# Patient Record
Sex: Male | Born: 1965 | Race: White | Hispanic: No | Marital: Married | Smoking: Current every day smoker
Health system: Southern US, Community
[De-identification: ages and names within clinical notes are randomized; demographics above are authoritative.]

## PROBLEM LIST (undated history)

## (undated) DIAGNOSIS — M1990 Unspecified osteoarthritis, unspecified site: Secondary | ICD-10-CM

## (undated) DIAGNOSIS — R7989 Other specified abnormal findings of blood chemistry: Secondary | ICD-10-CM

## (undated) DIAGNOSIS — E669 Obesity, unspecified: Secondary | ICD-10-CM

## (undated) DIAGNOSIS — E11621 Type 2 diabetes mellitus with foot ulcer: Secondary | ICD-10-CM

## (undated) DIAGNOSIS — E785 Hyperlipidemia, unspecified: Secondary | ICD-10-CM

## (undated) DIAGNOSIS — F17200 Nicotine dependence, unspecified, uncomplicated: Secondary | ICD-10-CM

## (undated) DIAGNOSIS — J189 Pneumonia, unspecified organism: Secondary | ICD-10-CM

## (undated) DIAGNOSIS — R55 Syncope and collapse: Secondary | ICD-10-CM

## (undated) DIAGNOSIS — I10 Essential (primary) hypertension: Secondary | ICD-10-CM

## (undated) DIAGNOSIS — I82890 Acute embolism and thrombosis of other specified veins: Secondary | ICD-10-CM

## (undated) DIAGNOSIS — L97909 Non-pressure chronic ulcer of unspecified part of unspecified lower leg with unspecified severity: Secondary | ICD-10-CM

## (undated) DIAGNOSIS — IMO0002 Reserved for concepts with insufficient information to code with codable children: Secondary | ICD-10-CM

## (undated) DIAGNOSIS — E1165 Type 2 diabetes mellitus with hyperglycemia: Secondary | ICD-10-CM

## (undated) DIAGNOSIS — I83209 Varicose veins of unspecified lower extremity with both ulcer of unspecified site and inflammation: Secondary | ICD-10-CM

## (undated) DIAGNOSIS — L03115 Cellulitis of right lower limb: Secondary | ICD-10-CM

## (undated) DIAGNOSIS — I8390 Asymptomatic varicose veins of unspecified lower extremity: Secondary | ICD-10-CM

## (undated) DIAGNOSIS — L97509 Non-pressure chronic ulcer of other part of unspecified foot with unspecified severity: Secondary | ICD-10-CM

## (undated) HISTORY — DX: Non-pressure chronic ulcer of unspecified part of unspecified lower leg with unspecified severity: L97.909

## (undated) HISTORY — DX: Non-pressure chronic ulcer of unspecified part of unspecified lower leg with unspecified severity: I83.209

---

## 1986-10-14 HISTORY — PX: APPENDECTOMY: SHX54

## 2001-09-26 ENCOUNTER — Emergency Department (HOSPITAL_COMMUNITY): Admission: EM | Admit: 2001-09-26 | Discharge: 2001-09-26 | Payer: Self-pay | Admitting: Emergency Medicine

## 2001-11-16 ENCOUNTER — Inpatient Hospital Stay (HOSPITAL_COMMUNITY): Admission: EM | Admit: 2001-11-16 | Discharge: 2001-11-18 | Payer: Self-pay | Admitting: *Deleted

## 2001-11-16 ENCOUNTER — Encounter: Payer: Self-pay | Admitting: *Deleted

## 2002-04-03 ENCOUNTER — Emergency Department (HOSPITAL_COMMUNITY): Admission: EM | Admit: 2002-04-03 | Discharge: 2002-04-03 | Payer: Self-pay | Admitting: Emergency Medicine

## 2002-04-04 ENCOUNTER — Emergency Department (HOSPITAL_COMMUNITY): Admission: EM | Admit: 2002-04-04 | Discharge: 2002-04-04 | Payer: Self-pay | Admitting: Emergency Medicine

## 2002-05-28 ENCOUNTER — Encounter: Payer: Self-pay | Admitting: Emergency Medicine

## 2002-05-29 ENCOUNTER — Inpatient Hospital Stay (HOSPITAL_COMMUNITY): Admission: EM | Admit: 2002-05-29 | Discharge: 2002-05-31 | Payer: Self-pay | Admitting: Emergency Medicine

## 2002-05-29 ENCOUNTER — Encounter: Payer: Self-pay | Admitting: Emergency Medicine

## 2002-12-17 ENCOUNTER — Encounter: Payer: Self-pay | Admitting: *Deleted

## 2002-12-17 ENCOUNTER — Emergency Department (HOSPITAL_COMMUNITY): Admission: EM | Admit: 2002-12-17 | Discharge: 2002-12-18 | Payer: Self-pay | Admitting: Emergency Medicine

## 2003-01-21 ENCOUNTER — Emergency Department (HOSPITAL_COMMUNITY): Admission: EM | Admit: 2003-01-21 | Discharge: 2003-01-21 | Payer: Self-pay | Admitting: Emergency Medicine

## 2003-01-21 ENCOUNTER — Encounter: Payer: Self-pay | Admitting: Emergency Medicine

## 2005-04-09 ENCOUNTER — Emergency Department (HOSPITAL_COMMUNITY): Admission: EM | Admit: 2005-04-09 | Discharge: 2005-04-09 | Payer: Self-pay | Admitting: Emergency Medicine

## 2005-07-17 ENCOUNTER — Emergency Department (HOSPITAL_COMMUNITY): Admission: EM | Admit: 2005-07-17 | Discharge: 2005-07-17 | Payer: Self-pay | Admitting: Emergency Medicine

## 2006-03-24 ENCOUNTER — Emergency Department (HOSPITAL_COMMUNITY): Admission: EM | Admit: 2006-03-24 | Discharge: 2006-03-24 | Payer: Self-pay | Admitting: Emergency Medicine

## 2006-03-24 ENCOUNTER — Encounter: Payer: Self-pay | Admitting: Vascular Surgery

## 2006-08-04 ENCOUNTER — Emergency Department (HOSPITAL_COMMUNITY): Admission: EM | Admit: 2006-08-04 | Discharge: 2006-08-04 | Payer: Self-pay | Admitting: *Deleted

## 2008-03-24 ENCOUNTER — Emergency Department (HOSPITAL_COMMUNITY): Admission: EM | Admit: 2008-03-24 | Discharge: 2008-03-24 | Payer: Self-pay | Admitting: Internal Medicine

## 2008-05-13 ENCOUNTER — Encounter: Admission: RE | Admit: 2008-05-13 | Discharge: 2008-05-13 | Payer: Self-pay | Admitting: Occupational Medicine

## 2008-08-24 ENCOUNTER — Ambulatory Visit: Payer: Self-pay | Admitting: Family Medicine

## 2008-08-24 ENCOUNTER — Inpatient Hospital Stay (HOSPITAL_COMMUNITY): Admission: EM | Admit: 2008-08-24 | Discharge: 2008-08-27 | Payer: Self-pay | Admitting: Emergency Medicine

## 2008-12-30 ENCOUNTER — Emergency Department (HOSPITAL_BASED_OUTPATIENT_CLINIC_OR_DEPARTMENT_OTHER): Admission: EM | Admit: 2008-12-30 | Discharge: 2008-12-30 | Payer: Self-pay | Admitting: Emergency Medicine

## 2009-01-02 ENCOUNTER — Emergency Department (HOSPITAL_COMMUNITY): Admission: EM | Admit: 2009-01-02 | Discharge: 2009-01-02 | Payer: Self-pay | Admitting: Emergency Medicine

## 2010-09-04 ENCOUNTER — Emergency Department (HOSPITAL_COMMUNITY): Admission: EM | Admit: 2010-09-04 | Discharge: 2010-09-04 | Payer: Self-pay | Admitting: Emergency Medicine

## 2010-09-10 ENCOUNTER — Inpatient Hospital Stay (HOSPITAL_COMMUNITY): Admission: EM | Admit: 2010-09-10 | Discharge: 2010-09-13 | Payer: Self-pay | Admitting: Emergency Medicine

## 2010-09-28 ENCOUNTER — Ambulatory Visit: Payer: Self-pay

## 2010-12-24 LAB — CBC
MCH: 28.9 pg (ref 26.0–34.0)
MCHC: 33.2 g/dL (ref 30.0–36.0)
Platelets: 254 10*3/uL (ref 150–400)

## 2010-12-24 LAB — GLUCOSE, CAPILLARY: Glucose-Capillary: 208 mg/dL — ABNORMAL HIGH (ref 70–99)

## 2010-12-25 LAB — GLUCOSE, CAPILLARY
Glucose-Capillary: 173 mg/dL — ABNORMAL HIGH (ref 70–99)
Glucose-Capillary: 204 mg/dL — ABNORMAL HIGH (ref 70–99)
Glucose-Capillary: 231 mg/dL — ABNORMAL HIGH (ref 70–99)
Glucose-Capillary: 249 mg/dL — ABNORMAL HIGH (ref 70–99)
Glucose-Capillary: 256 mg/dL — ABNORMAL HIGH (ref 70–99)
Glucose-Capillary: 275 mg/dL — ABNORMAL HIGH (ref 70–99)
Glucose-Capillary: 291 mg/dL — ABNORMAL HIGH (ref 70–99)
Glucose-Capillary: 319 mg/dL — ABNORMAL HIGH (ref 70–99)

## 2010-12-25 LAB — BASIC METABOLIC PANEL
BUN: 10 mg/dL (ref 6–23)
CO2: 29 mEq/L (ref 19–32)
Chloride: 101 mEq/L (ref 96–112)
Creatinine, Ser: 0.78 mg/dL (ref 0.4–1.5)
GFR calc non Af Amer: >60 mL/min (ref >60)
GFR calc non Af Amer: >60 mL/min (ref >60)
Glucose, Bld: 263 mg/dL — ABNORMAL HIGH (ref 70–99)
Glucose, Bld: 318 mg/dL — ABNORMAL HIGH (ref 70–99)
Potassium: 4.3 mEq/L (ref 3.5–5.1)
Sodium: 137 mEq/L (ref 135–145)

## 2010-12-25 LAB — CULTURE, BLOOD (ROUTINE X 2)
Culture  Setup Time: 201111282144
Culture: NO GROWTH

## 2010-12-25 LAB — CBC
HCT: 43 % (ref 39.0–52.0)
Hemoglobin: 13.5 g/dL (ref 13.0–17.0)
Hemoglobin: 15.1 g/dL (ref 13.0–17.0)
Hemoglobin: 15.4 g/dL (ref 13.0–17.0)
MCH: 29.3 pg (ref 26.0–34.0)
MCH: 30.6 pg (ref 26.0–34.0)
MCH: 30.6 pg (ref 26.0–34.0)
MCHC: 33.7 g/dL (ref 30.0–36.0)
MCHC: 35.1 g/dL (ref 30.0–36.0)
MCHC: 35.4 g/dL (ref 30.0–36.0)
MCV: 87.4 fL (ref 78.0–100.0)
Platelets: 256 10*3/uL (ref 150–400)
Platelets: 266 10*3/uL (ref 150–400)
RBC: 4.6 MIL/uL (ref 4.22–5.81)
RDW: 13.7 % (ref 11.5–15.5)
WBC: 10 10*3/uL (ref 4.0–10.5)
WBC: 9.9 10*3/uL (ref 4.0–10.5)

## 2010-12-25 LAB — HIV ANTIBODY (ROUTINE TESTING W REFLEX): HIV: NONREACTIVE

## 2010-12-25 LAB — COMPREHENSIVE METABOLIC PANEL
Alkaline Phosphatase: 65 U/L (ref 39–117)
BUN: 8 mg/dL (ref 6–23)
CO2: 28 mEq/L (ref 19–32)
Chloride: 100 mEq/L (ref 96–112)
GFR calc non Af Amer: >60 mL/min (ref >60)
Glucose, Bld: 277 mg/dL — ABNORMAL HIGH (ref 70–99)
Potassium: 3.9 mEq/L (ref 3.5–5.1)
Total Bilirubin: 0.5 mg/dL (ref 0.3–1.2)

## 2010-12-25 LAB — DIFFERENTIAL
Basophils Relative: 0 % (ref 0–1)
Basophils Relative: 0 % (ref 0–1)
Eosinophils Absolute: 0.4 10*3/uL (ref 0.0–0.7)
Eosinophils Relative: 3 % (ref 0–5)
Monocytes Absolute: 0.7 10*3/uL (ref 0.1–1.0)
Monocytes Absolute: 0.8 10*3/uL (ref 0.1–1.0)
Monocytes Relative: 5 % (ref 3–12)
Monocytes Relative: 7 % (ref 3–12)
Neutro Abs: 8.2 10*3/uL — ABNORMAL HIGH (ref 1.7–7.7)
Neutrophils Relative %: 70 % (ref 43–77)

## 2010-12-25 LAB — RAPID URINE DRUG SCREEN, HOSP PERFORMED
Amphetamines: NOT DETECTED
Opiates: POSITIVE — AB
Tetrahydrocannabinol: NOT DETECTED

## 2010-12-25 LAB — LIPID PANEL
HDL: 32 mg/dL — ABNORMAL LOW (ref >39)
VLDL: 67 mg/dL — ABNORMAL HIGH (ref 0–40)

## 2010-12-25 LAB — T3, FREE: T3, Free: 2.5 pg/mL (ref 2.3–4.2)

## 2010-12-25 LAB — TSH: TSH: 4.593 u[IU]/mL — ABNORMAL HIGH (ref 0.350–4.500)

## 2010-12-25 LAB — ETHANOL: Alcohol, Ethyl (B): <5 mg/dL (ref 0–10)

## 2011-01-24 LAB — DIFFERENTIAL
Basophils Relative: 0 % (ref 0–1)
Eosinophils Absolute: 0.2 10*3/uL (ref 0.0–0.7)
Eosinophils Relative: 2 % (ref 0–5)
Lymphs Abs: 2.6 10*3/uL (ref 0.7–4.0)
Neutrophils Relative %: 73 % (ref 43–77)

## 2011-01-24 LAB — CBC
Hemoglobin: 12.2 g/dL — ABNORMAL LOW (ref 13.0–17.0)
MCHC: 35 g/dL (ref 30.0–36.0)
RBC: 3.98 MIL/uL — ABNORMAL LOW (ref 4.22–5.81)
WBC: 12.6 10*3/uL — ABNORMAL HIGH (ref 4.0–10.5)

## 2011-01-24 LAB — GLUCOSE, CAPILLARY: Glucose-Capillary: 260 mg/dL — ABNORMAL HIGH (ref 70–99)

## 2011-01-24 LAB — COMPREHENSIVE METABOLIC PANEL
ALT: 24 U/L (ref 0–53)
AST: 19 U/L (ref 0–37)
Alkaline Phosphatase: 90 U/L (ref 39–117)
CO2: 28 mEq/L (ref 19–32)
Chloride: 106 mEq/L (ref 96–112)
GFR calc Af Amer: >60 mL/min (ref >60)
GFR calc non Af Amer: >60 mL/min (ref >60)
Glucose, Bld: 269 mg/dL — ABNORMAL HIGH (ref 70–99)
Potassium: 4.4 mEq/L (ref 3.5–5.1)
Sodium: 141 mEq/L (ref 135–145)
Total Bilirubin: 0.4 mg/dL (ref 0.3–1.2)

## 2011-02-26 NOTE — H&P (Signed)
Cody Harper, Cody Harper             ACCOUNT NO.:  [id number]   MEDICAL RECORD NO.:  [id number]          PATIENT TYPE:  INP   LOCATION:  5012                         FACILITY:  [facility]   PHYSICIAN:  Paula Compton, MD        DATE OF BIRTH:  [birth year]   DATE OF ADMISSION:  08/24/2008  DATE OF DISCHARGE:                              HISTORY & PHYSICAL   PRIMARY CARE PHYSICIAN:  None.   CHIEF COMPLAINT:  Right lower extremity wound and pain times one month.   HISTORY OF PRESENT ILLNESS:  The patient is a 45 year old male who  presents now with right lower extremity pain and chronic wound over the  past month.  The patient did not have any significant past medical  history and does not follow up with a primary care Cody Harper regularly  and endorses no medical problems.  The patient states that he sustained  minor trauma to his right lower extremity while mowing about one month  ago.  He thinks that a rock or an acorn or some other sort of debris hit  him in the leg and left a small wound.  He did not treat the wound, and  over the past month has become progressively more painful and ulcerated  with erythema and edema.  This has been progressive and has not acutely  worsened, but the sum total of his symptoms compelled the patient to  present to the emergency department today for evaluation.  The patient  states that he has been ambulating and going to work normally.   REVIEW OF SYSTEMS:  The patient reports that he has been tolerating oral  intake without difficulty.  He denies any nausea or vomiting.  Denies  any fevers or chills.  He reports normal bowel and bladder function.  He  states that he has never had a wound like this before, and he denies any  history of chronic lower extremity swelling.   ALLERGIES:  No known drug allergies.   PAST MEDICAL HISTORY:  None for the patient.  He is a half pack per day  smoker times 30 years.   FAMILY HISTORY:  The patient reports that his mom  died of breast cancer.  The patient denies any family history of diabetes, hypertension,  coronary artery disease.   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  The patient lives with his wife, 39-year-old son and father-in-law.  The  patient denies any alcohol.  Denies any drug use.  He smokes one half  pack per day for the last 30 years.   PHYSICAL EXAMINATION:  VITAL SIGNS:  Temperature 98.2, blood pressure  126/77, heart rate 67, respirations 18, oxygen saturation 97% on room  air.  GENERAL:  The patient is obese.  He is pleasant and he is alert.  HEENT:  Sclerae are clear.  Oropharynx is pink and moist without  erythema.  NECK:  Large circumference.  No carotid bruits on auscultation.  CARDIOVASCULAR:  No murmurs to  auscultation.  LUNGS:  Clear to auscultation bilaterally.  Work of breathing is  unlabored.  There are no wheezes, rales or rhonchi.  ABDOMEN:  Positive bowel sounds, soft, nontender.  No masses are  palpated.  EXTREMITIES:  Both lower extremities 2+ dorsalis pedis pulses.  No  cyanosis, clubbing or edema.  Right lower extremity is remarkable for a  1 x 2 cm ulcerated lesion just appearing to the medial malleolus.  There  is positive erythema associated with this area and also positive  erythema extending to the dorsum of the foot into the mid calf area.  There is a varicose vein near the site of this lesion which the patient  says has preexisted the onset of this wound.  Inside the lesion, there  is a dark necrotic tissue mixed with granulomatous tissue as well.  There is not actively draining and exhibits no active exudate.   LABORATORY DATA:  I-stat chemistry shows sodium 140, potassium 3.7,  chloride 103, Co2 28, BUN 16, creatinine 1.0, glucose elevated at 160.  White blood cell count 12.0, hematocrit 13.5, platelets 291 with 61%  neutrophils.  X-rays within  normal limits.  Right lower extremity films  2-view showed no bone involvement, but positive soft tissue swelling.   ASSESSMENT/PLAN:  The patient is a 45 year old male with no prior  medical history who presents with a right lower extremity wound, pain  and cellulitis for the last month.  1. Right lower extremity wound.  This wound looks very suspicious for      a nonhealing diabetic lower extremity ulcer.  The patient is obese      and certainly could have undiagnosed diabetes.  Given the location      of this wound, its chronicity and the patient's possible diabetes,      we will start IV vancomycin and Zosyn.  He is currently afebrile.      White blood cell count is 12 and no left shift.  Hopefully, we can      transition the patient to oral antibiotics quickly.  We will get a      wound care consult and obtain wound culture prior to administration      of antibiotics, although, it should be noted that the patient did      receive one dose of clindamycin in the emergency department.  2. Obesity.  The patient could have underlying diabetes.  We will      check an A1C and fasting lipid panel as he has had no previous      primary care.  Glucose is elevated in the emergency department.  We      will want to control his sugars in the setting of infection, and we      will initiate sliding scale insulin if necessary on this admission.  3. FENGI.  Analysis shows increased specific gravity 1.033.  Given the      patient's infection and dehydration, we will start normal saline at      100 cc an hour.  His electrolytes are currently stable.  4. Prophylaxis.  Lovenox subcu daily for DVT prophylaxis and PPI.   DISPOSITION:  Pending improvement in right lower extremity wound.  We  will follow up culture results.  The patient will likely need chronic  wound care and follow up with health care [name] after discharge.      Myrtie Soman, MD  Electronically Signed      Paula Compton, MD   Electronically  Signed    TE/MEDQ  D:  08/24/2008  T:  08/25/2008  Job:  [id number]

## 2011-02-26 NOTE — Discharge Summary (Signed)
NAME:  Cody Harper, Cody Harper             ACCOUNT NO.:  [id number]   MEDICAL RECORD NO.:  [id number]          PATIENT TYPE:  INP   LOCATION:  5012                         FACILITY:  [facility]   PHYSICIAN:  Cody Harper, M.D.    DATE OF BIRTH:  09-06-1966   DATE OF ADMISSION:  08/24/2008  DATE OF DISCHARGE:  08/27/2008                               DISCHARGE SUMMARY   PRIMARY CARE Cody Harper:  Cody Harper.   DISCHARGE DIAGNOSES:  1. Right lower extremity ulcer.  2. Newly diagnosed diabetes.  3. Obesity.   DISCHARGE MEDICATIONS:  1. Aspirin 81 mg daily.  2. Metformin 500 mg daily times 1 week then twice a day.  3. Percocet 5/325 mg 1 tablet q.4 to 6 hours as needed for pain.  4. Colace 100 mg twice a day for constipation while on pain      medications.  5. Ciprofloxacin 500 mg 3 times a day times 14 days.  6. Doxycycline 100 mg twice a day times 14 days.   CONSULT:  Dr. Lajoyce Harper of orthopedics/Dr. Carola Harper, orthopedics.  Also  consulted wound care.   PROCEDURES:  1. X-ray of the right tibia and fibula 2-view shows an open wound of      just above the medial malleolus with soft tissue edema of the level      of the knee, but no evidence for underlying bone involvement for      radio-opaque foreign body.  2. Two-view chest x-ray on November 11 shows no acute cardiopulmonary      disease.   LABS:  Wound culture grew Klebsiella oxytoca sensitive to ciprofloxacin,  resistant to ampicillin.  Hemoglobin A1c was 7.9.  Sed rate 18.  Lipid  profile shows total cholesterol of 184, triglycerides 259, HDL 25, LDL  107.  BMET on discharge shows sodium 139, potassium 4.4, glucose 171,  creatinine 0.97, BUN 13.   BRIEF HOSPITAL COURSE:  This is a 45 year old male who presents to the  ED with right lower extremity pain and chronic wound over the right  medial malleolus for 1 month.  1. Right lower extremity cellulitis.  The patient presents with a 1-      month history of a small trauma which was nonhealing  over the past      month and on presentation was a 2 x 3 cm ulcerated lesion.  X-ray      showed no bony involvement and no radiopaque foreign objects.  The      patient was treated with antibiotics and continued to improve.      Orthopedics was consulted who did not recommend any surgical      intervention.  Wound care recommended that the patient apply      alcohol dressing to wound daily.  The patient was discharged on      ciprofloxacin and Doxycycline, and will follow up with Dr. Lajoyce Harper in      the office.  2. Diabetes type 2.  The patient with obesity and nonhealing wound      which prompted search for insulin resistance.  The patient was  found to have hemoglobin A1c of 7.9.  The patient was started on      metformin 500 mg p.o. daily with plans to titrate up to b.i.d.  The      patient also received diabetes education while in the hospital.      The patient will follow up with family practice center for      continuing primary care.  The patient was started on aspirin 81 mg      daily.   DISCHARGE INSTRUCTIONS:  The patient to apply small piece of Aquacel to  wound daily and cover with gauze.  Moisten with water to remove  dressing.  The patient instructed to call family practice center if the  wound becomes worse or if he has problems with any medicines.  A note  was given for followup appointments.  The patient to call Dr. Lajoyce Harper at  [phone] for appointment.  The patient will return to see Dr. Earnest Harper at  [phone] on November 24 at 2:45 p.m.   DISCHARGE CONDITION:  Patient discharged to home in stable condition.      Cody Harness, MD  Electronically Signed      Cody Harper. Sheffield Harper, M.D.  Electronically Signed    Cody Harper/Cody Harper  D:  09/04/2008  T:  09/04/2008  Cody Harper:  Cody Harper   cc:   Cody Mustard, MD

## 2011-03-01 NOTE — Discharge Summary (Signed)
   NAME:  Cody Harper, Cody Harper                       ACCOUNT NO.:  [id number]   MEDICAL RECORD NO.:  [id number]                   PATIENT TYPE:  INP   LOCATION:  0378                                 FACILITY:  [facility]   PHYSICIAN:  Jerl Santos, M.D.              DATE OF BIRTH:  04-Apr-1966   DATE OF ADMISSION:  05/28/2002  DATE OF DISCHARGE:                                 DISCHARGE SUMMARY   HISTORY OF PRESENT ILLNESS:  The patient is a 45 year old man who presented  on May 29, 2002, with a 24-hour history of right leg redness which had  progressed rapidly.  There was associated fevers, chills and muscle ache.  He presented to the [facility] Emergency Room where a CT scan of  the chest and lower extremities was done, which was negative for DVT.  A  similar presentation had occurred in February.   Physical examination on admission revealed a  healthy-appearing gentleman  who complained of leg pain.  Examination of the right lower extremity revealed extensive cellulitis with  associated calf swelling.   HOSPITAL COURSE:  On admission, the patient was placed on a regimen of Ancef  2 g every eight hours.  Blood cultures were obtained, which were negative.  Blood sugars were monitored and Lotrimin cream was applied to the axillary  and groin areas as well as Topicort cream to the eczematous areas in the  right leg.  The patient's course was one of steady improvement.  By  May 31, 2002, it was felt reasonable to switch the patient to oral medications  and discharge.   DISCHARGE DIAGNOSES:  1. Right leg cellulitis.  2. Previous history of right leg cellulitis.  3. Eczematous dermatitis.   DISCHARGE MEDICATIONS:  1. Lotrimin 1% cream to the axillary and groin areas.  2. Topicort 0.25% cream to the anterior aspect of the leg.  3. Keflex 500 mg q.i.d. for seven additional days.   FOLLOW UP:  Appointment will be scheduled.                  Jerl Santos, M.D.    SWY/MEDQ  D:  05/31/2002  T:  05/31/2002  Job:  [phone]

## 2011-03-01 NOTE — Discharge Summary (Signed)
[facility]  Patient:    Cody Harper, Cody Harper Visit Number: [id number] MRN: [id number]          Service Type: MED Location: [phone] Attending Physician:  Madaline Guthrie Dictated by:   Marene Lenz, M.D. Admit Date:  11/16/2001 Discharge Date: 11/18/2001                             Discharge Summary  REASON FOR ADMISSION:  This is a 45 year old white male admitted with right lower extremity cellulitis.  The patient previously presented to ER with similar problem in December 2002.  At which time, per patient, the lesion was approximately 4 inches of diameter of erythematous lesion with a solitary small central scab.  At that time, was started on ten days of Amoxicillin. The patient reports to have taken full course but with no improvement. Over the past four days prior to admission, swelling, redness, itching and pain worsened to involve most of the right lower limb with a worsening of long standing varicose vein on inner leg of that leg as well.  Also the patient reports fevers of over 100 degrees, maximum of 102.5 degrees.  No recent history of trauma or recent tattoos.  No history of diabetes mellitus or vascular insufficiency.  REVIEW OF SYSTEMS:  As above, as well as increased cough.  PHYSICAL EXAMINATION:  GENERAL:  Slightly obese, white male, multiple tattoos, pleasant, minimal distress.  CARDIAC/PULMONARY/ABDOMEN:  Unremarkable.  EXTREMITIES:  Right lower extremity exquisitely tender to palpation, notably swollen including lower leg to foot.  Erythematous with multiple scabs of golden brown, slightly weeping. No palpable cords noted.  Slight decrease sensation to light touch on right foot compared to left.  Exquisitely tender and swollen inguinal lymph nodes on the right side noted.  HOSPITAL COURSE:  The patient was admitted to the floor.  IV antibiotics were started.  One gram Ancef IV q.8h. IV fluids were started as well as  elevation of the right lower extremity.  Chest x-ray was performed to evaluate cough and was normal.  UA was performed, positive for protein, value of 30.  On microscopic urine was negative.  The lower extremities were duplex Dopplered. No evidence of DVT.  Hepatitis panel was normal.  Fasting blood sugar within normal limits.  HIV test nonreactive.  CONDITION ON DISCHARGE:  Improved clinically and symptomatically after antibiotics begun. Decreased redness, decreased swelling, decreased pain per patient report in the right lower extremity. Also was notably less red compared to the marked borders on admission prior to discharge.  DISCHARGE INSTRUCTIONS:   The patient was discharged instructed to stay off feet as much as possible.  Returning to work on Monday if feasible.  He was switched from IV antibiotics to p.o. Keflex 500 mg t.i.d. x2 weeks.  FOLLOW-UP:  Scheduled for November 27, 2001 in the outpatient clinic with Estevan Ryder acting intern under Marene Lenz, M.D. Dictated by:   Marene Lenz, M.D. Attending Physician:  Madaline Guthrie DD:  11/19/01 TD:  11/20/01 Job: [id number]

## 2011-03-13 ENCOUNTER — Emergency Department (HOSPITAL_COMMUNITY): Payer: PRIVATE HEALTH INSURANCE

## 2011-03-13 ENCOUNTER — Encounter: Payer: Self-pay | Admitting: Internal Medicine

## 2011-03-13 ENCOUNTER — Observation Stay (HOSPITAL_COMMUNITY)
Admission: EM | Admit: 2011-03-13 | Discharge: 2011-03-14 | Disposition: A | Discharge status: Home / Self Care | Payer: PRIVATE HEALTH INSURANCE | Attending: Internal Medicine | Admitting: Internal Medicine

## 2011-03-13 DIAGNOSIS — M79609 Pain in unspecified limb: Secondary | ICD-10-CM

## 2011-03-13 DIAGNOSIS — E669 Obesity, unspecified: Secondary | ICD-10-CM | POA: Insufficient documentation

## 2011-03-13 DIAGNOSIS — I872 Venous insufficiency (chronic) (peripheral): Secondary | ICD-10-CM | POA: Insufficient documentation

## 2011-03-13 DIAGNOSIS — E119 Type 2 diabetes mellitus without complications: Secondary | ICD-10-CM | POA: Insufficient documentation

## 2011-03-13 DIAGNOSIS — L02419 Cutaneous abscess of limb, unspecified: Principal | ICD-10-CM | POA: Insufficient documentation

## 2011-03-13 LAB — BASIC METABOLIC PANEL
BUN: 11 mg/dL (ref 6–23)
CO2: 30 mEq/L (ref 19–32)
Calcium: 9.1 mg/dL (ref 8.4–10.5)
Chloride: 101 mEq/L (ref 96–112)
Creatinine, Ser: 0.68 mg/dL (ref 0.4–1.5)

## 2011-03-13 LAB — DIFFERENTIAL
Basophils Relative: 0 % (ref 0–1)
Eosinophils Absolute: 0.3 10*3/uL (ref 0.0–0.7)
Lymphs Abs: 3 10*3/uL (ref 0.7–4.0)
Monocytes Absolute: 0.6 10*3/uL (ref 0.1–1.0)
Monocytes Relative: 5 % (ref 3–12)
Neutrophils Relative %: 64 % (ref 43–77)

## 2011-03-13 LAB — GLUCOSE, CAPILLARY: Glucose-Capillary: 156 mg/dL — ABNORMAL HIGH (ref 70–99)

## 2011-03-13 LAB — CBC
Hemoglobin: 14.5 g/dL (ref 13.0–17.0)
MCH: 30.5 pg (ref 26.0–34.0)
MCHC: 35.7 g/dL (ref 30.0–36.0)
MCV: 85.5 fL (ref 78.0–100.0)
RBC: 4.75 MIL/uL (ref 4.22–5.81)

## 2011-03-13 MED ORDER — GADOBENATE DIMEGLUMINE 529 MG/ML IV SOLN
20.0000 mL | Freq: Once | INTRAVENOUS | Status: AC
  Administered 2011-03-13: 20 mL via INTRAVENOUS

## 2011-03-13 NOTE — Progress Notes (Signed)
Hospital Admission Note Date: 03/13/2011  Patient name: Cody Harper Medical record number: [id number] Date of birth: Nov 12, 1965 Age: 45 y.o. Gender: male PCP: No primary provider on file.  Medical Service: IMTS  Attending physician:  Dr. Rogelia Boga    Pager: Resident (R2/R3): Dr. Baltazar Apo    Pager: [phone] Resident (R1): Dr. Narda Bonds    Pager: [phone]  Chief Complaint: Right leg cellulitis  History of Present Illness: Patient is a 45 y/o man with h/o poorly controlled DM2, recurrent admissions for LE cellulitis (most recently in December 2011) and obesity presenting with persistent right lower extremity pain. According to the patient, he developed a small non-healing RLE wound/ulcer in the fall of last year after sustaining trauma to the area (was hit by a stone in the right mid shin while mowing the lawn). He was admitted and treated for a cellulitis then but has been lost to follow-up since. He states the ulcer never completely healed since then. Over the past 3-4 weeks he has noticed increased pain and drainage especially after extended periods of standing and after work. He states that the surrounding redness has actually improved. He denies any recent trauma, associated fever, chills, sob, chest pain, sweats, pain or swelling in any other extremities. He decided to come into the ED today because he wanted to make sure the infection has not spread to the bone.   Allergies: NKDA  Past Medical History: 1. Right lower extremity cellulitis extending from ankle up to the       knees with previous history of cellulitis in 2003, 2009, 2011 in the       same extremity.   2. Diabetes mellitus, poorly controlled with HbA1c in December 2011.  3. Varicose veins.   4. Obesity.   Social History: Patient is a Psychiatric nurse. He lives with his wife and daughter here in [place]. He currently smokes 1ppd and has been a smoker for 30 years. He denies any alcohol or drug use.  Family  History Noncontributory  Review of Systems: Pertinent items are noted in HPI.  Physical Exam:  Vitals: t 98, p 70, rr 18, bp 122/60, O2 sat 99% RA General: Vital signs reviewed and noted. Well-developed, well-nourished, in no acute distress; alert, appropriate and cooperative throughout examination.  Head: Normocephalic, atraumatic.  Ears: TM nonerythematous, not bulging, good light reflex bilaterally.  Nose: Mucous membranes moist, not inflammed, nonerythematous.  Throat: Oropharynx nonerythematous, no exudate appreciated.   Neck: No deformities, masses, or tenderness noted.  Lungs:  Normal respiratory effort. Clear to auscultation BL without crackles or wheezes.  Heart: RRR. S1 and S2 normal without gallop, murmur, or rubs.  Abdomen:  BS normoactive. Soft, Nondistended, non-tender.  No masses or organomegaly.  Extremities: RLE: there's a ~5cm stage II ulcer in the mid shin surrounded by an extensive area of redness and erythema that extends to the ankles and just below the knee. The ulcer is draining serous appearing material. The right leg is tender to palpation in the areas adjacent to the ulcer.     Lab results:  CBC:    Component Value Date/Time   WBC 10.8* 03/13/2011 1037   HGB 14.5 03/13/2011 1037   HCT 40.6 03/13/2011 1037   PLT 260 03/13/2011 1037   MCV 85.5 03/13/2011 1037   NEUTROABS 6.9 03/13/2011 1037   LYMPHSABS 3.0 03/13/2011 1037   MONOABS 0.6 03/13/2011 1037   EOSABS 0.3 03/13/2011 1037   BASOSABS 0.0 03/13/2011 1037   Basic Metabolic Panel:  Component Value Date/Time   NA 137 03/13/2011 1037   K 4.3 03/13/2011 1037   CL 101 03/13/2011 1037   CO2 30 03/13/2011 1037   BUN 11 03/13/2011 1037   CREATININE 0.68 03/13/2011 1037   GLUCOSE 207* 03/13/2011 1037   CALCIUM 9.1 03/13/2011 1037    Imaging results:   RIGHT TIBIA AND FIBULA - 2 VIEW    Comparison: CT 09/02/2010 and radiographs 09/04/2010    Findings: No acute osseous abnormality.    IMPRESSION:   No  acute osseous abnormality.   Assessment & Plan by Problem:  1. Non healing RLE ulcer with cellulitis - likely 2/2 poorly controlled diabetes. Hemodynamically stable, afebrile, no concern for disseminated infection at this point. - admit to regular bed for IV abx administration - Start Vanc and Ceftaz for pseudomonas given h/o DM. Transition to po once clinically improved. - Obtain wound cultures and blood cultures x2 (already receiving Vanc and Zosyn in the ED) - MRI of RLE to eval for any possible osteomyelitis - Wound care consult  2. Diabetes  - poorly controlled - Follow A1C - Check FLP for risk stratification - Continue home dose Lantus 20qhs plus SSI sensitive - needs outpatient follow up at the [facility], will need diabetes education as well  3. DVT ppx - Lovenox

## 2011-03-14 LAB — GLUCOSE, CAPILLARY: Glucose-Capillary: 210 mg/dL — ABNORMAL HIGH (ref 70–99)

## 2011-03-14 LAB — COMPREHENSIVE METABOLIC PANEL
ALT: 18 U/L (ref 0–53)
Albumin: 3.5 g/dL (ref 3.5–5.2)
Alkaline Phosphatase: 84 U/L (ref 39–117)
CO2: 30 mEq/L (ref 19–32)
Calcium: 9.2 mg/dL (ref 8.4–10.5)
GFR calc Af Amer: >60 mL/min (ref >60)
GFR calc non Af Amer: >60 mL/min (ref >60)
Glucose, Bld: 220 mg/dL — ABNORMAL HIGH (ref 70–99)
Sodium: 138 mEq/L (ref 135–145)
Total Bilirubin: 0.2 mg/dL — ABNORMAL LOW (ref 0.3–1.2)

## 2011-03-14 LAB — CBC
HCT: 41.9 % (ref 39.0–52.0)
Hemoglobin: 14.6 g/dL (ref 13.0–17.0)
MCHC: 34.8 g/dL (ref 30.0–36.0)
Platelets: 272 10*3/uL (ref 150–400)

## 2011-03-14 LAB — HEMOGLOBIN A1C: Mean Plasma Glucose: 240 mg/dL — ABNORMAL HIGH (ref <117)

## 2011-03-14 LAB — LIPID PANEL
HDL: 30 mg/dL — ABNORMAL LOW (ref >39)
LDL Cholesterol: 126 mg/dL — ABNORMAL HIGH (ref 0–99)

## 2011-03-16 LAB — WOUND CULTURE: Gram Stain: NONE SEEN

## 2011-03-19 LAB — CULTURE, BLOOD (ROUTINE X 2)
Culture  Setup Time: 201205302031
Culture  Setup Time: 201205302031
Culture: NO GROWTH

## 2011-03-20 ENCOUNTER — Encounter: Payer: Self-pay | Admitting: Internal Medicine

## 2011-03-20 ENCOUNTER — Ambulatory Visit (INDEPENDENT_AMBULATORY_CARE_PROVIDER_SITE_OTHER): Payer: PRIVATE HEALTH INSURANCE | Admitting: Internal Medicine

## 2011-03-20 DIAGNOSIS — E1169 Type 2 diabetes mellitus with other specified complication: Secondary | ICD-10-CM

## 2011-03-20 DIAGNOSIS — E11621 Type 2 diabetes mellitus with foot ulcer: Secondary | ICD-10-CM

## 2011-03-20 DIAGNOSIS — IMO0001 Reserved for inherently not codable concepts without codable children: Secondary | ICD-10-CM

## 2011-03-20 DIAGNOSIS — I8390 Asymptomatic varicose veins of unspecified lower extremity: Secondary | ICD-10-CM

## 2011-03-20 DIAGNOSIS — E1165 Type 2 diabetes mellitus with hyperglycemia: Secondary | ICD-10-CM

## 2011-03-20 DIAGNOSIS — IMO0002 Reserved for concepts with insufficient information to code with codable children: Secondary | ICD-10-CM

## 2011-03-20 DIAGNOSIS — L97509 Non-pressure chronic ulcer of other part of unspecified foot with unspecified severity: Secondary | ICD-10-CM

## 2011-03-20 HISTORY — DX: Type 2 diabetes mellitus with foot ulcer: E11.621

## 2011-03-20 HISTORY — DX: Type 2 diabetes mellitus with hyperglycemia: E11.65

## 2011-03-20 HISTORY — DX: Asymptomatic varicose veins of unspecified lower extremity: I83.90

## 2011-03-20 HISTORY — DX: Reserved for concepts with insufficient information to code with codable children: IMO0002

## 2011-03-20 LAB — GLUCOSE, CAPILLARY: Glucose-Capillary: 278 mg/dL — ABNORMAL HIGH (ref 70–99)

## 2011-03-20 MED ORDER — INSULIN GLARGINE 100 UNIT/ML ~~LOC~~ SOLN: SUBCUTANEOUS | Status: DC

## 2011-03-20 MED ORDER — METFORMIN HCL 500 MG PO TABS: 500.0000 mg | ORAL_TABLET | Freq: Two times a day (BID) | ORAL | Status: DC

## 2011-03-20 MED ORDER — DOXYCYCLINE HYCLATE 100 MG PO TABS: 100.0000 mg | ORAL_TABLET | Freq: Two times a day (BID) | ORAL | Status: AC

## 2011-03-20 NOTE — Assessment & Plan Note (Signed)
Right leg varicose veins, which complicates the ulcer. Advised on care. He wont be able to return to work atleast for 7-10 days as his work require steel toe long boots and given his ulcer and edema, it will be hard to do. He also have significant pain if he stands on that feet for >1 hr.

## 2011-03-20 NOTE — Assessment & Plan Note (Addendum)
Increase insulin to 25 units. Encouraged testing and better control. He was only taking metformin 500mg  one tablet once daily. I asked him to make it bid as prescribed.

## 2011-03-20 NOTE — Progress Notes (Signed)
  Subjective:    Patient ID: Cody Harper, male    DOB: 1966-03-08, 45 y.o.   MRN: [id number]  HPI 45 years old male with recent hospitalisation for recurrent cellulitis/ulcer in the right leg. He is diabetic, who also has vericose vein on right side. He developed ulcer, oozing, discharge, fever and weakness about two weeks ago. He hand an MRI which did not show any evidence of deep tissue infection. Wound culture grew tetra and bactrim sensitive MRSA.  He is doing better now but the ulcer is still open and draining. The edema has decreased but still significant. He has significant itching in the surrounding area and some skin discoloration. He denies fever, nausea and vomiting.    Review of Systems  All other systems reviewed and are negative.       Objective:   Physical Exam  Constitutional: He is oriented to person, place, and time. He appears well-developed and well-nourished.  HENT:  Head: Normocephalic and atraumatic.  Right Ear: External ear normal.  Left Ear: External ear normal.  Eyes: Conjunctivae and EOM are normal. Pupils are equal, round, and reactive to light. Right eye exhibits no discharge. Left eye exhibits no discharge.  Neck: Normal range of motion. Neck supple. No thyromegaly present.  Cardiovascular: Normal rate and regular rhythm.   No murmur heard.      Right leg edema  Pulmonary/Chest: Effort normal and breath sounds normal. No respiratory distress. He has no wheezes. He has no rales.  Abdominal: Soft. Bowel sounds are normal. He exhibits no distension and no mass. There is no tenderness. There is no rebound and no guarding.  Musculoskeletal: Normal range of motion.  Neurological: He is alert and oriented to person, place, and time. He has normal reflexes. No cranial nerve deficit. Coordination normal.  Skin: Lesion noted. No rash noted. He is not diaphoretic. No erythema.          Red blotching, discoloration from varicose veins. Open ulcer of about 1 cm,  circular, pink-white base, rolled edges. Also has bullae about 1 cm size near the ulcer.    Psychiatric: He has a normal mood and affect. His behavior is normal. Judgment and thought content normal.          Assessment & Plan:

## 2011-03-20 NOTE — Patient Instructions (Signed)
Return in one week. Follow up with the wound care.  Start taking metformin twice daily, and increase your insulin dose.

## 2011-03-20 NOTE — Progress Notes (Signed)
Brief Hospital Course/Discharge Note  1. Pt was admitted for recurrent RLE cellulitis extending from an area of a non healing ulcer since December 2011. This is all likely 2/2 poorly controlled DM from lack of hospital or clinic FU.  During his overnight admission, he was treated with IV Vanc + Ceftazidime. MRI was negative for osteomyelitis. Wound cultures were pending on discharge however are back now with sensitivities listed in EMR.   2. For his DM, his Hb A1C was elevated to 10.1. He was discharged on his home dose of Lantus 20units qhs but was also started on Metformin 500mg  BID.  3. FLP was pending at time of discharge, but LDL now found to be elevated at 126. CMP wnl and he will need to be started on a statin.  Discharge medications entered as above.   To do at clinic f/u - please adjust patients abx according to sensitivity data listed on wound cx result. He was sent home on Doxycycline to complete a 10 day course (start date 03/13/11). - DM management with referral to [facility] for dm education. - start on a Statin - wound care

## 2011-03-20 NOTE — Assessment & Plan Note (Signed)
Diabetic foot ulcer, also worsened by varicose vein. Referral for wound care. Continue antibiotics for one more week. Asked to elevate leg. Use benadryl. Also enforced tighter control of his DM. He is worried about loosing his leg. The wound culture grew MRSA which are sensitive to doxy. REturn in one week

## 2011-03-28 ENCOUNTER — Encounter (HOSPITAL_BASED_OUTPATIENT_CLINIC_OR_DEPARTMENT_OTHER): Payer: PRIVATE HEALTH INSURANCE | Attending: Internal Medicine

## 2011-03-28 DIAGNOSIS — L02419 Cutaneous abscess of limb, unspecified: Secondary | ICD-10-CM | POA: Insufficient documentation

## 2011-03-28 DIAGNOSIS — L97809 Non-pressure chronic ulcer of other part of unspecified lower leg with unspecified severity: Secondary | ICD-10-CM | POA: Insufficient documentation

## 2011-03-28 DIAGNOSIS — I8390 Asymptomatic varicose veins of unspecified lower extremity: Secondary | ICD-10-CM | POA: Insufficient documentation

## 2011-03-28 DIAGNOSIS — Z794 Long term (current) use of insulin: Secondary | ICD-10-CM | POA: Insufficient documentation

## 2011-03-28 DIAGNOSIS — I872 Venous insufficiency (chronic) (peripheral): Secondary | ICD-10-CM | POA: Insufficient documentation

## 2011-03-28 DIAGNOSIS — E119 Type 2 diabetes mellitus without complications: Secondary | ICD-10-CM | POA: Insufficient documentation

## 2011-03-28 DIAGNOSIS — L97309 Non-pressure chronic ulcer of unspecified ankle with unspecified severity: Secondary | ICD-10-CM | POA: Insufficient documentation

## 2011-03-29 ENCOUNTER — Encounter: Payer: PRIVATE HEALTH INSURANCE | Admitting: Internal Medicine

## 2011-03-29 NOTE — Assessment & Plan Note (Unsigned)
[facility]  NAME:  Cody Harper, Cody Harper             ACCOUNT NO.:  [id number]  MEDICAL RECORD NO.:  [id number]      DATE OF BIRTH:  [birth year]  PHYSICIAN:  Maxwell Caul, M.D. VISIT DATE:  03/28/2011                                  OFFICE VISIT   ADDENDUM  To the patient's wounds we applied silver foam with TCA and zinc to the normal skin.  We put him in a Profore Lite dressing.  It is likely after these heal that he will require a Vascular Surgery consultation for consideration of ablation to relieve the venous hypertension in this area that seems to be precipitating the recurrent ulceration plus or minus cellulitis.  We will see him again in a week's time.          ______________________________ Maxwell Caul, M.D.     MGR/MEDQ  D:  03/28/2011  T:  03/29/2011  Job:  [id number]

## 2011-03-29 NOTE — Assessment & Plan Note (Unsigned)
[facility]  NAME:  Cody Harper, Cody Harper NO.:  [id number]  MEDICAL RECORD NO.:  [id number]      DATE OF BIRTH:  October 14, 1966  PHYSICIAN:  Maxwell Caul, M.D. VISIT DATE:  03/28/2011                                  OFFICE VISIT   Cody Harper comes to [place] after a brief admission to hospital from Mar 13, 2011, to Mar 14, 2011, at [facility] with a nonhealing right lower extremity ulcer.  He was treated for cellulitis surrounding this wound and he has been taking doxycycline 100 mg 1 tablet by mouth twice a day, which he is finishing up on.  The patient is a type  diabetic on Lantus 20 units subcutaneously at bedtime and metformin.  He tells me that he had wounds on the similar area dating back to September 2011. The cellulitis at that time was much more intense and I believe he required hospitalization, although I do not have any of these records. He tells me that after this he was left with an ulcer that never really healed and he has been left getting this secondarily infected recently. He also tells me that several years ago he had an ulcer in the same area, which eventually closed over.  PAST MEDICAL HISTORY: 1. Type 2 diabetes. 2. 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 3. History of right lower extremity cellulitis with a previous history     of cellulitis in 2011 and 2009.  PROCEDURES:  He had a plain film of the right tibia and fibula showed no acute osseous abnormality.  An MRI of the right tibia and fibula showed recurrent cellulitis without evidence of soft tissue abscess, myofasciitis, or osteomyelitis.  REVIEW OF SYSTEMS:  RESPIRATORY:  He is not short of breath.  CARDIAC: He has no cardiac history that I am aware of.  EXTREMITIES:  It sounds as though he has a significant area of stasis dermatitis over a large area of his anteromedial right leg.  He  does not describe claudication.  PHYSICAL EXAMINATION:  His temperature is 98.5, pulse 86, respirations 20, blood pressure 125/86.  Blood glucose 233.  There were 2 areas on the right leg, one on the right medial leg measuring 2 x 1.5 x 0.2. This was in the medial aspect about the mid aspect.  He also had one on the right medial ankle measuring 1 x 0.5 x 0.2.  Both of these required non-excisional debridements.  This was done with difficulty due to pain issues.  There is surrounding erythema over a large area of this right leg; however, there is no warmth.  No tenderness and I really do not think there is any evidence of current cellulitis.  He does have marked varicosities in the area.  All of this is indicative of significant venous hypertension in this area.  Peripheral pulses are intact.  IMPRESSION:  Severe venous stasis with varicose veins and venous stasis ulcerations.  These were debrided as noted above.  We applied zinc, TCA, foam, and a Profore Lite dressing.  I did not think he needed additional antibiotic therapy.          ______________________________ Maxwell Caul, M.D.  MGR/MEDQ  D:  03/28/2011  T:  03/29/2011  Job:  [id number]

## 2011-04-10 NOTE — Discharge Summary (Signed)
NAMESUZANNE, Cody Harper NO.:  [id number]  MEDICAL RECORD NO.:  [id number]  LOCATION:  5007                         FACILITY:  [facility]  PHYSICIAN:  Blanch Media, M.D.DATE OF BIRTH:  [birth year]  DATE OF ADMISSION:  03/13/2011 DATE OF DISCHARGE:  03/14/2011                              DISCHARGE SUMMARY   DISCHARGE DIAGNOSES: 1. Nonhealing right lower extremity ulcer with cellulitis. 2. Poorly controlled diabetes. 3. History of varicose vein. 4. Obesity. 5. History of right lower extremity cellulitis extending from ankle up     to the knee with previous history of cellulitis in 2003, 2009, 2011     in the same extremity.  DISCHARGE MEDICATIONS: 1. Doxycycline 100 mg, take 1 tablet by mouth twice a day for the next     9 days. 2. Metformin 500 mg tablets to take 1 tablet 2 times a day with meals. 3. Lantus, inject 20 units subcutaneously at bedtime. 4. Benadryl 25 mg tablets, take 1 tablet by mouth every 4 hours as     needed for itching.  DISPOSITION AND FOLLOWUP:  Cody Harper is to follow up at the [facility] on March 20, 2011.  At the appointment, please assess for any improvement of his cellulitis.  Also the patient will need aggressive diabetes control as well as referral to [facility] for diabetes education.  Please review results of the wound culture and switch antibiotics as appropriate.  PROCEDURES PERFORMED: 1. Plain film of the right tibia and fibula on Mar 13, 2011 showed no     acute osseous abnormality. 2. An MRI of the right tibia and fibula showed possible residual or     recurrent cellulitis anteriorly in the right lower leg.  No soft     tissue abscess or myofasciitis and was treated.  No evidence of     osteomyelitis.  Grossly stable appearance of multiple superficial     varicosities medially in the right lower leg.  No definite signs of     acute superficial thrombophlebitis.  CONSULTATIONS:  None.  BRIEF  ADMISSION HISTORY AND PHYSICAL:  The patient is a 45 year old male with history of poorly controlled type 2 diabetes, recurrent admissions for lower extremity cellulitis (most recently in December 2011) and obesity presented with persistent right lower extremity pain.  According to the patient, he developed a small nonhealing right lower extremity wound/ulcer in the fall of last year after sustaining trauma to the area (was hit by a stone in the right mid shin while mowing the lawn).  He was admitted and treated for cellulitis but then has been lost to follow up since then.  He states the ulcer never completely healed since then. Over the past 3-4 weeks, however, the patient noticed increasing pain and drainage, especially after extended periods of standing and after work.  He states the surrounding redness has actually improved.  He denies any recent trauma, associated fever, chills, shortness of breath, chest pain, sweats, pain, or swelling of any other extremities.  The patient had come to the ED because he wanted to make sure the infection has not spread to bone.  INITIAL PHYSICAL EXAMINATION:  VITAL SIGNS:  Temperature  98, pulse 70, respirations 18, blood pressure 122/60, oxygen saturation 99% on room air. GENERAL:  The patient was well-developed, well-nourished in no acute distress. HEENT:  Head:  Normocephalic, atraumatic.  Throat:  Oropharynx nonerythematous.  .  Mucosal membranes moist. NECK:  No deformities, masses, or tenderness. LUNGS:  Normal respiratory effort.  Clear to auscultation bilaterally. HEART:  Regular rate and rhythm.  S1, S2 normal. ABDOMEN:  Bowel sounds normoactive, soft and nondistended and nontender. EXTREMITIES:  In the right lower extremity, he has a 5 x 5 cm stage II ulcer in the mid shin which is surrounded by an extensive area of redness and erythema that extends to the ankles and just below the knee. He also has drained serous appearing material.   The right leg is tender to palpation in the area that is adjacent to the ulcer.  INITIAL LAB DATA:  White blood cell 10.8, hemoglobin 14.5, platelets 260,000.  Sodium 137, potassium 4.3, chloride 101, bicarb 30, BUN 11, creatinine 0.68, blood glucose 207, calcium 9.1.  BRIEF HOSPITAL COURSE:  The patient was admitted to the Internal Medicine Teaching Service for evaluation of a nonhealing right lower extremity ulcer with surrounding cellulitis and this was felt to be most likely secondary to poorly controlled diabetes.  On admission, the patient was hemodynamically stable, afebrile, and there was no concern for disseminated infection during the point of admission.  MRI was also negative for osteomyelitis.  The patient was admitted to the floor for overnight observation and for IV antibiotic administration for which he received vancomycin and ceftazidime for Pseudomonas coverage.  During the patient's admission, he continued to remain clinically and hemodynamically stable and was in improved condition by the following morning.  Wound Care was also consulted and they recommended dry and wet dressing changes on a daily basis.  Wound cultures and blood cultures were obtained with the wound culture showing a few methicillin- resistant staph aureus.  Sensitivities have also been reported and this will need to be reviewed with the patient on his outpatient followup as the patient might need to be switched from his doxycycline to new medication depending on the sensitivities as listed in the EMR.  Blood cultures were also obtained and these were negative for any microbial growth.  During the patient's hospitalization, hemoglobin A1c was obtained and was elevated at 10.  As a result, we believe that the patient's continued nonhealing ulcers are secondary to his poorly controlled diabetes.  During his hospitalization, he was started on metformin 500 mg b.i.d. and he is to continue this in  addition to his normal dose of Lantus at 20 units at bedtime on a daily basis.  Again the patient will need a referral to [facility] Neighbor, diabetes educator upon his followup visit.  The patient's lipid panel showed hyperlipidemia and for this the patient will need to be started on a statin during his outpatient followup.  A CMET was obtained and this was within normal limits.  DISCHARGE VITAL SIGNS:  Temperature 98.2, pulse 76, respirations 18, blood pressure 123/78, oxygen saturation 99% on room air.  DISCHARGE LABS:  Hemoglobin A1c of 10.  Lipid profile as follows:  Total cholesterol 231, triglycerides 374, HDL 30, LDL 126.  Sodium 138, potassium 4.9, chloride 100, bicarb 30, BUN 12, creatinine 0.78, blood glucose 220, white blood cell 10.6, hemoglobin 14.6, platelets 372,000. Blood cultures negative x2.  Wound cultures showing few MRSA.    ______________________________ Jaci Lazier, MD   ______________________________ Blanch Media, M.D.  NI/MEDQ  D:  03/19/2011  T:  03/20/2011  Job:  [id number]  cc:   [facility]  Electronically Signed by Jaci Lazier MD on 03/29/2011 01:05:25 AM Electronically Signed by Blanch Media M.D. on 04/10/2011 11:56:30 AM

## 2011-04-15 ENCOUNTER — Encounter: Payer: PRIVATE HEALTH INSURANCE | Admitting: Vascular Surgery

## 2011-04-19 ENCOUNTER — Encounter (HOSPITAL_BASED_OUTPATIENT_CLINIC_OR_DEPARTMENT_OTHER): Payer: PRIVATE HEALTH INSURANCE | Attending: Internal Medicine

## 2011-04-19 DIAGNOSIS — L97809 Non-pressure chronic ulcer of other part of unspecified lower leg with unspecified severity: Secondary | ICD-10-CM | POA: Insufficient documentation

## 2011-04-19 DIAGNOSIS — Z794 Long term (current) use of insulin: Secondary | ICD-10-CM | POA: Insufficient documentation

## 2011-04-19 DIAGNOSIS — I872 Venous insufficiency (chronic) (peripheral): Secondary | ICD-10-CM | POA: Insufficient documentation

## 2011-04-19 DIAGNOSIS — L03119 Cellulitis of unspecified part of limb: Secondary | ICD-10-CM | POA: Insufficient documentation

## 2011-04-19 DIAGNOSIS — L97309 Non-pressure chronic ulcer of unspecified ankle with unspecified severity: Secondary | ICD-10-CM | POA: Insufficient documentation

## 2011-04-19 DIAGNOSIS — E119 Type 2 diabetes mellitus without complications: Secondary | ICD-10-CM | POA: Insufficient documentation

## 2011-04-19 DIAGNOSIS — I8390 Asymptomatic varicose veins of unspecified lower extremity: Secondary | ICD-10-CM | POA: Insufficient documentation

## 2011-04-19 DIAGNOSIS — L02419 Cutaneous abscess of limb, unspecified: Secondary | ICD-10-CM | POA: Insufficient documentation

## 2011-05-02 ENCOUNTER — Encounter: Payer: Self-pay | Admitting: Vascular Surgery

## 2011-05-14 ENCOUNTER — Encounter: Payer: PRIVATE HEALTH INSURANCE | Admitting: Vascular Surgery

## 2011-05-14 NOTE — Progress Notes (Unsigned)
Subjective:     Patient ID: Cody Harper, male   DOB: November 19, 1965, 45 y.o.   MRN: [id number]  HPI   Review of Systems     Objective:   Physical Exam     Assessment:     ***    Plan:     ***

## 2011-05-14 NOTE — Progress Notes (Deleted)
Subjective:     Patient ID: Cody Harper, male   DOB: Sep 23, 1966, 45 y.o.   MRN: [id number]  HPI   Review of Systems  Constitutional: Negative for fever, chills and appetite change.  HENT: Negative for hearing loss, nosebleeds, congestion, sore throat, trouble swallowing and voice change.   Eyes: Negative for photophobia and visual disturbance.  Respiratory: Negative for cough, chest tightness, shortness of breath and wheezing.   Cardiovascular: Negative for chest pain, palpitations and leg swelling.  Gastrointestinal: Negative for nausea, vomiting, abdominal pain, diarrhea, constipation, blood in stool and abdominal distention.  Genitourinary: Negative for urgency, frequency, hematuria and difficulty urinating.  Musculoskeletal: Negative for back pain, joint swelling, arthralgias and gait problem.  Skin: Negative for pallor, rash and wound.  Neurological: Negative for dizziness, syncope, facial asymmetry, speech difficulty, weakness, light-headedness, numbness and headaches.  Hematological: Negative for adenopathy.  Psychiatric/Behavioral: Negative for confusion.       Objective:   Physical Exam  Constitutional: He is oriented to person, place, and time. He appears well-developed and well-nourished. No distress.  HENT:  Head: Normocephalic.  Mouth/Throat: Oropharynx is clear and moist.  Eyes: EOM are normal.  Neck: Normal range of motion. Neck supple.  Cardiovascular: Normal rate, regular rhythm and intact distal pulses.   No murmur heard. Pulmonary/Chest: Effort normal and breath sounds normal. No respiratory distress. He has no wheezes. He has no rales.  Abdominal: Soft. He exhibits no distension and no mass. There is no tenderness. There is no rebound and no guarding.  Musculoskeletal: Normal range of motion. He exhibits no edema.  Lymphadenopathy:    He has no cervical adenopathy.  Neurological: He is alert and oriented to person, place, and time. Coordination normal.    Skin: Skin is warm and dry. No rash noted.  Psychiatric: His behavior is normal.       Assessment:     ***    Plan:     ***

## 2011-05-14 NOTE — Progress Notes (Deleted)
Subjective:     Patient ID: Cody Harper, male   DOB: [birth year], 45 y.o.   MRN: [id number]  HPI   Review of Systems Past Medical History  Diagnosis Date  . Diabetes mellitus   . Varicose veins with ulcer and inflammation     Right leg   History   Social History  . Marital Status: Married    Spouse Name: N/A    Number of Children: N/A  . Years of Education: N/A   Social History Main Topics  . Smoking status: Current Everyday Smoker -- 1.0 packs/day for 30 years    Types: Cigarettes  . Smokeless tobacco: Not on file  . Alcohol Use: No  . Drug Use: No  . Sexually Active: Not on file     [employer] worker, operates trip   Other Topics Concern  . Not on file   Social History Narrative  . No narrative on file   Family History  Problem Relation Age of Onset  . Cancer Mother         Objective:   Physical Exam     Assessment:     ***    Plan:     ***

## 2011-05-17 ENCOUNTER — Encounter (HOSPITAL_BASED_OUTPATIENT_CLINIC_OR_DEPARTMENT_OTHER): Payer: PRIVATE HEALTH INSURANCE | Attending: General Surgery

## 2011-05-17 DIAGNOSIS — E119 Type 2 diabetes mellitus without complications: Secondary | ICD-10-CM | POA: Insufficient documentation

## 2011-05-17 DIAGNOSIS — L97309 Non-pressure chronic ulcer of unspecified ankle with unspecified severity: Secondary | ICD-10-CM | POA: Insufficient documentation

## 2011-05-17 DIAGNOSIS — L97809 Non-pressure chronic ulcer of other part of unspecified lower leg with unspecified severity: Secondary | ICD-10-CM | POA: Insufficient documentation

## 2011-05-17 DIAGNOSIS — I872 Venous insufficiency (chronic) (peripheral): Secondary | ICD-10-CM | POA: Insufficient documentation

## 2011-05-17 DIAGNOSIS — I8310 Varicose veins of unspecified lower extremity with inflammation: Secondary | ICD-10-CM | POA: Insufficient documentation

## 2011-06-21 ENCOUNTER — Encounter (HOSPITAL_BASED_OUTPATIENT_CLINIC_OR_DEPARTMENT_OTHER): Payer: PRIVATE HEALTH INSURANCE

## 2011-07-01 ENCOUNTER — Emergency Department (HOSPITAL_COMMUNITY)
Admission: EM | Admit: 2011-07-01 | Discharge: 2011-07-01 | Disposition: A | Discharge status: Home / Self Care | Payer: PRIVATE HEALTH INSURANCE | Attending: Emergency Medicine | Admitting: Emergency Medicine

## 2011-07-01 ENCOUNTER — Emergency Department (HOSPITAL_COMMUNITY): Payer: PRIVATE HEALTH INSURANCE

## 2011-07-01 DIAGNOSIS — L97209 Non-pressure chronic ulcer of unspecified calf with unspecified severity: Secondary | ICD-10-CM | POA: Insufficient documentation

## 2011-07-01 DIAGNOSIS — E1169 Type 2 diabetes mellitus with other specified complication: Secondary | ICD-10-CM | POA: Insufficient documentation

## 2011-07-01 DIAGNOSIS — R0602 Shortness of breath: Secondary | ICD-10-CM | POA: Insufficient documentation

## 2011-07-01 DIAGNOSIS — R509 Fever, unspecified: Secondary | ICD-10-CM | POA: Insufficient documentation

## 2011-07-01 DIAGNOSIS — J40 Bronchitis, not specified as acute or chronic: Secondary | ICD-10-CM | POA: Insufficient documentation

## 2011-07-01 LAB — DIFFERENTIAL
Basophils Absolute: 0 10*3/uL (ref 0.0–0.1)
Eosinophils Absolute: 0 10*3/uL (ref 0.0–0.7)
Lymphocytes Relative: 8 % — ABNORMAL LOW (ref 12–46)
Lymphs Abs: 1.1 10*3/uL (ref 0.7–4.0)
Neutrophils Relative %: 82 % — ABNORMAL HIGH (ref 43–77)

## 2011-07-01 LAB — CBC
HCT: 39.7 % (ref 39.0–52.0)
MCV: 86.7 fL (ref 78.0–100.0)
Platelets: 215 10*3/uL (ref 150–400)
RBC: 4.58 MIL/uL (ref 4.22–5.81)
WBC: 13.6 10*3/uL — ABNORMAL HIGH (ref 4.0–10.5)

## 2011-07-01 LAB — POCT I-STAT, CHEM 8
Chloride: 99 mEq/L (ref 96–112)
HCT: 42 % (ref 39.0–52.0)
Hemoglobin: 14.3 g/dL (ref 13.0–17.0)
Potassium: 3.8 mEq/L (ref 3.5–5.1)

## 2011-07-02 ENCOUNTER — Ambulatory Visit: Payer: PRIVATE HEALTH INSURANCE | Admitting: Internal Medicine

## 2011-07-03 ENCOUNTER — Encounter: Payer: PRIVATE HEALTH INSURANCE | Admitting: Internal Medicine

## 2011-07-16 LAB — CBC
HCT: 36.6 — ABNORMAL LOW
HCT: 37 — ABNORMAL LOW
HCT: 39.9
Hemoglobin: 12.6 — ABNORMAL LOW
Hemoglobin: 13.5
MCHC: 34.4
MCV: 89.7
MCV: 89.8
Platelets: 224
Platelets: 236
RBC: 4.08 — ABNORMAL LOW
RBC: 4.48
RDW: 13.4
RDW: 13.4
RDW: 13.6
WBC: 8.4

## 2011-07-16 LAB — BASIC METABOLIC PANEL
BUN: 11
BUN: 13
BUN: 17
CO2: 31
Calcium: 8.9
Calcium: 9.1
Chloride: 102
Chloride: 102
Creatinine, Ser: 0.97
Creatinine, Ser: 1.04
GFR calc Af Amer: >60
GFR calc non Af Amer: >60
GFR calc non Af Amer: >60
Glucose, Bld: 171 — ABNORMAL HIGH
Glucose, Bld: 181 — ABNORMAL HIGH
Potassium: 3.7
Potassium: 3.9
Potassium: 4.4
Sodium: 139

## 2011-07-16 LAB — URINALYSIS, ROUTINE W REFLEX MICROSCOPIC
Glucose, UA: NEGATIVE
Nitrite: NEGATIVE
Specific Gravity, Urine: 1.033 — ABNORMAL HIGH
pH: 6

## 2011-07-16 LAB — WOUND CULTURE: Gram Stain: NONE SEEN

## 2011-07-16 LAB — GLUCOSE, CAPILLARY
Glucose-Capillary: 115 — ABNORMAL HIGH
Glucose-Capillary: 117 — ABNORMAL HIGH
Glucose-Capillary: 140 — ABNORMAL HIGH
Glucose-Capillary: 154 — ABNORMAL HIGH
Glucose-Capillary: 161 — ABNORMAL HIGH
Glucose-Capillary: 165 — ABNORMAL HIGH
Glucose-Capillary: 184 — ABNORMAL HIGH

## 2011-07-16 LAB — POCT I-STAT, CHEM 8
BUN: 16
Calcium, Ion: 1.21
Chloride: 103
HCT: 40
Potassium: 3.7
Sodium: 140

## 2011-07-16 LAB — DIFFERENTIAL
Basophils Absolute: 0
Eosinophils Relative: 2
Lymphocytes Relative: 30
Monocytes Absolute: 0.8
Monocytes Relative: 7

## 2011-07-16 LAB — LIPID PANEL
Cholesterol: 184
LDL Cholesterol: 107 — ABNORMAL HIGH

## 2011-11-25 ENCOUNTER — Encounter (HOSPITAL_COMMUNITY): Payer: Self-pay | Admitting: Emergency Medicine

## 2011-11-25 ENCOUNTER — Emergency Department (HOSPITAL_COMMUNITY)
Admission: EM | Admit: 2011-11-25 | Discharge: 2011-11-25 | Disposition: A | Discharge status: Home / Self Care | Payer: PRIVATE HEALTH INSURANCE | Attending: Emergency Medicine | Admitting: Emergency Medicine

## 2011-11-25 DIAGNOSIS — Z794 Long term (current) use of insulin: Secondary | ICD-10-CM | POA: Insufficient documentation

## 2011-11-25 DIAGNOSIS — R739 Hyperglycemia, unspecified: Secondary | ICD-10-CM

## 2011-11-25 DIAGNOSIS — R197 Diarrhea, unspecified: Secondary | ICD-10-CM | POA: Insufficient documentation

## 2011-11-25 DIAGNOSIS — E1169 Type 2 diabetes mellitus with other specified complication: Secondary | ICD-10-CM | POA: Insufficient documentation

## 2011-11-25 DIAGNOSIS — R42 Dizziness and giddiness: Secondary | ICD-10-CM | POA: Insufficient documentation

## 2011-11-25 DIAGNOSIS — R112 Nausea with vomiting, unspecified: Secondary | ICD-10-CM | POA: Insufficient documentation

## 2011-11-25 LAB — URINALYSIS, ROUTINE W REFLEX MICROSCOPIC
Bilirubin Urine: NEGATIVE
Glucose, UA: >1000 mg/dL — AB
Ketones, ur: NEGATIVE mg/dL
Leukocytes, UA: NEGATIVE
Nitrite: NEGATIVE
Specific Gravity, Urine: 1.033 — ABNORMAL HIGH (ref 1.005–1.030)
pH: 6 (ref 5.0–8.0)

## 2011-11-25 LAB — CBC
MCH: 29.7 pg (ref 26.0–34.0)
MCHC: 35.3 g/dL (ref 30.0–36.0)
RDW: 13.8 % (ref 11.5–15.5)

## 2011-11-25 LAB — COMPREHENSIVE METABOLIC PANEL
ALT: 19 U/L (ref 0–53)
Albumin: 3.7 g/dL (ref 3.5–5.2)
Alkaline Phosphatase: 88 U/L (ref 39–117)
Calcium: 10 mg/dL (ref 8.4–10.5)
Potassium: 4.3 mEq/L (ref 3.5–5.1)
Sodium: 138 mEq/L (ref 135–145)
Total Protein: 7.3 g/dL (ref 6.0–8.3)

## 2011-11-25 LAB — URINE MICROSCOPIC-ADD ON: Urine-Other: NONE SEEN

## 2011-11-25 MED ORDER — OXYCODONE-ACETAMINOPHEN 5-325 MG PO TABS
1.0000 | ORAL_TABLET | Freq: Once | ORAL | Status: AC
  Administered 2011-11-25: 1 via ORAL
  Filled 2011-11-25: qty 1

## 2011-11-25 MED ORDER — DIPHENOXYLATE-ATROPINE 2.5-0.025 MG PO TABS: 1.0000 | ORAL_TABLET | Freq: Four times a day (QID) | ORAL | Status: AC | PRN

## 2011-11-25 MED ORDER — SODIUM CHLORIDE 0.9 % IV BOLUS (SEPSIS)
1000.0000 mL | Freq: Once | INTRAVENOUS | Status: AC
  Administered 2011-11-25: 1000 mL via INTRAVENOUS

## 2011-11-25 NOTE — ED Notes (Signed)
Pt states he has had emesis and diarrhea on and off since thurday night.  Last episode of emesis yesterday afternoon.  No nausea currently, was able to breakfast this morning.  Also has some lightheadness.  Has a headache right now.

## 2011-11-25 NOTE — ED Provider Notes (Signed)
History     CSN: [id number]  Arrival date & time 11/25/11  7829   First MD Initiated Contact with Patient 11/25/11 [phone]      Chief Complaint  Patient presents with  . Emesis  . Diarrhea   patient has no history of diabetes, and chronic, nonhealing leg ulcerations. He reports nausea, and diarrhea off and on since Thursday night. He states Friday he actually felt much better. The emesis had resolved however, he states they're watery diarrhea started again yesterday. He is on Bactrim antibiotics chronically. He has had no abdominal pain, no fevers. He states he is feeling somewhat dizzy and his blood sugars have been in the range of 200 at home. She has had no chest or back pain. Denies any recent travel. No sick contacts that he is aware of  (Consider location/radiation/quality/duration/timing/severity/associated sxs/prior treatment) HPI  Past Medical History  Diagnosis Date  . Diabetes mellitus   . 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 Appendectomy     Family History  Problem Relation Age of Onset  . Cancer Mother     History  Substance Use Topics  . Smoking status: Current Everyday Smoker -- 1.0 packs/day for 30 years    Types: Cigarettes  . Smokeless tobacco: Not on file  . Alcohol Use: No      Review of Systems  All other systems reviewed and are negative.    Allergies  Review of patient's allergies indicates no known allergies.  Home Medications   Current Outpatient Rx  Name Route Sig Dispense Refill  . INSULIN GLARGINE 100 UNIT/ML [place] SOLN  25 units daily 10 mL 3  . METFORMIN HCL 500 MG PO TABS Oral Take 1 tablet (500 mg total) by mouth 2 (two) times daily with a meal. 60 tablet 11    BP 162/83  Pulse 89  Temp(Src) 98.3 F (36.8 C) (Oral)  Resp 20  SpO2 96%  Physical Exam  Nursing note and vitals reviewed. Constitutional: He is oriented to person, place, and time. He appears  well-developed and well-nourished.  HENT:  Head: Normocephalic and atraumatic.  Eyes: Conjunctivae and EOM are normal. Pupils are equal, round, and reactive to light.  Neck: Neck supple.  Cardiovascular: Normal rate and regular rhythm.  Exam reveals no gallop and no friction rub.   No murmur heard. Pulmonary/Chest: Breath sounds normal. He has no wheezes. He has no rales. He exhibits no tenderness.  Abdominal: Soft. Bowel sounds are normal. He exhibits no distension. There is no tenderness. There is no rebound and no guarding.  Musculoskeletal: Normal range of motion.  Neurological: He is alert and oriented to person, place, and time. No cranial nerve deficit. Coordination normal.  Skin: Skin is warm and dry. No rash noted.       Chronic venous stasis dermatitis. 2 chronic-appearing ulcerations that are dime-sized 1 appears to be healing, well. 1 still has some yellow exudate. No significant surrounding erythema  Psychiatric: He has a normal mood and affect.    ED Course  Procedures (including critical care time)   Labs Reviewed  CBC  COMPREHENSIVE METABOLIC PANEL  URINALYSIS, ROUTINE W REFLEX MICROSCOPIC  STOOL CULTURE  CLOSTRIDIUM DIFFICILE BY PCR   No results found.   No diagnosis found.    MDM  Patient is seen and examined, initial history and physical is completed. Evaluation initiated  Results for orders placed during the  hospital encounter of 07/01/11  POCT I-STAT, CHEM 8      Component Value Range   Sodium 133 (*) 135 - 145 (mEq/L)   Potassium 3.8  3.5 - 5.1 (mEq/L)   Chloride 99  96 - 112 (mEq/L)   BUN 10  6 - 23 (mg/dL)   Creatinine, Ser 1.61  0.50 - 1.35 (mg/dL)   Glucose, Bld 096 (*) 70 - 99 (mg/dL)   Calcium, Ion 0.45 (*) 1.12 - 1.32 (mmol/L)   TCO2 23  0 - 100 (mmol/L)   Hemoglobin 14.3  13.0 - 17.0 (g/dL)   HCT 40.9  81.1 - 91.4 (%)  DIFFERENTIAL      Component Value Range   Neutrophils Relative 82 (*) 43 - 77 (%)   Neutro Abs 11.1 (*) 1.7 - 7.7  (K/uL)   Lymphocytes Relative 8 (*) 12 - 46 (%)   Lymphs Abs 1.1  0.7 - 4.0 (K/uL)   Monocytes Relative 10  3 - 12 (%)   Monocytes Absolute 1.3 (*) 0.1 - 1.0 (K/uL)   Eosinophils Relative 0  0 - 5 (%)   Eosinophils Absolute 0.0  0.0 - 0.7 (K/uL)   Basophils Relative 0  0 - 1 (%)   Basophils Absolute 0.0  0.0 - 0.1 (K/uL)  CBC      Component Value Range   WBC 13.6 (*) 4.0 - 10.5 (K/uL)   RBC 4.58  4.22 - 5.81 (MIL/uL)   Hemoglobin 13.7  13.0 - 17.0 (g/dL)   HCT 78.2  95.6 - 21.3 (%)   MCV 86.7  78.0 - 100.0 (fL)   MCH 29.9  26.0 - 34.0 (pg)   MCHC 34.5  30.0 - 36.0 (g/dL)   RDW 08.6  57.8 - 46.9 (%)   Platelets 215  150 - 400 (K/uL)   No results found.     12:40 PM  We'll give 1 additional liter of IV fluids. The patient is feeling much better. Blood sugars 352. Electrolytes are normal. No sign of DKA. No ketones in the urine is requesting a work note will be discharged home in stable improved condition with instructions  Chau Savell A. Patrica Duel, MD 11/25/11 1240

## 2011-11-25 NOTE — ED Notes (Signed)
Patient given urinal and made aware of need for urine sample 

## 2012-01-01 ENCOUNTER — Emergency Department (HOSPITAL_COMMUNITY)
Admission: EM | Admit: 2012-01-01 | Discharge: 2012-01-01 | Disposition: A | Discharge status: Home / Self Care | Payer: PRIVATE HEALTH INSURANCE | Attending: Emergency Medicine | Admitting: Emergency Medicine

## 2012-01-01 ENCOUNTER — Encounter (HOSPITAL_COMMUNITY): Payer: Self-pay | Admitting: *Deleted

## 2012-01-01 ENCOUNTER — Emergency Department (HOSPITAL_COMMUNITY): Payer: PRIVATE HEALTH INSURANCE

## 2012-01-01 DIAGNOSIS — E119 Type 2 diabetes mellitus without complications: Secondary | ICD-10-CM | POA: Insufficient documentation

## 2012-01-01 DIAGNOSIS — L97909 Non-pressure chronic ulcer of unspecified part of unspecified lower leg with unspecified severity: Secondary | ICD-10-CM | POA: Insufficient documentation

## 2012-01-01 DIAGNOSIS — M79609 Pain in unspecified limb: Secondary | ICD-10-CM | POA: Insufficient documentation

## 2012-01-01 DIAGNOSIS — M7989 Other specified soft tissue disorders: Secondary | ICD-10-CM | POA: Insufficient documentation

## 2012-01-01 DIAGNOSIS — Z79899 Other long term (current) drug therapy: Secondary | ICD-10-CM | POA: Insufficient documentation

## 2012-01-01 LAB — CBC
HCT: 39 % (ref 39.0–52.0)
Hemoglobin: 13.5 g/dL (ref 13.0–17.0)
RBC: 4.48 MIL/uL (ref 4.22–5.81)
WBC: 10.6 10*3/uL — ABNORMAL HIGH (ref 4.0–10.5)

## 2012-01-01 LAB — BASIC METABOLIC PANEL
BUN: 20 mg/dL (ref 6–23)
CO2: 29 mEq/L (ref 19–32)
Chloride: 101 mEq/L (ref 96–112)
Creatinine, Ser: 0.74 mg/dL (ref 0.50–1.35)
GFR calc Af Amer: >90 mL/min (ref >90)
Potassium: 3.8 mEq/L (ref 3.5–5.1)

## 2012-01-01 LAB — DIFFERENTIAL
Lymphocytes Relative: 35 % (ref 12–46)
Monocytes Absolute: 0.7 10*3/uL (ref 0.1–1.0)
Monocytes Relative: 6 % (ref 3–12)
Neutro Abs: 5.9 10*3/uL (ref 1.7–7.7)
Neutrophils Relative %: 55 % (ref 43–77)

## 2012-01-01 MED ORDER — OXYCODONE-ACETAMINOPHEN 5-325 MG PO TABS
2.0000 | ORAL_TABLET | Freq: Once | ORAL | Status: AC
  Administered 2012-01-01: 2 via ORAL
  Filled 2012-01-01: qty 2

## 2012-01-01 MED ORDER — OXYCODONE-ACETAMINOPHEN 5-325 MG PO TABS: 2.0000 | ORAL_TABLET | ORAL | Status: AC | PRN

## 2012-01-01 NOTE — ED Notes (Signed)
Per ems: pt has 2 ulcers  On his right leg. Pt is also c/o right leg pain. Pt is unable ambulate

## 2012-01-01 NOTE — ED Provider Notes (Signed)
History     CSN: [id number]  Arrival date & time 01/01/12  1238   First MD Initiated Contact with Patient 01/01/12 1552      Chief Complaint  Patient presents with  . Leg Pain    (Consider location/radiation/quality/duration/timing/severity/associated sxs/prior treatment) Patient is a 46 y.o. male presenting with leg pain. The history is provided by the patient and medical records.  Leg Pain    the patient is a 46 year old, obese male, with diabetes.  He also smokes cigarettes.  He presents emergency department complaining of an ulcer in his right leg with pain.  He denies fevers, chills, nausea, vomiting.  He denies trauma to his leg.  He states that he is seen by the outpatient clinic at [facility] and he is already taking Bactrim for the wound on his leg.  He denies any other symptoms  Past Medical History  Diagnosis Date  . Diabetes mellitus   . 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 Appendectomy     Family History  Problem Relation Age of Onset  . Cancer Mother     History  Substance Use Topics  . Smoking status: Current Everyday Smoker -- 1.0 packs/day for 30 years    Types: Cigarettes  . Smokeless tobacco: Not on file  . Alcohol Use: No      Review of Systems  Constitutional: Negative for fever and chills.  HENT: Negative for sore throat.   Respiratory: Negative for cough.   Cardiovascular: Positive for leg swelling.  Gastrointestinal: Negative for nausea and vomiting.  Musculoskeletal:       Right leg pain, and redness  Skin: Positive for color change and wound.       Right lower extremity redness, with ulcer on the medial side  Neurological: Negative for headaches.  Psychiatric/Behavioral: Negative for confusion.  All other systems reviewed and are negative.    Allergies  Review of patient's allergies indicates no known allergies.  Home Medications   Current Outpatient Rx  Name  Route Sig Dispense Refill  . METFORMIN HCL 500 MG PO TABS Oral Take 1 tablet (500 mg total) by mouth 2 (two) times daily with a meal. 60 tablet 11  . SULFAMETHOXAZOLE-TMP DS 800-160 MG PO TABS Oral Take 1 tablet by mouth 2 (two) times daily. Patient is on this medication continuously      BP 124/70  Pulse 73  Temp 98.8 F (37.1 C)  Resp 18  SpO2 100%  Physical Exam  Vitals reviewed. Constitutional: He is oriented to person, place, and time. No distress.       Morbidly obese  HENT:  Head: Normocephalic and atraumatic.  Eyes: Conjunctivae and EOM are normal.  Neck: Normal range of motion.  Cardiovascular: Normal rate.   No murmur heard. Pulmonary/Chest: Effort normal. No respiratory distress.  Musculoskeletal:       Right lower extremity has venous stasis changes circumferentially with an approximately 2 cm, circular ulceration that is approximately 1 cm deep.  There is surrounding tenderness.  There is no evidence of erythematous streaking going up his thigh venous stasis changes going from the foot to approximately 2/3 the way up.  His calf and then stop  Neurological: He is alert and oriented to person, place, and time.  Skin: Skin is warm and dry. There is erythema.  Psychiatric: He has a normal mood and affect. Thought content normal.  ED Course  Procedures (including critical care time) 46 year old, male, with diabetes, and who is a smoker, presents with an ulceration in his right medial thigh with surrounding tenderness.  He has no fevers, chills, or other signs of systemic illness.  He is presently taking Bactrim for this infection.  We will perform blood tests, and an x-ray, to look for osteomyelitis or signs of systemic illness.  I will give him Percocet for his pain.   Labs Reviewed  CBC  DIFFERENTIAL  BASIC METABOLIC PANEL   No results found.   No diagnosis found.  6:15 PM sxs improved  MDM  Right leg venous stasis ulcer        Cheri Guppy,  MD 01/01/12 1815

## 2012-01-01 NOTE — Progress Notes (Signed)
Pt confirms he is being seen by [facility] urgent care providers He inquired about how to apply for disability CM referred him to DSS and his pcp to get forms completed and sent in to see if he is a candidate Pt voiced understanding and appreciation of information and resources offered. Provided with written information about self pay providers, DSS information along with other [place] resources

## 2012-01-01 NOTE — Discharge Instructions (Signed)
Your x-ray, and blood tests do not show any significant illness.  Use Percocet for pain.  Followup with your Dr. for referral to the wound center.

## 2012-01-06 ENCOUNTER — Encounter (HOSPITAL_COMMUNITY): Payer: Self-pay | Admitting: Emergency Medicine

## 2012-01-06 ENCOUNTER — Emergency Department (HOSPITAL_COMMUNITY)
Admission: EM | Admit: 2012-01-06 | Discharge: 2012-01-06 | Disposition: A | Discharge status: Home / Self Care | Payer: PRIVATE HEALTH INSURANCE | Attending: Emergency Medicine | Admitting: Emergency Medicine

## 2012-01-06 DIAGNOSIS — I8310 Varicose veins of unspecified lower extremity with inflammation: Secondary | ICD-10-CM | POA: Insufficient documentation

## 2012-01-06 DIAGNOSIS — L02419 Cutaneous abscess of limb, unspecified: Secondary | ICD-10-CM | POA: Insufficient documentation

## 2012-01-06 DIAGNOSIS — F17200 Nicotine dependence, unspecified, uncomplicated: Secondary | ICD-10-CM | POA: Insufficient documentation

## 2012-01-06 DIAGNOSIS — E119 Type 2 diabetes mellitus without complications: Secondary | ICD-10-CM | POA: Insufficient documentation

## 2012-01-06 DIAGNOSIS — Z09 Encounter for follow-up examination after completed treatment for conditions other than malignant neoplasm: Secondary | ICD-10-CM | POA: Insufficient documentation

## 2012-01-06 NOTE — Discharge Instructions (Signed)
FOLLOW UP WITH YOUR DOCTOR AS NEEDED. CONTINUE TO CLEAN DRAINING AREAS 2-3 TIMES DAILY AND KEEP COVERED.

## 2012-01-06 NOTE — ED Notes (Signed)
Pt stated he needs a wound recheck and dr's note so he can go back to work without restrictions.

## 2012-01-06 NOTE — ED Provider Notes (Signed)
Medical screening examination/treatment/procedure(s) were performed by non-physician practitioner and as supervising physician I was immediately available for consultation/collaboration.  Brookie Wayment, MD 01/06/12 1529 

## 2012-01-06 NOTE — ED Provider Notes (Signed)
History     CSN: [id number]  Arrival date & time 01/06/12  1104   First MD Initiated Contact with Patient 01/06/12 1251      Chief Complaint  Patient presents with  . Wound Check    (Consider location/radiation/quality/duration/timing/severity/associated sxs/prior treatment) Patient is a 46 y.o. male presenting with wound check. The history is provided by the patient.  Wound Check  He was treated in the ED 2 to 3 days ago. Previous treatment in the ED includes wound cleansing or irrigation and oral antibiotics. Treatments since wound repair include oral antibiotics. 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 He was put on narcotic pain relievers 4 days ago and now needs clearance to return to work. ) discharge from the wound.    Past Medical History  Diagnosis Date  . Diabetes mellitus   . 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 Appendectomy     Family History  Problem Relation Age of Onset  . Cancer Mother     History  Substance Use Topics  . Smoking status: Current Everyday Smoker -- 1.0 packs/day for 30 years    Types: Cigarettes  . Smokeless tobacco: Not on file  . Alcohol Use: No      Review of Systems  Constitutional: Negative for fever and chills.  HENT: Negative.   Respiratory: Negative.   Cardiovascular: Negative.   Gastrointestinal: Negative.   Musculoskeletal: Negative.   Skin:       See HPI.  Neurological: Negative.     Allergies  Review of patient's allergies indicates no known allergies.  Home Medications   Current Outpatient Rx  Name Route Sig Dispense Refill  . METFORMIN HCL 500 MG PO TABS Oral Take 1 tablet (500 mg total) by mouth 2 (two) times daily with a meal. 60 tablet 11  . OXYCODONE-ACETAMINOPHEN 5-325 MG PO TABS Oral Take 2 tablets by mouth every 4 (four) hours as needed for pain. 15 tablet 0  .  SULFAMETHOXAZOLE-TMP DS 800-160 MG PO TABS Oral Take 1 tablet by mouth 2 (two) times daily. Patient is on this medication continuously      BP 146/86  Pulse 74  Temp(Src) 98.5 F (36.9 C) (Oral)  Resp 20  SpO2 97%  Physical Exam  Constitutional: He is oriented to person, place, and time. He appears well-developed and well-nourished.  Neck: Normal range of motion.  Pulmonary/Chest: Effort normal.  Musculoskeletal: Normal range of motion.  Neurological: He is alert and oriented to person, place, and time.  Skin: Skin is warm and dry.       Two 1 cm opening with serosanguinous fluid drainage on lower right leg. Minimal tenderness, subjectively improved. Skin changes of chronic venous stasis.  Psychiatric: He has a normal mood and affect.    ED Course  Procedures (including critical care time)  Labs Reviewed - No data to display No results found.   No diagnosis found.  1. Wound recheck 2. Healing abscesses 3. Venous stasis  MDM          Rodena Medin, PA-C 01/06/12 1316

## 2012-02-06 IMAGING — CR DG TIBIA/FIBULA 2V*R*
4 series · 4 of 4 positions shown · non-contrast
Comparison: 08/24/2008

CLINICAL DATA: Right lower leg pain.  Draining wound.

RIGHT TIBIA AND FIBULA - 2 VIEW

[t tib/fib ap right (1 of 2)]
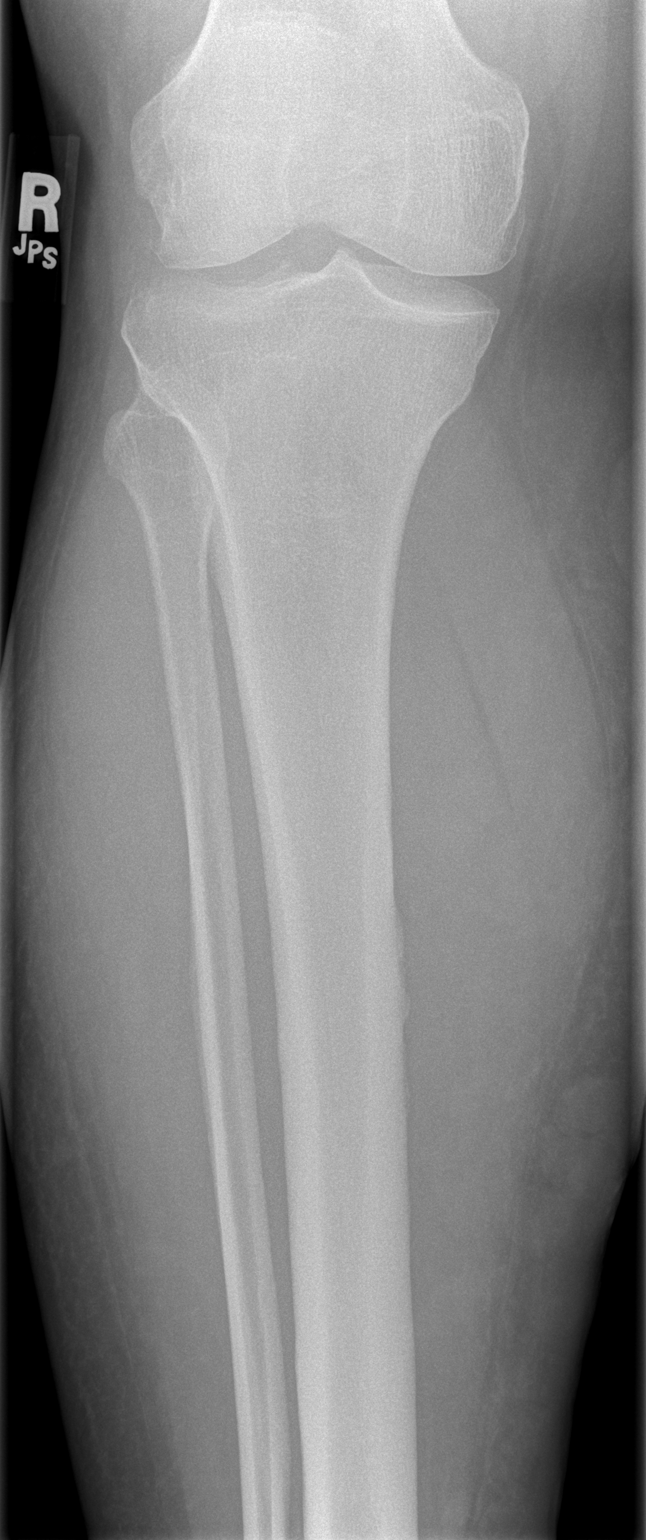

[t tib/fib ap right (2 of 2)]
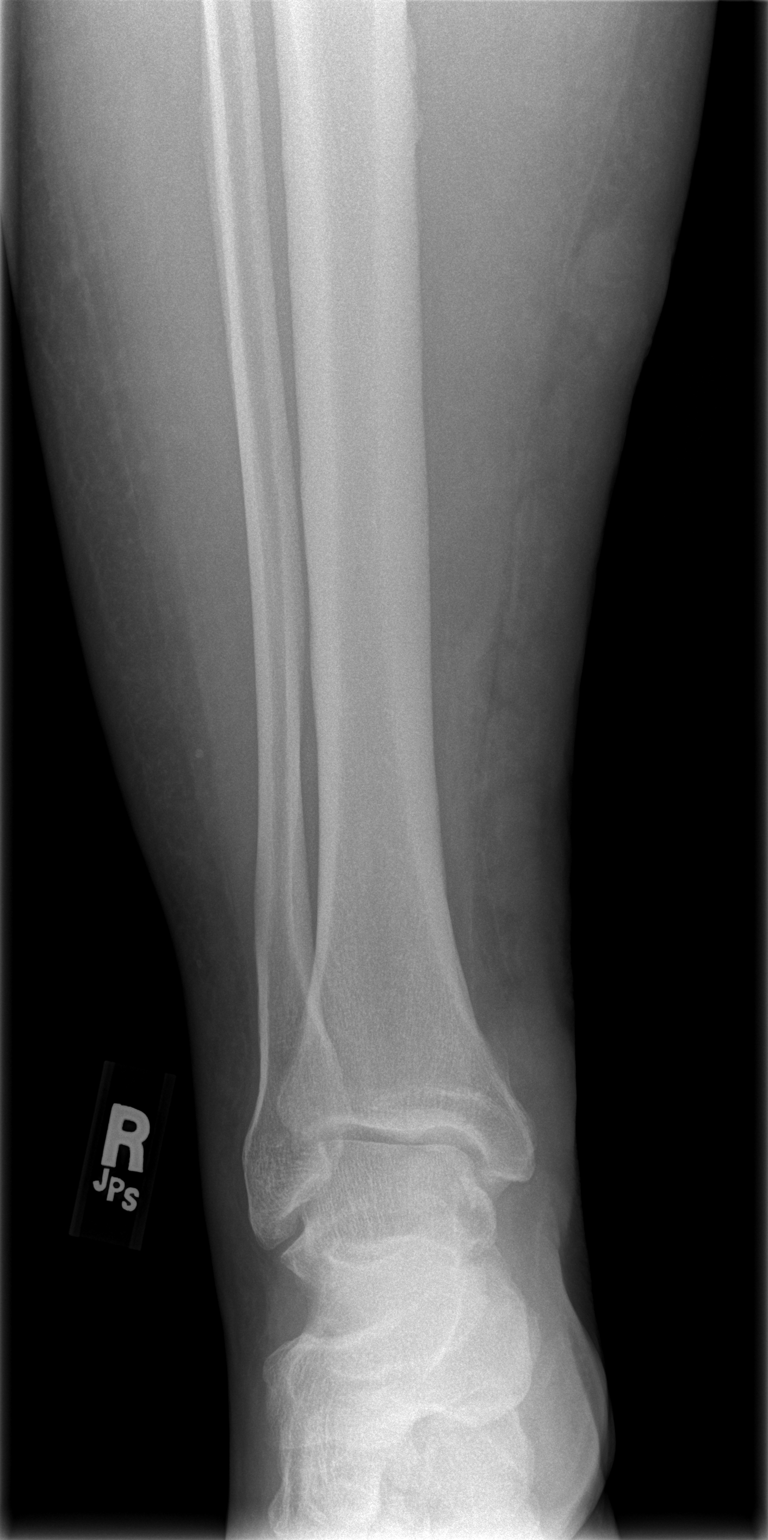

[t tib/fib lat right (1 of 2)]
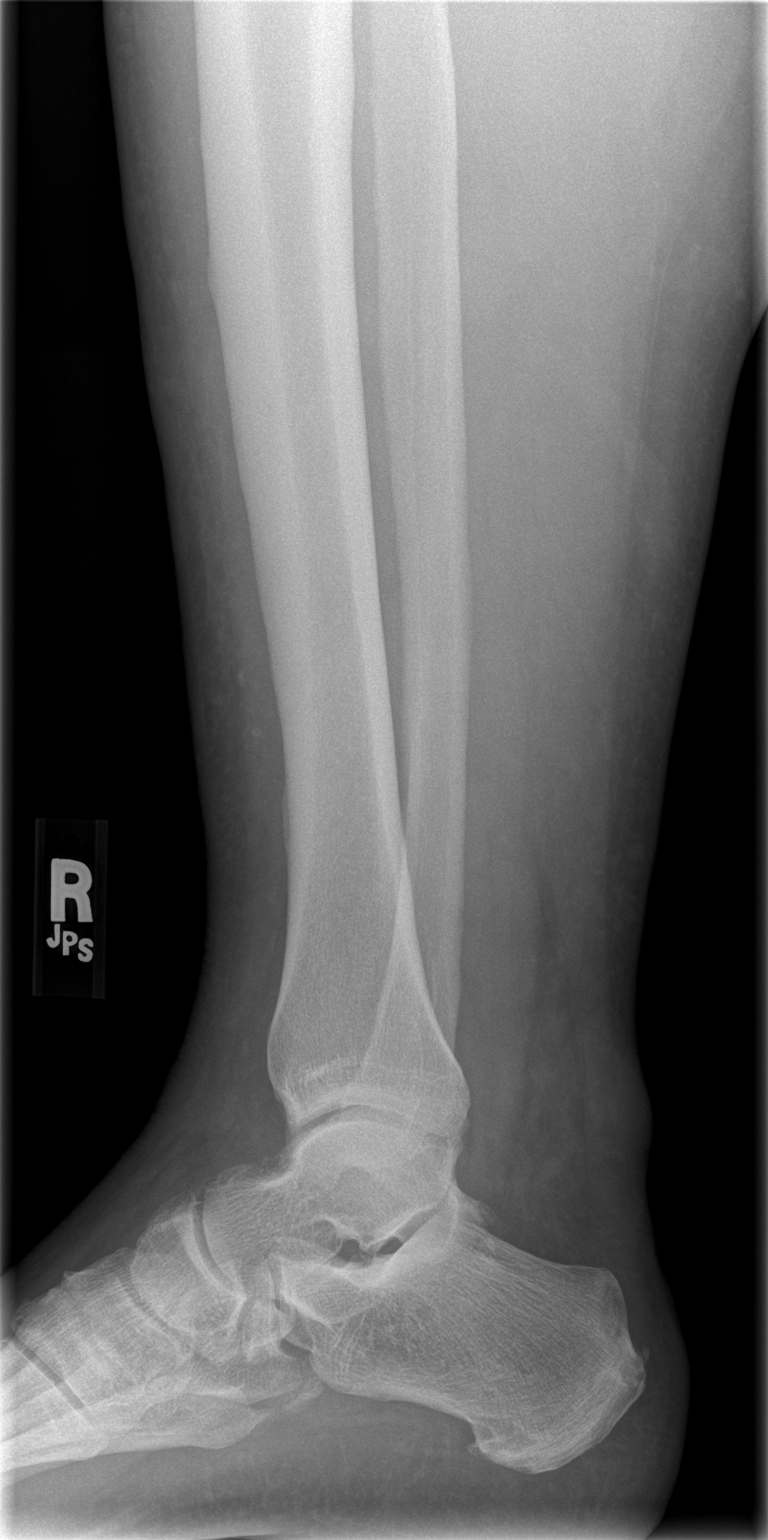

[t tib/fib lat right (2 of 2)]
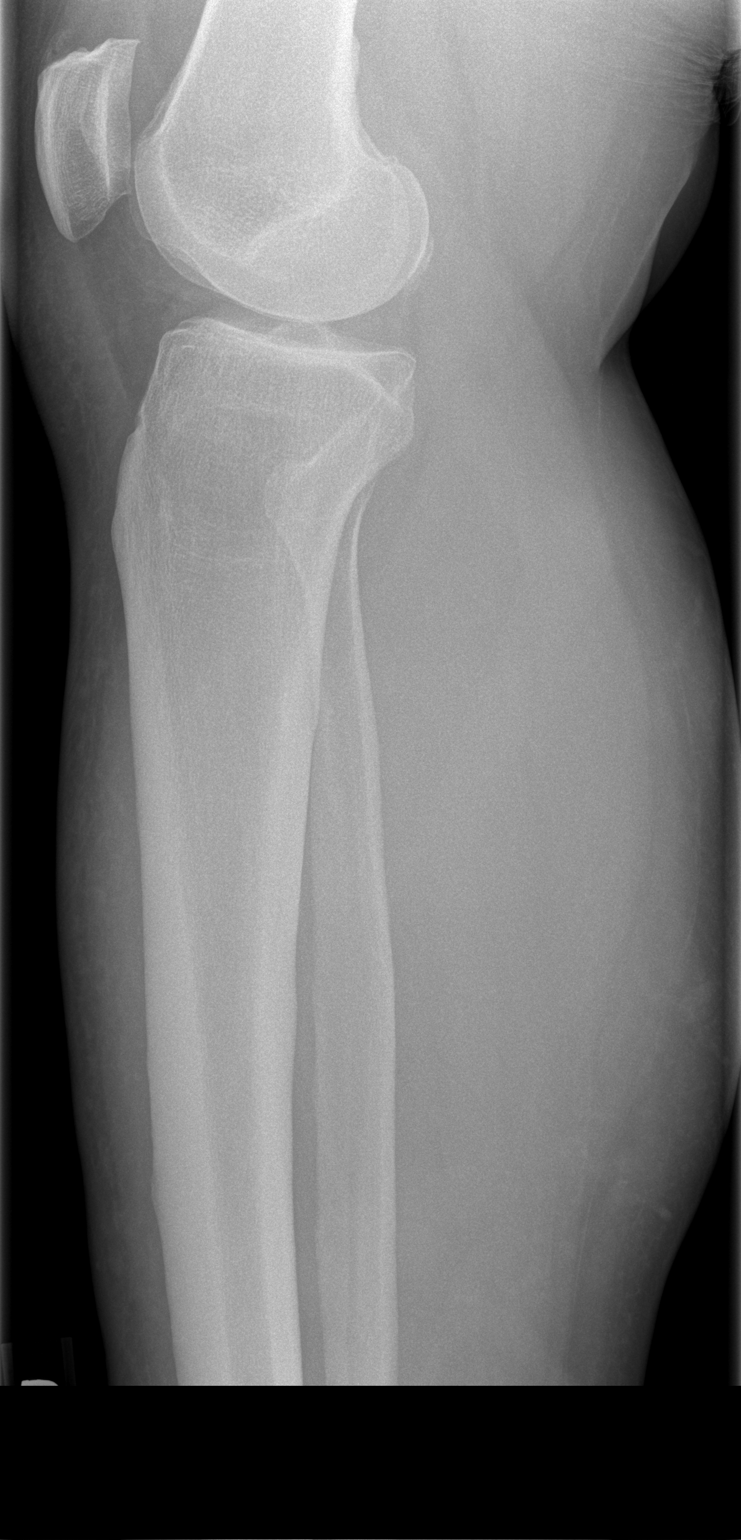

[4 of 4 positions shown; findings below may reference images not displayed]

FINDINGS: Mild lower leg soft tissue swelling is noted.
There is no evidence of fracture, subluxation or dislocation.
There is no evidence of osteomyelitis.
No radiopaque foreign bodies are present.
IMPRESSION: Mild soft tissue swelling without acute bony abnormality.

## 2013-04-22 ENCOUNTER — Other Ambulatory Visit: Payer: Self-pay

## 2013-11-18 ENCOUNTER — Encounter (HOSPITAL_COMMUNITY): Payer: Self-pay | Admitting: Emergency Medicine

## 2013-11-18 ENCOUNTER — Emergency Department (HOSPITAL_COMMUNITY)
Admission: EM | Admit: 2013-11-18 | Discharge: 2013-11-18 | Disposition: A | Discharge status: Home / Self Care | Payer: Self-pay | Attending: Emergency Medicine | Admitting: Emergency Medicine

## 2013-11-18 ENCOUNTER — Emergency Department (HOSPITAL_COMMUNITY): Payer: Self-pay

## 2013-11-18 DIAGNOSIS — E119 Type 2 diabetes mellitus without complications: Secondary | ICD-10-CM | POA: Insufficient documentation

## 2013-11-18 DIAGNOSIS — F17200 Nicotine dependence, unspecified, uncomplicated: Secondary | ICD-10-CM | POA: Insufficient documentation

## 2013-11-18 DIAGNOSIS — L97929 Non-pressure chronic ulcer of unspecified part of left lower leg with unspecified severity: Secondary | ICD-10-CM

## 2013-11-18 DIAGNOSIS — M25569 Pain in unspecified knee: Secondary | ICD-10-CM | POA: Insufficient documentation

## 2013-11-18 DIAGNOSIS — I8390 Asymptomatic varicose veins of unspecified lower extremity: Secondary | ICD-10-CM

## 2013-11-18 DIAGNOSIS — L97919 Non-pressure chronic ulcer of unspecified part of right lower leg with unspecified severity: Secondary | ICD-10-CM | POA: Insufficient documentation

## 2013-11-18 DIAGNOSIS — I83219 Varicose veins of right lower extremity with both ulcer of unspecified site and inflammation: Secondary | ICD-10-CM | POA: Insufficient documentation

## 2013-11-18 DIAGNOSIS — I83229 Varicose veins of left lower extremity with both ulcer of unspecified site and inflammation: Secondary | ICD-10-CM

## 2013-11-18 DIAGNOSIS — M79609 Pain in unspecified limb: Secondary | ICD-10-CM

## 2013-11-18 LAB — GLUCOSE, CAPILLARY: Glucose-Capillary: 123 mg/dL — ABNORMAL HIGH (ref 70–99)

## 2013-11-18 MED ORDER — OXYCODONE-ACETAMINOPHEN 5-325 MG PO TABS: 1.0000 | ORAL_TABLET | Freq: Four times a day (QID) | ORAL | Status: DC | PRN

## 2013-11-18 MED ORDER — HYDROMORPHONE HCL PF 2 MG/ML IJ SOLN
2.0000 mg | Freq: Once | INTRAMUSCULAR | Status: AC
  Administered 2013-11-18: 2 mg via INTRAMUSCULAR
  Filled 2013-11-18: qty 1

## 2013-11-18 NOTE — ED Notes (Signed)
US at bedside

## 2013-11-18 NOTE — Progress Notes (Signed)
VASCULAR LAB PRELIMINARY  PRELIMINARY  PRELIMINARY  PRELIMINARY  Right lower extremity venous duplex completed.    Preliminary report:  No evidence of right lower extremity DVT. There is no evidence of thrombus of the native greater saphenous vein. Multiple varicosites are noted throughout the leg with thrombosed varicosities in the mid calf. Also noted is mild enlargement of the inguinal lymph nodes.  Keeven Matty, [place], RVS 11/18/2013, 3:38 PM

## 2013-11-18 NOTE — Discharge Instructions (Signed)
Compression Stockings Compression stockings are elastic stockings that "compress" your legs. This helps to increase blood flow, decrease swelling, and reduces the chance of getting blood clots in your lower legs. Compression stockings are used:  After surgery.  If you have a history of poor circulation.  If you are prone to blood clots.  If you have varicose veins.  If you sit or are bedridden for long periods of time. WEARING COMPRESSION STOCKINGS  Your compression stockings should be worn as instructed by your caregiver.  Wearing the correct stocking size is important. Your caregiver can help measure and fit you to the correct size.  When wearing your stockings, do not allow the stockings to bunch up. This is especially important around your toes or behind your knees. Keep the stockings as smooth as possible.  Do not roll the stockings downward and leave them rolled down. This can form a restrictive band around your legs and can decrease blood flow.  The stockings should be removed once a day for 1 hour or as instructed by your caregiver. When the stockings are taken off, inspect your legs and feet. Look for:  Open sores.  Red spots.  Puffy areas (swelling).  Anything that does not seem normal. IMPORTANT INFORMATION ABOUT COMPRESSION STOCKINGS  The compression stockings should be clean, dry, and in good condition before you put them on.  Do not put lotion on your legs or feet. This makes it harder to put the stockings on.  Change your stockings immediately if they become wet or soiled.  Do not wear stockings that are ripped or torn.  You may hand-wash or put your stockings in the washing machine. Use cold or warm water with mild detergent. Do not bleach your stockings. They may be air-dried or dried in the dryer on low heat.  If you have pain or have a feeling of "pins and needles" in your feet or legs, you may be wearing stockings that are too tight. Call your caregiver  right away. SEEK IMMEDIATE MEDICAL CARE IF:   You have numbness or tingling in your lower legs that does not get better quickly after the stockings are removed.  Your toes or feet become cold and blue.  You develop open sores or have red spots on your legs that do not go away. MAKE SURE YOU:   Understand these instructions.  Will watch your condition.  Will get help right away if you are not doing well or get worse. Document Released: 07/28/2009 Document Revised: 12/23/2011 Document Reviewed: 07/28/2009 [employer] Patient Information 2014 [employer].  

## 2013-11-18 NOTE — ED Provider Notes (Addendum)
CSN: [id number]     Arrival date & time 11/18/13  1256 History   First MD Initiated Contact with Patient 11/18/13 1351     Chief Complaint  Patient presents with  . Leg Pain   (Consider location/radiation/quality/duration/timing/severity/associated sxs/prior Treatment) Patient is a 48 y.o. male presenting with leg pain. The history is provided by the patient.  Leg Pain Location:  Knee and leg Time since incident: has been going on for 1-2 months but worse in the last 1 week. Injury: no   Leg location:  R upper leg and R lower leg Knee location:  R knee Pain details:    Quality:  Sharp, shooting and throbbing   Radiates to:  Does not radiate   Severity:  Severe   Onset quality:  Gradual   Duration:  1 week   Timing:  Constant   Progression:  Worsening Chronicity:  Recurrent Prior injury to area:  No Relieved by:  Rest and elevation Worsened by:  Activity and bearing weight Ineffective treatments:  NSAIDs Associated symptoms: decreased ROM and swelling   Associated symptoms: no back pain, no fever and no muscle weakness     Past Medical History  Diagnosis Date  . Diabetes mellitus   . 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 Appendectomy     Family History  Problem Relation Age of Onset  . Cancer Mother    History  Substance Use Topics  . Smoking status: Current Every Day Smoker -- 1.00 packs/day for 30 years    Types: Cigarettes  . Smokeless tobacco: Not on file  . Alcohol Use: No    Review of Systems  Constitutional: Negative for fever.  Musculoskeletal: Negative for back pain.  All other systems reviewed and are negative.    Allergies  Review of patient's allergies indicates no known allergies.  Home Medications   Current Outpatient Rx  Name  Route  Sig  Dispense  Refill  . ibuprofen (ADVIL,MOTRIN) 200 MG tablet   Oral   Take 400-600 mg by mouth every 4 (four) hours as needed  for moderate pain.          BP 138/85  Pulse 82  Temp(Src) 98.3 F (36.8 C)  Resp 20  SpO2 99% Physical Exam  Nursing note and vitals reviewed. Constitutional: He is oriented to person, place, and time. He appears well-developed and well-nourished. No distress.  HENT:  Head: Normocephalic and atraumatic.  Mouth/Throat: Oropharynx is clear and moist.  Eyes: Conjunctivae and EOM are normal. Pupils are equal, round, and reactive to light.  Neck: Normal range of motion. Neck supple.  Cardiovascular: Normal rate, regular rhythm and intact distal pulses.   No murmur heard. Pulmonary/Chest: Effort normal and breath sounds normal. No respiratory distress. He has no wheezes. He has no rales.  Abdominal: Soft. He exhibits no distension. There is no tenderness. There is no rebound and no guarding.  Musculoskeletal: He exhibits edema and tenderness.       Right knee: He exhibits decreased range of motion and swelling. Tenderness found.  Non-pitting edema of the right lower ext.  Palpable tenderness along the right groin.  Visible large varicose veins throughout the lower leg.  Knee pain and swelling over the last month.  Gradually worsening.  Neurological: He is alert and oriented to person, place, and time.  Skin: Skin is warm and dry. No rash noted. No erythema.  Psychiatric:  He has a normal mood and affect. His behavior is normal.    ED Course  Procedures (including critical care time) Labs Review Labs Reviewed - No data to display Imaging Review Dg Knee Complete 4 Views Right  11/18/2013   CLINICAL DATA:  Right knee pain.  EXAM: RIGHT KNEE - COMPLETE 4+ VIEW  COMPARISON:  No priors.  FINDINGS: Four views of the right knee demonstrate no acute displaced fracture, subluxation, dislocation, joint or soft tissue abnormality. Mild degenerative changes of osteoarthritis are noted in a tricompartmental distribution.  IMPRESSION: 1. No acute radiographic abnormality of the right knee. 2. Mild  tricompartmental osteoarthritis.   Electronically Signed   By: Vinnie Langton M.D.   On: 11/18/2013 15:59    EKG Interpretation   None       MDM   1. Vein, varicose   2. Knee pain   3. Varicose veins     Patient presenting with worsening right lower extremity pain and swelling. Significant swelling on the right lower extremity compared to the left. Palpable varicose veins in the right lower extremity that are mildly tender but not warm or erythematous and low concern for superficial thrombophlebitis.  Given length of time of knee pain and swelling low concern for septic joint.  Patient also has pain and swelling around his knee have been going on for months. Concern for DVT and Doppler study ordered. Also plain film of the knee ordered and patient given pain control. He denies any chest pain or shortness of breath. No tachycardia here.  3:53 PM Doppler neg for DVT.  Superficial thrombus of the varicose veins in the right lower leg present.  Plain film show arthritis but no other acute findings.  Will wrap the leg and give pain control.  Will give f/u with ortho.  Blanchie Dessert, MD 11/18/13 1605  Blanchie Dessert, MD 11/18/13 1611  Blanchie Dessert, MD 11/18/13 1616

## 2013-11-18 NOTE — ED Notes (Signed)
Per EMS-right leg pain on and off for a month-history of varicose veins-increased pain from groin all the way down leg

## 2013-11-18 NOTE — Progress Notes (Signed)
[facility] provided pt with a list of primary care resources and ACA information. Patient stated that he was pending Medicaid.

## 2013-11-18 NOTE — ED Notes (Signed)
Bed: ML54 Expected date:  Expected time:  Means of arrival:  Comments: EMS- leg pain, varicose veins

## 2014-05-30 ENCOUNTER — Encounter (HOSPITAL_COMMUNITY): Payer: Self-pay | Admitting: Emergency Medicine

## 2014-05-30 ENCOUNTER — Emergency Department (HOSPITAL_COMMUNITY): Payer: PRIVATE HEALTH INSURANCE

## 2014-05-30 ENCOUNTER — Inpatient Hospital Stay (HOSPITAL_COMMUNITY)
Admission: EM | Admit: 2014-05-30 | Discharge: 2014-06-01 | DRG: 639 | Disposition: A | Discharge status: Home / Self Care | Payer: PRIVATE HEALTH INSURANCE | Attending: Internal Medicine | Admitting: Internal Medicine

## 2014-05-30 DIAGNOSIS — IMO0002 Reserved for concepts with insufficient information to code with codable children: Secondary | ICD-10-CM | POA: Diagnosis present

## 2014-05-30 DIAGNOSIS — R55 Syncope and collapse: Secondary | ICD-10-CM | POA: Diagnosis present

## 2014-05-30 DIAGNOSIS — Z9119 Patient's noncompliance with other medical treatment and regimen: Secondary | ICD-10-CM

## 2014-05-30 DIAGNOSIS — E1165 Type 2 diabetes mellitus with hyperglycemia: Secondary | ICD-10-CM

## 2014-05-30 DIAGNOSIS — IMO0001 Reserved for inherently not codable concepts without codable children: Principal | ICD-10-CM | POA: Diagnosis present

## 2014-05-30 DIAGNOSIS — R739 Hyperglycemia, unspecified: Secondary | ICD-10-CM

## 2014-05-30 DIAGNOSIS — Z91199 Patient's noncompliance with other medical treatment and regimen due to unspecified reason: Secondary | ICD-10-CM

## 2014-05-30 DIAGNOSIS — I82890 Acute embolism and thrombosis of other specified veins: Secondary | ICD-10-CM | POA: Diagnosis present

## 2014-05-30 DIAGNOSIS — E785 Hyperlipidemia, unspecified: Secondary | ICD-10-CM | POA: Diagnosis present

## 2014-05-30 DIAGNOSIS — E669 Obesity, unspecified: Secondary | ICD-10-CM | POA: Diagnosis present

## 2014-05-30 DIAGNOSIS — M79604 Pain in right leg: Secondary | ICD-10-CM | POA: Diagnosis present

## 2014-05-30 DIAGNOSIS — R1011 Right upper quadrant pain: Secondary | ICD-10-CM

## 2014-05-30 DIAGNOSIS — I10 Essential (primary) hypertension: Secondary | ICD-10-CM | POA: Diagnosis present

## 2014-05-30 DIAGNOSIS — Z6838 Body mass index (BMI) 38.0-38.9, adult: Secondary | ICD-10-CM

## 2014-05-30 DIAGNOSIS — I8390 Asymptomatic varicose veins of unspecified lower extremity: Secondary | ICD-10-CM | POA: Diagnosis present

## 2014-05-30 DIAGNOSIS — F17200 Nicotine dependence, unspecified, uncomplicated: Secondary | ICD-10-CM | POA: Diagnosis present

## 2014-05-30 HISTORY — DX: Syncope and collapse: R55

## 2014-05-30 LAB — COMPREHENSIVE METABOLIC PANEL
ALT: 22 U/L (ref 0–53)
ANION GAP: 16 — AB (ref 5–15)
AST: 18 U/L (ref 0–37)
Albumin: 4.3 g/dL (ref 3.5–5.2)
Alkaline Phosphatase: 101 U/L (ref 39–117)
BUN: 26 mg/dL — AB (ref 6–23)
CALCIUM: 9.8 mg/dL (ref 8.4–10.5)
CO2: 21 mEq/L (ref 19–32)
Chloride: 99 mEq/L (ref 96–112)
Creatinine, Ser: 0.92 mg/dL (ref 0.50–1.35)
GFR calc non Af Amer: >90 mL/min (ref >90)
GLUCOSE: 303 mg/dL — AB (ref 70–99)
Potassium: 5.1 mEq/L (ref 3.7–5.3)
SODIUM: 136 meq/L — AB (ref 137–147)
TOTAL PROTEIN: 7.5 g/dL (ref 6.0–8.3)
Total Bilirubin: <0.2 mg/dL — ABNORMAL LOW (ref 0.3–1.2)

## 2014-05-30 LAB — CBC
HCT: 46.4 % (ref 39.0–52.0)
HEMOGLOBIN: 16.3 g/dL (ref 13.0–17.0)
MCH: 30.6 pg (ref 26.0–34.0)
MCHC: 35.1 g/dL (ref 30.0–36.0)
MCV: 87.2 fL (ref 78.0–100.0)
Platelets: 259 10*3/uL (ref 150–400)
RBC: 5.32 MIL/uL (ref 4.22–5.81)
RDW: 13.3 % (ref 11.5–15.5)
WBC: 11.3 10*3/uL — ABNORMAL HIGH (ref 4.0–10.5)

## 2014-05-30 LAB — PRO B NATRIURETIC PEPTIDE: Pro B Natriuretic peptide (BNP): 20.1 pg/mL (ref 0–125)

## 2014-05-30 LAB — I-STAT TROPONIN, ED: Troponin i, poc: 0 ng/mL (ref 0.00–0.08)

## 2014-05-30 LAB — URINALYSIS, ROUTINE W REFLEX MICROSCOPIC
Bilirubin Urine: NEGATIVE
Glucose, UA: 500 mg/dL — AB
Hgb urine dipstick: NEGATIVE
Ketones, ur: NEGATIVE mg/dL
LEUKOCYTES UA: NEGATIVE
NITRITE: NEGATIVE
Protein, ur: 30 mg/dL — AB
SPECIFIC GRAVITY, URINE: 1.027 (ref 1.005–1.030)
UROBILINOGEN UA: 0.2 mg/dL (ref 0.0–1.0)
pH: 5.5 (ref 5.0–8.0)

## 2014-05-30 LAB — URINE MICROSCOPIC-ADD ON

## 2014-05-30 LAB — LIPASE, BLOOD: Lipase: 32 U/L (ref 11–59)

## 2014-05-30 LAB — CBG MONITORING, ED: Glucose-Capillary: 301 mg/dL — ABNORMAL HIGH (ref 70–99)

## 2014-05-30 LAB — GLUCOSE, CAPILLARY: Glucose-Capillary: 230 mg/dL — ABNORMAL HIGH (ref 70–99)

## 2014-05-30 MED ORDER — SODIUM CHLORIDE 0.9 % IJ SOLN
3.0000 mL | Freq: Two times a day (BID) | INTRAMUSCULAR | Status: DC
  Administered 2014-05-31 – 2014-06-01 (×2): 3 mL via INTRAVENOUS

## 2014-05-30 MED ORDER — MORPHINE SULFATE 4 MG/ML IJ SOLN
4.0000 mg | Freq: Once | INTRAMUSCULAR | Status: AC
  Administered 2014-05-30: 4 mg via INTRAVENOUS
  Filled 2014-05-30: qty 1

## 2014-05-30 MED ORDER — INSULIN ASPART 100 UNIT/ML ~~LOC~~ SOLN
0.0000 [IU] | Freq: Three times a day (TID) | SUBCUTANEOUS | Status: DC
  Administered 2014-05-31 (×2): 5 [IU] via SUBCUTANEOUS
  Administered 2014-05-31: 8 [IU] via SUBCUTANEOUS
  Administered 2014-06-01 (×2): 3 [IU] via SUBCUTANEOUS

## 2014-05-30 MED ORDER — SODIUM CHLORIDE 0.9 % IV SOLN
INTRAVENOUS | Status: AC
  Administered 2014-05-31: via INTRAVENOUS

## 2014-05-30 MED ORDER — ENOXAPARIN SODIUM 80 MG/0.8ML ~~LOC~~ SOLN
70.0000 mg | Freq: Every day | SUBCUTANEOUS | Status: DC
  Administered 2014-05-31 (×2): 70 mg via SUBCUTANEOUS
  Filled 2014-05-30 (×3): qty 0.8

## 2014-05-30 MED ORDER — INSULIN ASPART 100 UNIT/ML ~~LOC~~ SOLN
3.0000 [IU] | Freq: Three times a day (TID) | SUBCUTANEOUS | Status: DC
  Administered 2014-05-31 – 2014-06-01 (×5): 3 [IU] via SUBCUTANEOUS

## 2014-05-30 MED ORDER — SODIUM CHLORIDE 0.9 % IV BOLUS (SEPSIS)
1000.0000 mL | Freq: Once | INTRAVENOUS | Status: AC
  Administered 2014-05-30: 1000 mL via INTRAVENOUS

## 2014-05-30 MED ORDER — ONDANSETRON HCL 4 MG PO TABS: 4.0000 mg | ORAL_TABLET | Freq: Four times a day (QID) | ORAL | Status: DC | PRN

## 2014-05-30 MED ORDER — INSULIN ASPART 100 UNIT/ML ~~LOC~~ SOLN
0.0000 [IU] | Freq: Every day | SUBCUTANEOUS | Status: DC
  Administered 2014-05-31: 2 [IU] via SUBCUTANEOUS

## 2014-05-30 MED ORDER — ONDANSETRON HCL 4 MG/2ML IJ SOLN: 4.0000 mg | Freq: Four times a day (QID) | INTRAMUSCULAR | Status: DC | PRN

## 2014-05-30 NOTE — ED Provider Notes (Signed)
CSN: [id number]     Arrival date & time 05/30/14  1825 History   First MD Initiated Contact with Patient 05/30/14 1932     Chief Complaint  Patient presents with  . Dizziness  . Headache  . Nausea  . Leg Pain  . Loss of Consciousness     Patient is a 48 y.o. male presenting with syncope. The history is provided by the patient.  Loss of Consciousness Episode history:  Single Most recent episode:  Today Timing:  Intermittent Progression:  Improving Chronicity:  New Relieved by:  None tried Worsened by:  Nothing tried Associated symptoms: dizziness, headaches and shortness of breath   Associated symptoms: no fever and no vomiting   Pt presents for multiple complaints: He reports feeling dizzy for past several days - he reports "world spinning" and feeling he might "pass out" He also reports associated HA He also reports associated lower chest pain/SOB.  He also reports associated RUQ pain as well No fever/vomiting No focal weakness is reported  He was at work today and felt dizzy and it is reported he had syncopal episode No seizure reported No injury reported from episode   Past Medical History  Diagnosis Date  . Diabetes mellitus   . 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 Appendectomy     Family History  Problem Relation Age of Onset  . Cancer Mother    History  Substance Use Topics  . Smoking status: Current Every Day Smoker -- 1.00 packs/day for 30 years    Types: Cigarettes  . Smokeless tobacco: Not on file  . Alcohol Use: No     Comment: occassionally    Review of Systems  Constitutional: Negative for fever.  Respiratory: Positive for shortness of breath.   Cardiovascular: Positive for syncope.  Gastrointestinal: Negative for vomiting.  Neurological: Positive for dizziness, syncope and headaches.  All other systems reviewed and are negative.     Allergies  Review of  patient's allergies indicates no known allergies.  Home Medications   Prior to Admission medications   Not on File   BP 131/80  Pulse 81  Temp(Src) 98.8 F (37.1 C) (Oral)  Resp 22  Ht 6\' 4"  (1.93 m)  Wt 290 lb (131.543 kg)  BMI 35.31 kg/m2  SpO2 96% Physical Exam CONSTITUTIONAL: Well developed/well nourished HEAD: Normocephalic/atraumatic EYES: EOMI/PERRL, no icterus ENMT: Mucous membranes moist NECK: supple no meningeal signs SPINE:entire spine nontender CV: S1/S2 noted, no murmurs/rubs/gallops noted LUNGS: Lungs are clear to auscultation bilaterally, no apparent distress ABDOMEN: soft, moderate RUQ tenderness, no rebound or guarding GU:no cva tenderness NEURO: Pt is awake/alert, moves all extremitiesx4, no arm/leg drift.  He is conversant and in no distress EXTREMITIES: pulses normal, full ROM, no calf tenderness noted.   SKIN: warm, color normal PSYCH: no abnormalities of mood noted  ED Course  Procedures  9:29 PM Pt here with multiple issues including syncope, abd pain and HA He is hyperglycemic His EKG is unchanged Due to focal abd tenderness will obtain RUQ ultasound 10:18 PM RUQ [place] negative Will admit for further workup of his syncopal episode and control his glucose Pt agreeable D/w [facility], will admit He denies CP at this time  Labs Review Labs Reviewed  CBC - Abnormal; Notable for the following:    WBC 11.3 (*)    All other components within normal limits  COMPREHENSIVE METABOLIC PANEL - Abnormal;  Notable for the following:    Sodium 136 (*)    Glucose, Bld 303 (*)    BUN 26 (*)    Total Bilirubin <0.2 (*)    Anion gap 16 (*)    All other components within normal limits  CBG MONITORING, ED - Abnormal; Notable for the following:    Glucose-Capillary 301 (*)    All other components within normal limits  PRO B NATRIURETIC PEPTIDE  URINALYSIS, ROUTINE W REFLEX MICROSCOPIC  LIPASE, BLOOD  I-STAT TROPOININ, ED    Imaging Review Dg Chest Portable  1 View  05/30/2014   CLINICAL DATA:  Mid chest pain, dizziness, syncopal episode, nausea. History of diabetes and smoking.  EXAM: PORTABLE CHEST - 1 VIEW  COMPARISON:  07/01/2011; 08/24/2008; 05/13/2008  FINDINGS: Grossly unchanged cardiac silhouette and mediastinal contours. Grossly unchanged right basilar linear heterogeneous opacities favored to represent atelectasis or scar. No new focal airspace opacities. No pleural effusion or pneumothorax. No evidence of edema. No acute osseus abnormalities.  IMPRESSION: Right basilar atelectasis / scar without acute cardiopulmonary disease.   Electronically Signed   By: Sandi Mariscal M.D.   On: 05/30/2014 20:15     EKG Interpretation   Date/Time:  Monday May 30 2014 18:42:09 EDT Ventricular Rate:  85 PR Interval:  174 QRS Duration: 96 QT Interval:  375 QTC Calculation: 446 R Axis:   106 Text Interpretation:  Sinus rhythm Consider right ventricular hypertrophy  Abnormal inferior Q waves ST elev, probable normal early repol pattern No  significant change since last tracing Confirmed by Christy Gentles  MD, [place]  [phone]) on 05/30/2014 6:51:17 PM      MDM   Final diagnoses:  Syncope, unspecified syncope type  RUQ abdominal pain  Hyperglycemia    Nursing notes including past medical history and social history reviewed and considered in documentation xrays reviewed and considered Labs/vital reviewed and considered     Sharyon Cable, MD 05/30/14 2219

## 2014-05-30 NOTE — ED Notes (Signed)
Pt asked to provide urine sample. PT states that he cannot at this time.

## 2014-05-30 NOTE — ED Notes (Addendum)
Patient states that he started feeling bad Sunday morning with diaphoresis and dizziness. States got worse This morning around 11 am at work. Then stated nausea and dizziness that got worse at work. Patient states he sat down and lost consciousness and when he was aware again he was on Ambulance with EMS.  EMS states that patient was awake when they arrived but was not aware. Regained alertness on ambulance.  EMS gave 4 mg of Zofran with relief of nausea.

## 2014-05-30 NOTE — H&P (Signed)
[facility] History and Physical  Cody Harper:[id number] DOB: 02-20-1966 DOA: 05/30/2014  Referring physician: ED physician PCP: No PCP Per Patient   Chief Complaint: syncope   HPI:  Pt is 48 yo male with no PCP but known DM and HTN but has not taken medications in some time. Has had syncopal event earlier at work and denies similar events in the past, no chest pain or shortness of breath, no abd or urinary concerns. Pt also denies any fevers, chills, no neurological symptoms.   In Ed pt noted to be hemodynamically stable, [facility] asked to admit to telemetry bed for observation and evaluation.   Assessment and Plan: Active Problems: Syncope - unclear etiology - will admit to telemetry unit - will check orthostatic vitals - check TSH, troponins, BNP - obtain 12 lead EKG, check UDS Diabetes mellitus type II with no known complications  - not taking any medications - will check A1C - place on SSI for now and request diabetic educator consultation  HTN - BP on admission 130/80 HLD - check lipid panel Morbid obesity  - nutritionist consultation  RLE swelling - LE doppler to rule out DVT requested   Lovenox for DVT prophylaxis   Radiological Exams on Admission:  Dg Chest Portable 1 View  05/30/2014   Right basilar atelectasis / scar without acute cardiopulmonary disease.     Code Status: Full Family Communication: Pt at bedside Disposition Plan: Admit for further evaluation    Review of Systems:  Constitutional: Negative for fever, chills and malaise/fatigue. Negative for diaphoresis.  HENT: Negative for hearing loss, ear pain, nosebleeds, congestion, sore throat, neck pain, tinnitus and ear discharge.   Eyes: Negative for blurred vision, double vision, photophobia, pain, discharge and redness.  Respiratory: Negative for cough, hemoptysis, wheezing and stridor.   Cardiovascular: Negative for chest pain, palpitations, orthopnea.  Gastrointestinal: Negative for  nausea, vomiting and abdominal pain.  Genitourinary: Negative for dysuria, urgency, frequency, hematuria and flank pain.  Musculoskeletal: Negative for myalgias, back pain,.  Skin: Negative for itching and rash.  Neurological: Negative for speech change, focal weakness, and headaches.  Endo/Heme/Allergies: Negative for environmental allergies and polydipsia. Does not bruise/bleed easily.  Psychiatric/Behavioral: Negative for suicidal ideas. The patient is not nervous/anxious.      Past Medical History  Diagnosis Date  . Diabetes mellitus   . 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 Appendectomy      Social History:  reports that he has been smoking Cigarettes.  He has a 30 pack-year smoking history. He does not have any smokeless tobacco history on file. He reports that he does not drink alcohol or use illicit drugs.  No Known Allergies  Family History  Problem Relation Age of Onset  . Cancer Mother     Prior to Admission medications   Not on File    Physical Exam: Filed Vitals:   05/30/14 1900 05/30/14 2000 05/30/14 2045 05/30/14 2047  BP: 121/79 135/89 139/91 131/80  Pulse: 83 79 84 81  Temp:      TempSrc:      Resp: 20 17 26 22   Height:      Weight:      SpO2: 94% 95% 94% 96%    Physical Exam  Constitutional: Appears well-developed and well-nourished. No distress.  HENT: Normocephalic. External right and left ear normal. Oropharynx is clear and moist.  Eyes: Conjunctivae and  EOM are normal. PERRLA, no scleral icterus.  Neck: Normal ROM. Neck supple. No JVD. No tracheal deviation. No thyromegaly.  CVS: RRR, S1/S2 +, no murmurs, no gallops, no carotid bruit.  Pulmonary: Effort and breath sounds normal, no stridor, rhonchi, wheezes, rales.  Abdominal: Soft. BS +,  no distension, tenderness, rebound or guarding.  Musculoskeletal: Normal range of motion. Right LE swelling with chronic venous  insufficiency  Lymphadenopathy: No lymphadenopathy noted, cervical, inguinal. Neuro: Alert. Normal reflexes, muscle tone coordination. No cranial nerve deficit. Skin: Skin is warm and dry. No rash noted. Not diaphoretic. No erythema. No pallor.  Psychiatric: Normal mood and affect. Behavior, judgment, thought content normal.   Labs on Admission:  Basic Metabolic Panel:  Recent Labs Lab 05/30/14 1854  NA 136*  K 5.1  CL 99  CO2 21  GLUCOSE 303*  BUN 26*  CREATININE 0.92  CALCIUM 9.8   Liver Function Tests:  Recent Labs Lab 05/30/14 1854  AST 18  ALT 22  ALKPHOS 101  BILITOT <0.2*  PROT 7.5  ALBUMIN 4.3    Recent Labs Lab 05/30/14 1854  LIPASE 32   CBC:  Recent Labs Lab 05/30/14 1854  WBC 11.3*  HGB 16.3  HCT 46.4  MCV 87.2  PLT 259   CBG:  Recent Labs Lab 05/30/14 1849  GLUCAP 301*    EKG: Normal sinus rhythm, no ST/T wave changes  Faye Ramsay, MD  [facility] Pager [phone]  If 7PM-7AM, please contact night-coverage [email or web address] Password [facility] 05/30/2014, 10:18 PM

## 2014-05-31 DIAGNOSIS — F17200 Nicotine dependence, unspecified, uncomplicated: Secondary | ICD-10-CM | POA: Diagnosis present

## 2014-05-31 DIAGNOSIS — E669 Obesity, unspecified: Secondary | ICD-10-CM

## 2014-05-31 DIAGNOSIS — E1165 Type 2 diabetes mellitus with hyperglycemia: Principal | ICD-10-CM

## 2014-05-31 DIAGNOSIS — M79604 Pain in right leg: Secondary | ICD-10-CM | POA: Diagnosis present

## 2014-05-31 DIAGNOSIS — M79609 Pain in unspecified limb: Secondary | ICD-10-CM

## 2014-05-31 DIAGNOSIS — E785 Hyperlipidemia, unspecified: Secondary | ICD-10-CM

## 2014-05-31 DIAGNOSIS — IMO0001 Reserved for inherently not codable concepts without codable children: Principal | ICD-10-CM

## 2014-05-31 DIAGNOSIS — I8390 Asymptomatic varicose veins of unspecified lower extremity: Secondary | ICD-10-CM

## 2014-05-31 HISTORY — DX: Nicotine dependence, unspecified, uncomplicated: F17.200

## 2014-05-31 HISTORY — DX: Hyperlipidemia, unspecified: E78.5

## 2014-05-31 HISTORY — DX: Obesity, unspecified: E66.9

## 2014-05-31 LAB — TROPONIN I: Troponin I: <0.3 ng/mL (ref <0.30)

## 2014-05-31 LAB — TSH: TSH: 5.31 u[IU]/mL — ABNORMAL HIGH (ref 0.350–4.500)

## 2014-05-31 LAB — CBC
HCT: 42.2 % (ref 39.0–52.0)
HEMOGLOBIN: 14.3 g/dL (ref 13.0–17.0)
MCH: 30 pg (ref 26.0–34.0)
MCHC: 33.9 g/dL (ref 30.0–36.0)
MCV: 88.5 fL (ref 78.0–100.0)
PLATELETS: 249 10*3/uL (ref 150–400)
RBC: 4.77 MIL/uL (ref 4.22–5.81)
RDW: 13.7 % (ref 11.5–15.5)
WBC: 9.9 10*3/uL (ref 4.0–10.5)

## 2014-05-31 LAB — BASIC METABOLIC PANEL
Anion gap: 12 (ref 5–15)
BUN: 22 mg/dL (ref 6–23)
CO2: 25 mEq/L (ref 19–32)
Calcium: 9 mg/dL (ref 8.4–10.5)
Chloride: 102 mEq/L (ref 96–112)
Creatinine, Ser: 0.75 mg/dL (ref 0.50–1.35)
GFR calc Af Amer: >90 mL/min (ref >90)
GLUCOSE: 213 mg/dL — AB (ref 70–99)
POTASSIUM: 4.1 meq/L (ref 3.7–5.3)
Sodium: 139 mEq/L (ref 137–147)

## 2014-05-31 LAB — HEMOGLOBIN A1C
Hgb A1c MFr Bld: 10.5 % — ABNORMAL HIGH (ref <5.7)
Mean Plasma Glucose: 255 mg/dL — ABNORMAL HIGH (ref <117)

## 2014-05-31 LAB — GLUCOSE, CAPILLARY
Glucose-Capillary: 207 mg/dL — ABNORMAL HIGH (ref 70–99)
Glucose-Capillary: 255 mg/dL — ABNORMAL HIGH (ref 70–99)
Glucose-Capillary: 207 mg/dL — ABNORMAL HIGH (ref 70–99)
Glucose-Capillary: 157 mg/dL — ABNORMAL HIGH (ref 70–99)

## 2014-05-31 LAB — RAPID URINE DRUG SCREEN, HOSP PERFORMED
Amphetamines: NOT DETECTED
Barbiturates: NOT DETECTED
Benzodiazepines: NOT DETECTED
COCAINE: NOT DETECTED
Opiates: POSITIVE — AB
Tetrahydrocannabinol: NOT DETECTED

## 2014-05-31 LAB — PRO B NATRIURETIC PEPTIDE: PRO B NATRI PEPTIDE: 13.6 pg/mL (ref 0–125)

## 2014-05-31 LAB — LIPID PANEL
CHOL/HDL RATIO: 5.8 ratio
Cholesterol: 232 mg/dL — ABNORMAL HIGH (ref 0–200)
HDL: 40 mg/dL (ref >39)
LDL Cholesterol: 125 mg/dL — ABNORMAL HIGH (ref 0–99)
Triglycerides: 336 mg/dL — ABNORMAL HIGH (ref <150)
VLDL: 67 mg/dL — AB (ref 0–40)

## 2014-05-31 LAB — MRSA PCR SCREENING: MRSA BY PCR: POSITIVE — AB

## 2014-05-31 MED ORDER — ZOLPIDEM TARTRATE 5 MG PO TABS
5.0000 mg | ORAL_TABLET | Freq: Every evening | ORAL | Status: DC | PRN
  Administered 2014-05-31: 5 mg via ORAL
  Filled 2014-05-31: qty 1

## 2014-05-31 MED ORDER — SODIUM CHLORIDE 0.9 % IV SOLN
INTRAVENOUS | Status: AC
  Administered 2014-05-31: 13:00:00 via INTRAVENOUS

## 2014-05-31 MED ORDER — ACETAMINOPHEN 325 MG PO TABS: 650.0000 mg | ORAL_TABLET | Freq: Four times a day (QID) | ORAL | Status: DC | PRN

## 2014-05-31 MED ORDER — HYDROCODONE-ACETAMINOPHEN 5-325 MG PO TABS
1.0000 | ORAL_TABLET | Freq: Four times a day (QID) | ORAL | Status: DC | PRN
  Administered 2014-05-31 – 2014-06-01 (×4): 2 via ORAL
  Filled 2014-05-31 (×4): qty 2

## 2014-05-31 MED ORDER — LIVING WELL WITH DIABETES BOOK
Freq: Once | Status: AC
  Administered 2014-05-31: 14:00:00
  Filled 2014-05-31: qty 1

## 2014-05-31 MED ORDER — GLIMEPIRIDE 4 MG PO TABS
4.0000 mg | ORAL_TABLET | Freq: Every day | ORAL | Status: DC
  Administered 2014-05-31 – 2014-06-01 (×2): 4 mg via ORAL
  Filled 2014-05-31 (×3): qty 1

## 2014-05-31 MED ORDER — METFORMIN HCL 500 MG PO TABS
500.0000 mg | ORAL_TABLET | Freq: Two times a day (BID) | ORAL | Status: DC
  Administered 2014-05-31 – 2014-06-01 (×2): 500 mg via ORAL
  Filled 2014-05-31 (×4): qty 1

## 2014-05-31 MED ORDER — HYDROMORPHONE HCL PF 1 MG/ML IJ SOLN
1.0000 mg | INTRAMUSCULAR | Status: DC | PRN
Start: 2014-05-31 — End: 2014-06-01
  Administered 2014-05-31 (×3): 1 mg via INTRAVENOUS
  Filled 2014-05-31 (×3): qty 1

## 2014-05-31 NOTE — Plan of Care (Signed)
Problem: Limited Adherence to Nutrition-Related Recommendations (NB-1.6) Goal: Nutrition education Formal process to instruct or train a patient/client in a skill or to impart knowledge to help patients/clients voluntarily manage or modify food choices and eating behavior to maintain or improve health. Outcome: Completed/Met Date Met:  05/31/14  RD consulted for nutrition education regarding diabetes.     Lab Results  Component Value Date    HGBA1C  Value: 10.0 (NOTE)                                                                       According to the ADA Clinical Practice Recommendations for 2011, when HbA1c is used as a screening test:   >=6.5%   Diagnostic of Diabetes Mellitus           (if abnormal result  is confirmed)  5.7-6.4%   Increased risk of developing Diabetes Mellitus  References:Diagnosis and Classification of Diabetes Mellitus,Diabetes GURK,2706,23(JSEGB 1):S62-S69 and Standards of Medical Care in         Diabetes - 2011,Diabetes Care,2011,34  (Suppl 1):S11-S61.* 03/14/2011    RD provided "Carbohydrate Counting for People with Diabetes" handout from the Academy of Nutrition and Dietetics. Discussed different food groups and their effects on blood sugar, emphasizing carbohydrate-containing foods. Provided list of carbohydrates and recommended serving sizes of common foods.  Discussed importance of controlled and consistent carbohydrate intake throughout the day. Provided examples of ways to balance meals/snacks and encouraged intake of high-fiber, whole grain complex carbohydrates. Teach back method used.  Expect fair compliance.  Body mass index is 37.34 kg/(m^2). Pt meets criteria for Obesity Class II based on current BMI.  Current diet order is Regular.  Patient reports a good appetite.  Labs and medications reviewed. No further nutrition interventions warranted at this time. If additional nutrition issues arise, please re-consult RD.  Recommend diet order change to  Carbohydrate Modified.  Arthur Holms, RD, LDN Pager #: [phone] After-Hours Pager #: [phone]

## 2014-05-31 NOTE — Care Management Note (Signed)
    Page 1 of 1   06/01/2014     2:09:05 PM CARE MANAGEMENT NOTE 06/01/2014  Patient:  Cody Harper, Cody Harper   Account Number:  [id number]  Date Initiated:  05/31/2014  Documentation initiated by:  Topher Buenaventura  Subjective/Objective Assessment:   Pt adm on 05/30/14 with syncope, Rt leg pain.  PTA, pt independent of ADLS.     Action/Plan:   Will follow for dc needs as pt progresses.   Anticipated DC Date:  06/01/2014   Anticipated DC Plan:  [place]  CM consult  PCP issues      Choice offered to / List presented to:             Status of service:  Completed, signed off Medicare Important Message given?   (If response is "NO", the following Medicare IM given date fields will be blank) Date Medicare IM given:   Medicare IM given by:   Date Additional Medicare IM given:   Additional Medicare IM given by:    Discharge Disposition:  HOME/SELF CARE  Per UR Regulation:  Reviewed for med. necessity/level of care/duration of stay  If discussed at [place] of Stay Meetings, dates discussed:    Comments:  06/01/14 Ellan Lambert, RN, BSN [phone] Pt has no PCP; has [employer].  Pt given information on [facility] Physician Referral Service and Phone #. He states he will call ASAP to schedule an appt within a month of dc.

## 2014-05-31 NOTE — Progress Notes (Signed)
Inpatient Diabetes Program Recommendations  AACE/ADA: New Consensus Statement on Inpatient Glycemic Control (2013)  Target Ranges:  Prepandial:   less than 140 mg/dL      Peak postprandial:   less than 180 mg/dL (1-2 hours)      Critically ill patients:  140 - 180 mg/dL  Results for Cody Harper, Cody Harper (MRN [id number]) as of 05/31/2014 17:39  Ref. Range 05/30/2014 18:49 05/30/2014 23:48 05/31/2014 06:32 05/31/2014 11:26 05/31/2014 16:27  Glucose-Capillary Latest Range: 70-99 mg/dL 301 (H) 230 (H) 207 (H) 255 (H) 207 (H)    Inpatient Diabetes Program Recommendations Insulin - Basal: consider adding low dose Lantus or Levemir HgbA1C: =10.5  Note: This coordinator met with patient and his wife to discuss A1C=10.5 and home management of DM.  Pt reports he once was taking Metformin but his "sugar got better" so he quit taking it.  Encouraged patient to make lifestyle changes with healthy eating and exercise and to monitor his glucose at home daily.  Discussed how diabetes is a progressive disease and sometimes insulin is required even with T-2 DM.  Basic carb counting was reviewed and a Diabetes Meal Planning Guide was given to the patient.  Patient has Living Well with Diabetes pt education workbook at bedside and will be viewing the diabetes videos on the pt ed network.  No further questions/concerns at the end of our visit.  Thank you  Raoul Pitch BSN, RN,CDE Inpatient Diabetes Coordinator [phone] (team pager)

## 2014-05-31 NOTE — Progress Notes (Signed)
[facility] PROGRESS NOTE  Cody Harper EXH:[id number] DOB: 1965-11-07 DOA: 05/30/2014 PCP: No PCP Per Patient  Assessment/Plan:  Principal Problem:   Syncope: suspect related to uncontrolled DM, and resulting volume depletion based on increased FYB:OFBPZWCHEN ratio on admission. Continue tele. Increase IVF to 125 cc/hr Active Problems:   Diabetes type 2, uncontrolled: had been on metformin "many years ago" will start metformin and amaryl. Doubt compliant enough for home insulin. Change diet to carb mod/heart healthy   Vein, varicose   Right leg pain: await leg doppler. Pt reports acute on chronic pain and swelling.   Other and unspecified hyperlipidemia: will need statin at discharge   Obesity, unspecified   Tobacco use disorder, counselled against   Code Status:  full Family Communication:   Disposition Plan:  home  Consultants:    Procedures:     Antibiotics:    HPI/Subjective: C/o right leg pain. Chronic for years, but worse, especially in thigh and groin over the past few months. Feels dizzy with standing still. No CP. Slight dyspnea, which he attributes to  smoking  Objective: Filed Vitals:   05/31/14 0438  BP: 125/82  Pulse: 67  Temp: 98.4 F (36.9 C)  Resp: 18    Intake/Output Summary (Last 24 hours) at 05/31/14 1241 Last data filed at 05/31/14 1134  Gross per 24 hour  Intake    830 ml  Output    375 ml  Net    455 ml   Filed Weights   05/30/14 1835 05/30/14 2328 05/31/14 0438  Weight: 131.543 kg (290 lb) 139.1 kg (306 lb 10.6 oz) 139.1 kg (306 lb 10.6 oz)    Exam:   General:  Asleep. Arousable. Oriented. comfortable  Cardiovascular: RRR without MGR  Respiratory: CTA without WRR  Abdomen: obese, s, nt, nd  Ext: right leg bigger than left with varicosities, hyperpigmentation  Basic Metabolic Panel:  Recent Labs Lab 05/30/14 1854 05/31/14 0450  NA 136* 139  K 5.1 4.1  CL 99 102  CO2 21 25  GLUCOSE 303* 213*  BUN 26*  22  CREATININE 0.92 0.75  CALCIUM 9.8 9.0   Liver Function Tests:  Recent Labs Lab 05/30/14 1854  AST 18  ALT 22  ALKPHOS 101  BILITOT <0.2*  PROT 7.5  ALBUMIN 4.3    Recent Labs Lab 05/30/14 1854  LIPASE 32   No results found for this basename: AMMONIA,  in the last 168 hours CBC:  Recent Labs Lab 05/30/14 1854 05/31/14 0450  WBC 11.3* 9.9  HGB 16.3 14.3  HCT 46.4 42.2  MCV 87.2 88.5  PLT 259 249   Cardiac Enzymes:  Recent Labs Lab 05/30/14 0019 05/31/14 0450  TROPONINI <0.30 <0.30   BNP (last 3 results)  Recent Labs  05/30/14 0019 05/30/14 1846  PROBNP 13.6 20.1   CBG:  Recent Labs Lab 05/30/14 1849 05/30/14 2348 05/31/14 0632 05/31/14 1126  GLUCAP 301* 230* 207* 255*    Recent Results (from the past 240 hour(s))  MRSA PCR SCREENING     Status: Abnormal   Collection Time    05/30/14 11:24 PM      Result Value Ref Range Status   MRSA by PCR POSITIVE (*) NEGATIVE Final   Comment:            The GeneXpert MRSA Assay (FDA     approved for NASAL specimens     only), is one component of a     comprehensive MRSA colonization  surveillance program. It is not     intended to diagnose MRSA     infection nor to guide or     monitor treatment for     MRSA infections.     RESULT CALLED TO, READ BACK BY AND VERIFIED WITH:     AGUIRRE,R RN 0124 05/31/14 MITCHELL,L     Studies: Dg Chest Portable 1 View  05/30/2014   CLINICAL DATA:  Mid chest pain, dizziness, syncopal episode, nausea. History of diabetes and smoking.  EXAM: PORTABLE CHEST - 1 VIEW  COMPARISON:  07/01/2011; 08/24/2008; 05/13/2008  FINDINGS: Grossly unchanged cardiac silhouette and mediastinal contours. Grossly unchanged right basilar linear heterogeneous opacities favored to represent atelectasis or scar. No new focal airspace opacities. No pleural effusion or pneumothorax. No evidence of edema. No acute osseus abnormalities.  IMPRESSION: Right basilar atelectasis / scar without  acute cardiopulmonary disease.   Electronically Signed   By: Sandi Mariscal M.D.   On: 05/30/2014 20:15   US Abdomen Limited Ruq  05/30/2014   CLINICAL DATA:  Right upper quadrant abdominal pain.  EXAM: US ABDOMEN LIMITED - RIGHT UPPER QUADRANT  COMPARISON:  None.  FINDINGS: Gallbladder:  There is a punctate (approximately 0.6 cm) echogenic adherent stone versus polyp within otherwise normal-appearing gallbladder. No gallbladder wall thickening or pericholecystic fluid. Negative sonographic Murphy's sign  Common bile duct:  Diameter: Normal in size measuring 4.5 mm in diameter  Liver:  There is diffuse increased slightly coarsened echogenicity of the hepatic parenchyma suggestive of hepatic steatosis. There is a slightly ill-defined approximately 2.8 x 1.1 cm of relative hypo echogenicity adjacent to the gallbladder (image 34). No intrahepatic biliary duct dilatation. No ascites.  IMPRESSION: 1. No evidence of cholecystitis. 2. Punctate (approximately 0.6 cm) adherent stone versus polyp within otherwise normal-appearing gallbladder. Further evaluation with follow-up ultrasound in 1 year is recommended to ensure stability or document resolution. 3. Hepatic steatosis. Correlation with LFTs is recommended. Geographic area of hypo echogenicity adjacent to the gallbladder fossa is favored to represent an area of focal fatty sparing though further evaluation could be performed with nonemergent abdominal MRI as clinically indicated.   Electronically Signed   By: Sandi Mariscal M.D.   On: 05/30/2014 21:30    Scheduled Meds: . enoxaparin (LOVENOX) injection  70 mg Subcutaneous QHS  . insulin aspart  0-15 Units Subcutaneous TID WC  . insulin aspart  0-5 Units Subcutaneous QHS  . insulin aspart  3 Units Subcutaneous TID WC  . sodium chloride  3 mL Intravenous Q12H   Continuous Infusions:   Time spent: 35 minutes  [place] [facility] Pager [phone]. If 7PM-7AM, please contact night-coverage at  [email or web address], password [facility] 05/31/2014, 12:41 PM  LOS: 1 day

## 2014-05-31 NOTE — Progress Notes (Signed)
Utilization review completed. Coston Mandato, RN, BSN. 

## 2014-05-31 NOTE — Progress Notes (Addendum)
*  PRELIMINARY RESULTS* Vascular Ultrasound Right lower extremity venous duplex has been completed.  Preliminary findings: No evidence of DVT. Multiple varicose veins noted in right lower leg with thrombosed varicosities at mid shin area.  This is also noted in previous study from 11/18/13.   Landry Mellow, RDMS, RVT  05/31/2014, 3:11 PM

## 2014-06-01 DIAGNOSIS — E669 Obesity, unspecified: Secondary | ICD-10-CM

## 2014-06-01 DIAGNOSIS — I82890 Acute embolism and thrombosis of other specified veins: Secondary | ICD-10-CM

## 2014-06-01 DIAGNOSIS — F17200 Nicotine dependence, unspecified, uncomplicated: Secondary | ICD-10-CM

## 2014-06-01 HISTORY — DX: Acute embolism and thrombosis of other specified veins: I82.890

## 2014-06-01 LAB — GLUCOSE, CAPILLARY
GLUCOSE-CAPILLARY: 180 mg/dL — AB (ref 70–99)
Glucose-Capillary: 167 mg/dL — ABNORMAL HIGH (ref 70–99)

## 2014-06-01 LAB — URINE CULTURE
Colony Count: NO GROWTH
Culture: NO GROWTH

## 2014-06-01 MED ORDER — ACETAMINOPHEN 325 MG PO TABS: 650.0000 mg | ORAL_TABLET | Freq: Four times a day (QID) | ORAL | Status: DC | PRN

## 2014-06-01 MED ORDER — ASPIRIN EC 81 MG PO TBEC: 81.0000 mg | DELAYED_RELEASE_TABLET | Freq: Every day | ORAL | Status: DC

## 2014-06-01 MED ORDER — HYDROCODONE-ACETAMINOPHEN 5-325 MG PO TABS: 1.0000 | ORAL_TABLET | Freq: Four times a day (QID) | ORAL | Status: DC | PRN

## 2014-06-01 MED ORDER — GLIMEPIRIDE 4 MG PO TABS: 4.0000 mg | ORAL_TABLET | Freq: Every day | ORAL | Status: DC

## 2014-06-01 MED ORDER — PRAVASTATIN SODIUM 20 MG PO TABS: 20.0000 mg | ORAL_TABLET | Freq: Every day | ORAL | Status: DC

## 2014-06-01 MED ORDER — METFORMIN HCL 500 MG PO TABS: 500.0000 mg | ORAL_TABLET | Freq: Two times a day (BID) | ORAL | Status: DC

## 2014-06-01 NOTE — Progress Notes (Signed)
Mr. Cody Harper is unable to work from 05/30/14 through 06/04/14 due to illness.   Sincerely,    Doree Barthel, MD [facility]

## 2014-06-01 NOTE — Progress Notes (Signed)
Discharge education, medication, prescriptions, and instructions given to pt and reviewed, pt stated understanding and that he had no questions, IV and tele removed, diabetes education given to pt Rickard Rhymes, RN

## 2014-06-01 NOTE — Discharge Summary (Signed)
Physician Discharge Summary  Cody Harper LZJ:[id number] DOB: 09/21/1966 DOA: 05/30/2014  PCP: No PCP Per Patient  Admit date: 05/30/2014 Discharge date: 06/01/2014  Time spent: greater than 30 min  Recommendations for Outpatient Follow-up:  1. Optimize diabetes control 2. Wear compression stocking on right leg  Discharge Diagnoses:  Principal Problem:   Syncope Active Problems:   Diabetes type 2, uncontrolled   Vein, varicose   Right leg pain   Other and unspecified hyperlipidemia   Obesity, unspecified   Tobacco use disorder   Superficial vein thrombosis   Discharge Condition: stable  Filed Weights   05/30/14 2328 05/31/14 0438 06/01/14 0548  Weight: 139.1 kg (306 lb 10.6 oz) 139.1 kg (306 lb 10.6 oz) 142.702 kg (314 lb 9.6 oz)    History of present illness:  48 yo male with no PCP but known DM and HTN but has not taken medications in some time. Has had syncopal event earlier at work and denies similar events in the past, no chest pain or shortness of breath, no abd or urinary concerns. Pt also denies any fevers, chills, no neurological symptoms.    Hospital Course:  On admission, blood glucose >300, KWI:OXBDZ ratio > 20. Described orthostatic dizziness, polyuria, polydipsia prior to admission. Suspect syncope related to intravascular volume depletion from hyerglycemia.  Started on metformin, amaryl with good results. IV hydration. Complained of chronic right leg pain and swelling during during admission.  Doppler legs showed no DVT, positive for chronic thrombosis of varicose vein. By discharge, CBGs and symptoms much improved. Noted to have LDL 125, so started on statin. Also low dose ASA, and encouraged to quit smoking. Wear ted hose.   Procedures:  none  Consultations:  none  Discharge Exam: Filed Vitals:   06/01/14 0548  BP: 125/84  Pulse: 63  Temp: 97.4 F (36.3 C)  Resp: 17    General: a and o Cardiovascular: RRR Respiratory: CTA Ext right leg  with hyperpigmentation and varicose veins  Discharge Instructions   Diet - low sodium heart healthy    Complete by:  As directed      Diet Carb Modified    Complete by:  As directed      Discharge instructions    Complete by:  As directed   Place compression wraps on right leg when out of bed     Increase activity slowly    Complete by:  As directed             Medication List         acetaminophen 325 MG tablet  Commonly known as:  TYLENOL  Take 2 tablets (650 mg total) by mouth every 6 (six) hours as needed for mild pain.     aspirin EC 81 MG tablet  Take 1 tablet (81 mg total) by mouth daily.     glimepiride 4 MG tablet  Commonly known as:  AMARYL  Take 1 tablet (4 mg total) by mouth daily with breakfast.     HYDROcodone-acetaminophen 5-325 MG per tablet  Commonly known as:  NORCO/VICODIN  Take 1 tablet by mouth every 6 (six) hours as needed for severe pain.     metFORMIN 500 MG tablet  Commonly known as:  GLUCOPHAGE  Take 1 tablet (500 mg total) by mouth 2 (two) times daily with a meal. For 2 weeks, then 2 tablets twice daily     pravastatin 20 MG tablet  Commonly known as:  PRAVACHOL  Take 1 tablet (20 mg  total) by mouth daily.       No Known Allergies     Follow-up Information   Follow up with primary care provider  In 2 weeks. (to monitor diabetes, cholesterol)        The results of significant diagnostics from this hospitalization (including imaging, microbiology, ancillary and laboratory) are listed below for reference.    Significant Diagnostic Studies:  EKG Sinus rhythm Consider right ventricular hypertrophy Abnormal inferior Q waves ST elev, probable normal early repol pattern Doppler right leg No evidence of deep vein thrombosis involving the right lower extremity. - Multiple varicose veins noted in right lower leg with thrombosed varicosities at mid shin area.    Dg Chest Portable 1 View  05/30/2014   CLINICAL DATA:  Mid chest pain,  dizziness, syncopal episode, nausea. History of diabetes and smoking.  EXAM: PORTABLE CHEST - 1 VIEW  COMPARISON:  07/01/2011; 08/24/2008; 05/13/2008  FINDINGS: Grossly unchanged cardiac silhouette and mediastinal contours. Grossly unchanged right basilar linear heterogeneous opacities favored to represent atelectasis or scar. No new focal airspace opacities. No pleural effusion or pneumothorax. No evidence of edema. No acute osseus abnormalities.  IMPRESSION: Right basilar atelectasis / scar without acute cardiopulmonary disease.   Electronically Signed   By: Sandi Mariscal M.D.   On: 05/30/2014 20:15   US Abdomen Limited Ruq  05/30/2014   CLINICAL DATA:  Right upper quadrant abdominal pain.  EXAM: US ABDOMEN LIMITED - RIGHT UPPER QUADRANT  COMPARISON:  None.  FINDINGS: Gallbladder:  There is a punctate (approximately 0.6 cm) echogenic adherent stone versus polyp within otherwise normal-appearing gallbladder. No gallbladder wall thickening or pericholecystic fluid. Negative sonographic Murphy's sign  Common bile duct:  Diameter: Normal in size measuring 4.5 mm in diameter  Liver:  There is diffuse increased slightly coarsened echogenicity of the hepatic parenchyma suggestive of hepatic steatosis. There is a slightly ill-defined approximately 2.8 x 1.1 cm of relative hypo echogenicity adjacent to the gallbladder (image 34). No intrahepatic biliary duct dilatation. No ascites.  IMPRESSION: 1. No evidence of cholecystitis. 2. Punctate (approximately 0.6 cm) adherent stone versus polyp within otherwise normal-appearing gallbladder. Further evaluation with follow-up ultrasound in 1 year is recommended to ensure stability or document resolution. 3. Hepatic steatosis. Correlation with LFTs is recommended. Geographic area of hypo echogenicity adjacent to the gallbladder fossa is favored to represent an area of focal fatty sparing though further evaluation could be performed with nonemergent abdominal MRI as clinically  indicated.   Electronically Signed   By: Sandi Mariscal M.D.   On: 05/30/2014 21:30    Microbiology: Recent Results (from the past 240 hour(s))  URINE CULTURE     Status: None   Collection Time    05/30/14 10:25 PM      Result Value Ref Range Status   Specimen Description URINE, RANDOM   Final   Special Requests NONE   Final   Culture  Setup Time     Final   Value: 05/31/2014 00:12     Performed at [place]     Final   Value: NO GROWTH     Performed at [facility]   Culture     Final   Value: NO GROWTH     Performed at [facility]   Report Status 06/01/2014 FINAL   Final  MRSA PCR SCREENING     Status: Abnormal   Collection Time    05/30/14 11:24 PM  Result Value Ref Range Status   MRSA by PCR POSITIVE (*) NEGATIVE Final   Comment:            The GeneXpert MRSA Assay (FDA     approved for NASAL specimens     only), is one component of a     comprehensive MRSA colonization     surveillance program. It is not     intended to diagnose MRSA     infection nor to guide or     monitor treatment for     MRSA infections.     RESULT CALLED TO, READ BACK BY AND VERIFIED WITH:     AGUIRRE,R RN 0124 05/31/14 MITCHELL,L     Labs: Basic Metabolic Panel:  Recent Labs Lab 05/30/14 1854 05/31/14 0450  NA 136* 139  K 5.1 4.1  CL 99 102  CO2 21 25  GLUCOSE 303* 213*  BUN 26* 22  CREATININE 0.92 0.75  CALCIUM 9.8 9.0   Liver Function Tests:  Recent Labs Lab 05/30/14 1854  AST 18  ALT 22  ALKPHOS 101  BILITOT <0.2*  PROT 7.5  ALBUMIN 4.3    Recent Labs Lab 05/30/14 1854  LIPASE 32   No results found for this basename: AMMONIA,  in the last 168 hours CBC:  Recent Labs Lab 05/30/14 1854 05/31/14 0450  WBC 11.3* 9.9  HGB 16.3 14.3  HCT 46.4 42.2  MCV 87.2 88.5  PLT 259 249   Cardiac Enzymes:  Recent Labs Lab 05/30/14 0019 05/31/14 0450 05/31/14 1140  TROPONINI <0.30 <0.30 <0.30   BNP: BNP (last 3  results)  Recent Labs  05/30/14 0019 05/30/14 1846  PROBNP 13.6 20.1   CBG:  Recent Labs Lab 05/31/14 0632 05/31/14 1126 05/31/14 1627 05/31/14 2141 06/01/14 0645  GLUCAP 207* 255* 207* 157* 180*       Signed:  Jazelle Achey L  [facility] 06/01/2014, 10:21 AM

## 2014-07-21 ENCOUNTER — Encounter (HOSPITAL_COMMUNITY): Payer: Self-pay | Admitting: Emergency Medicine

## 2014-07-21 ENCOUNTER — Emergency Department (HOSPITAL_COMMUNITY)
Admission: EM | Admit: 2014-07-21 | Discharge: 2014-07-21 | Disposition: A | Discharge status: Home / Self Care | Payer: Self-pay | Attending: Emergency Medicine | Admitting: Emergency Medicine

## 2014-07-21 DIAGNOSIS — Z79899 Other long term (current) drug therapy: Secondary | ICD-10-CM | POA: Insufficient documentation

## 2014-07-21 DIAGNOSIS — I83811 Varicose veins of right lower extremities with pain: Secondary | ICD-10-CM | POA: Insufficient documentation

## 2014-07-21 DIAGNOSIS — Z720 Tobacco use: Secondary | ICD-10-CM | POA: Insufficient documentation

## 2014-07-21 DIAGNOSIS — Z7982 Long term (current) use of aspirin: Secondary | ICD-10-CM | POA: Insufficient documentation

## 2014-07-21 DIAGNOSIS — E119 Type 2 diabetes mellitus without complications: Secondary | ICD-10-CM | POA: Insufficient documentation

## 2014-07-21 DIAGNOSIS — I8390 Asymptomatic varicose veins of unspecified lower extremity: Secondary | ICD-10-CM

## 2014-07-21 MED ORDER — HYDROCODONE-ACETAMINOPHEN 5-325 MG PO TABS: 1.0000 | ORAL_TABLET | Freq: Four times a day (QID) | ORAL | Status: DC | PRN

## 2014-07-21 NOTE — ED Notes (Addendum)
Pt presents to ed with c/o painful right lower leg as well as "bump" of the right forehead which he thinks may be somehow connected to his leg issues. There is a quarter size swelling above his right temple. Pt is unsure how long he had it, sts noticed it first about one week ago.

## 2014-07-21 NOTE — ED Notes (Signed)
Per pt, states chronic right leg pain-has been seen for same symptoms-has not followed up with anyone

## 2014-07-21 NOTE — Discharge Instructions (Signed)
Compression Stockings °Compression stockings are elastic stockings that "compress" your legs. This helps to increase blood flow, decrease swelling, and reduces the chance of getting blood clots in your lower legs. Compression stockings are used: °· After surgery. °· If you have a history of poor circulation. °· If you are prone to blood clots. °· If you have varicose veins. °· If you sit or are bedridden for long periods of time. °WEARING COMPRESSION STOCKINGS °· Your compression stockings should be worn as instructed by your caregiver. °· Wearing the correct stocking size is important. Your caregiver can help measure and fit you to the correct size. °· When wearing your stockings, do not allow the stockings to bunch up. This is especially important around your toes or behind your knees. Keep the stockings as smooth as possible. °· Do not roll the stockings downward and leave them rolled down. This can form a restrictive band around your legs and can decrease blood flow. °· The stockings should be removed once a day for 1 hour or as instructed by your caregiver. When the stockings are taken off, inspect your legs and feet. Look for: °· Open sores. °· Red spots. °· Puffy areas (swelling). °· Anything that does not seem normal. °IMPORTANT INFORMATION ABOUT COMPRESSION STOCKINGS °· The compression stockings should be clean, dry, and in good condition before you put them on. °· Do not put lotion on your legs or feet. This makes it harder to put the stockings on. °· Change your stockings immediately if they become wet or soiled. °· Do not wear stockings that are ripped or torn. °· You may hand-wash or put your stockings in the washing machine. Use cold or warm water with mild detergent. Do not bleach your stockings. They may be air-dried or dried in the dryer on low heat. °· If you have pain or have a feeling of "pins and needles" in your feet or legs, you may be wearing stockings that are too tight. Call your caregiver  right away. °SEEK IMMEDIATE MEDICAL CARE IF:  °· You have numbness or tingling in your lower legs that does not get better quickly after the stockings are removed. °· Your toes or feet become cold and blue. °· You develop open sores or have red spots on your legs that do not go away. °MAKE SURE YOU:  °· Understand these instructions. °· Will watch your condition. °· Will get help right away if you are not doing well or get worse. °Document Released: 07/28/2009 Document Revised: 12/23/2011 Document Reviewed: 07/28/2009 °[employer]® Patient Information ©2015 [employer]. This information is not intended to replace advice given to you by your health care provider. Make sure you discuss any questions you have with your health care provider. ° °Varicose Veins °Varicose veins are veins that have become enlarged and twisted. °CAUSES °This condition is the result of valves in the veins not working properly. Valves in the veins help return blood from the leg to the heart. If these valves are damaged, blood flows backwards and backs up into the veins in the leg near the skin. This causes the veins to become larger. People who are on their feet a lot, who are pregnant, or who are overweight are more likely to develop varicose veins. °SYMPTOMS  °· Bulging, twisted-appearing, bluish veins, most commonly found on the legs. °· Leg pain or a feeling of heaviness. These symptoms may be worse at the end of the day. °· Leg swelling. °· Skin color changes. °DIAGNOSIS  °  Varicose veins can usually be diagnosed with an exam of your legs by your caregiver. He or she may recommend an ultrasound of your leg veins. °TREATMENT  °Most varicose veins can be treated at home. However, other treatments are available for people who have persistent symptoms or who want to treat the cosmetic appearance of the varicose veins. These include: °· Laser treatment of very small varicose veins. °· Medicine that is shot (injected) into the vein. This medicine  hardens the walls of the vein and closes off the vein. This treatment is called sclerotherapy. Afterwards, you may need to wear clothing or bandages that apply pressure. °· Surgery. °HOME CARE INSTRUCTIONS  °· Do not stand or sit in one position for long periods of time. Do not sit with your legs crossed. Rest with your legs raised during the day. °· Wear elastic stockings or support hose. Do not wear other tight, encircling garments around the legs, pelvis, or waist. °· Walk as much as possible to increase blood flow. °· Raise the foot of your bed at night with 2-inch blocks. °· If you get a cut in the skin over the vein and the vein bleeds, lie down with your leg raised and press on it with a clean cloth until the bleeding stops. Then place a bandage (dressing) on the cut. See your caregiver if it continues to bleed or needs stitches. °SEEK MEDICAL CARE IF:  °· The skin around your ankle starts to break down. °· You have pain, redness, tenderness, or hard swelling developing in your leg over a vein. °· You are uncomfortable due to leg pain. °Document Released: 07/10/2005 Document Revised: 12/23/2011 Document Reviewed: 11/26/2010 °[employer]® Patient Information ©2015 [employer]. This information is not intended to replace advice given to you by your health care provider. Make sure you discuss any questions you have with your health care provider. ° °

## 2014-07-21 NOTE — ED Provider Notes (Addendum)
CSN: [id number]     Arrival date & time 07/21/14  0746 History   First MD Initiated Contact with Patient 07/21/14 0802     Chief Complaint  Patient presents with  . Leg Pain     (Consider location/radiation/quality/duration/timing/severity/associated sxs/prior Treatment) Patient is a 48 y.o. male presenting with leg pain. The history is provided by the patient.  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 Diabetes mellitus   . 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 Appendectomy     Family History  Problem Relation Age of Onset  . Cancer Mother    History  Substance Use Topics  . Smoking status: Current Every Day Smoker -- 1.00 packs/day for 30 years    Types: Cigarettes  . Smokeless tobacco: Not on file  . Alcohol Use: No     Comment: occassionally    Review of Systems  Constitutional: Negative for fever.  Musculoskeletal: Negative for stiffness.  Skin:       Patient had also noticed a swollen area on the side of his right temple that he is unclear how long it has been there. It is nontender and has normal skin color  All other systems reviewed and are negative.     Allergies  Review of patient's allergies indicates no known allergies.  Home Medications   Prior to Admission  medications   Medication Sig Start Date End Date Taking? Authorizing Provider  acetaminophen (TYLENOL) 325 MG tablet Take 2 tablets (650 mg total) by mouth every 6 (six) hours as needed for mild pain. 06/01/14  Yes Delfina Redwood, MD  aspirin EC 81 MG tablet Take 1 tablet (81 mg total) by mouth daily. 06/01/14  Yes Delfina Redwood, MD  HYDROcodone-acetaminophen (NORCO/VICODIN) 5-325 MG per tablet Take 1 tablet by mouth every 6 (six) hours as needed for severe pain. 06/01/14  Yes Delfina Redwood, MD  Menthol, Topical Analgesic, (ICY HOT EX) Apply 1 application topically every 6 (six) hours as needed (pain).   Yes Historical Provider, MD  metFORMIN (GLUCOPHAGE) 500 MG tablet Take 1 tablet (500 mg total) by mouth 2 (two) times daily with a meal. For 2 weeks, then 2 tablets twice daily 06/01/14  Yes Delfina Redwood, MD  pravastatin (PRAVACHOL) 20 MG tablet Take 1 tablet (20 mg total) by mouth daily. 06/01/14  Yes Delfina Redwood, MD   BP 124/72  Pulse 78  Temp(Src) 97.8 F (36.6 C) (Oral)  Resp 18  SpO2 100% Physical Exam  Nursing note and vitals reviewed. Constitutional: He is oriented to person, place, and time. He appears well-developed and well-nourished. No distress.  HENT:  Head: Normocephalic and atraumatic.  Mouth/Throat: Oropharynx is clear and moist.  Eyes:  Conjunctivae and EOM are normal. Pupils are equal, round, and reactive to light.  Neck: Normal range of motion. Neck supple.  Cardiovascular: Normal rate and intact distal pulses.   Pulmonary/Chest: Effort normal.  Musculoskeletal: Normal range of motion. He exhibits tenderness. He exhibits no edema.  Severe varicose veins right lower extremity with swelling noted to the right lower extremity. No specific calf tenderness. 2+ DP and PT pulses on the right. Skin changes consistent with chronic venous stasis.  Neurological: He is alert and oriented to person, place, and time.  Skin: Skin is warm and dry. No rash noted. No  erythema.  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 No engorged vessels  Psychiatric: He has a normal mood and affect. His behavior is normal.    ED Course  Procedures (including critical care time) Labs Review Labs Reviewed - No data to display  Imaging Review No results found.   EKG Interpretation None      MDM   Final diagnoses:  Varicose vein of leg   Patient with a history of ongoing varicose veins with chronic venous thrombosis in the right leg who has worsening leg pain. 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 Patient also has a history of diabetes and hypertension and currently does not have a PCP. Patient given resources to obtain a PMD as well as medication for the pain.  Within the last 2 months patient has had the right leg Doppler that showed no signs of DVT and chronic venous thrombosis.  Blanchie Dessert, MD 07/21/14 9604  Blanchie Dessert, MD 07/21/14 [phone]

## 2014-08-08 ENCOUNTER — Ambulatory Visit: Payer: Self-pay | Admitting: Internal Medicine

## 2014-10-14 DIAGNOSIS — J189 Pneumonia, unspecified organism: Secondary | ICD-10-CM

## 2014-10-14 HISTORY — DX: Pneumonia, unspecified organism: J18.9

## 2014-11-11 ENCOUNTER — Emergency Department (HOSPITAL_COMMUNITY): Payer: Self-pay

## 2014-11-11 ENCOUNTER — Inpatient Hospital Stay (HOSPITAL_COMMUNITY)
Admission: EM | Admit: 2014-11-11 | Discharge: 2014-11-13 | DRG: 603 | Disposition: A | Discharge status: Home / Self Care | Payer: Self-pay | Attending: Internal Medicine | Admitting: Internal Medicine

## 2014-11-11 ENCOUNTER — Encounter (HOSPITAL_COMMUNITY): Payer: Self-pay | Admitting: Emergency Medicine

## 2014-11-11 DIAGNOSIS — Z8614 Personal history of Methicillin resistant Staphylococcus aureus infection: Secondary | ICD-10-CM

## 2014-11-11 DIAGNOSIS — M25529 Pain in unspecified elbow: Secondary | ICD-10-CM

## 2014-11-11 DIAGNOSIS — R200 Anesthesia of skin: Secondary | ICD-10-CM | POA: Diagnosis present

## 2014-11-11 DIAGNOSIS — Z79899 Other long term (current) drug therapy: Secondary | ICD-10-CM

## 2014-11-11 DIAGNOSIS — IMO0002 Reserved for concepts with insufficient information to code with codable children: Secondary | ICD-10-CM

## 2014-11-11 DIAGNOSIS — Z809 Family history of malignant neoplasm, unspecified: Secondary | ICD-10-CM

## 2014-11-11 DIAGNOSIS — R7989 Other specified abnormal findings of blood chemistry: Secondary | ICD-10-CM | POA: Diagnosis present

## 2014-11-11 DIAGNOSIS — Z9119 Patient's noncompliance with other medical treatment and regimen: Secondary | ICD-10-CM | POA: Diagnosis present

## 2014-11-11 DIAGNOSIS — L03115 Cellulitis of right lower limb: Principal | ICD-10-CM

## 2014-11-11 DIAGNOSIS — L03119 Cellulitis of unspecified part of limb: Secondary | ICD-10-CM

## 2014-11-11 DIAGNOSIS — Z79891 Long term (current) use of opiate analgesic: Secondary | ICD-10-CM

## 2014-11-11 DIAGNOSIS — Z23 Encounter for immunization: Secondary | ICD-10-CM

## 2014-11-11 DIAGNOSIS — Z7982 Long term (current) use of aspirin: Secondary | ICD-10-CM

## 2014-11-11 DIAGNOSIS — I8390 Asymptomatic varicose veins of unspecified lower extremity: Secondary | ICD-10-CM

## 2014-11-11 DIAGNOSIS — M79604 Pain in right leg: Secondary | ICD-10-CM

## 2014-11-11 DIAGNOSIS — F17200 Nicotine dependence, unspecified, uncomplicated: Secondary | ICD-10-CM | POA: Diagnosis present

## 2014-11-11 DIAGNOSIS — F17210 Nicotine dependence, cigarettes, uncomplicated: Secondary | ICD-10-CM | POA: Diagnosis present

## 2014-11-11 DIAGNOSIS — M779 Enthesopathy, unspecified: Secondary | ICD-10-CM | POA: Diagnosis present

## 2014-11-11 DIAGNOSIS — R29898 Other symptoms and signs involving the musculoskeletal system: Secondary | ICD-10-CM | POA: Diagnosis present

## 2014-11-11 DIAGNOSIS — R946 Abnormal results of thyroid function studies: Secondary | ICD-10-CM | POA: Diagnosis present

## 2014-11-11 DIAGNOSIS — E1165 Type 2 diabetes mellitus with hyperglycemia: Secondary | ICD-10-CM | POA: Diagnosis present

## 2014-11-11 HISTORY — DX: Other specified abnormal findings of blood chemistry: R79.89

## 2014-11-11 HISTORY — DX: Obesity, unspecified: E66.9

## 2014-11-11 HISTORY — DX: Hyperlipidemia, unspecified: E78.5

## 2014-11-11 HISTORY — DX: Type 2 diabetes mellitus with hyperglycemia: E11.65

## 2014-11-11 HISTORY — DX: Nicotine dependence, unspecified, uncomplicated: F17.200

## 2014-11-11 HISTORY — DX: Syncope and collapse: R55

## 2014-11-11 HISTORY — DX: Acute embolism and thrombosis of other specified veins: I82.890

## 2014-11-11 HISTORY — DX: Reserved for concepts with insufficient information to code with codable children: IMO0002

## 2014-11-11 HISTORY — DX: Cellulitis of right lower limb: L03.115

## 2014-11-11 HISTORY — DX: Asymptomatic varicose veins of unspecified lower extremity: I83.90

## 2014-11-11 HISTORY — DX: Type 2 diabetes mellitus with foot ulcer: E11.621

## 2014-11-11 HISTORY — DX: Non-pressure chronic ulcer of other part of unspecified foot with unspecified severity: L97.509

## 2014-11-11 LAB — CBC WITH DIFFERENTIAL/PLATELET
BASOS PCT: 0 % (ref 0–1)
Basophils Absolute: 0 10*3/uL (ref 0.0–0.1)
EOS ABS: 0.2 10*3/uL (ref 0.0–0.7)
Eosinophils Relative: 2 % (ref 0–5)
HCT: 42.8 % (ref 39.0–52.0)
Hemoglobin: 14.7 g/dL (ref 13.0–17.0)
LYMPHS ABS: 3.6 10*3/uL (ref 0.7–4.0)
Lymphocytes Relative: 33 % (ref 12–46)
MCH: 29.8 pg (ref 26.0–34.0)
MCHC: 34.3 g/dL (ref 30.0–36.0)
MCV: 86.8 fL (ref 78.0–100.0)
MONOS PCT: 5 % (ref 3–12)
Monocytes Absolute: 0.6 10*3/uL (ref 0.1–1.0)
NEUTROS ABS: 6.5 10*3/uL (ref 1.7–7.7)
Neutrophils Relative %: 60 % (ref 43–77)
PLATELETS: 295 10*3/uL (ref 150–400)
RBC: 4.93 MIL/uL (ref 4.22–5.81)
RDW: 13.4 % (ref 11.5–15.5)
WBC: 11 10*3/uL — AB (ref 4.0–10.5)

## 2014-11-11 LAB — BASIC METABOLIC PANEL
ANION GAP: 8 (ref 5–15)
BUN: 12 mg/dL (ref 6–23)
CHLORIDE: 100 mmol/L (ref 96–112)
CO2: 29 mmol/L (ref 19–32)
CREATININE: 0.9 mg/dL (ref 0.50–1.35)
Calcium: 9.2 mg/dL (ref 8.4–10.5)
Glucose, Bld: 186 mg/dL — ABNORMAL HIGH (ref 70–99)
POTASSIUM: 5 mmol/L (ref 3.5–5.1)
Sodium: 137 mmol/L (ref 135–145)

## 2014-11-11 MED ORDER — SODIUM CHLORIDE 0.9 % IV SOLN
INTRAVENOUS | Status: DC
  Administered 2014-11-11: 22:00:00 via INTRAVENOUS

## 2014-11-11 MED ORDER — PIPERACILLIN-TAZOBACTAM 3.375 G IVPB
3.3750 g | Freq: Once | INTRAVENOUS | Status: AC
Start: 2014-11-11 — End: 2014-11-12
  Administered 2014-11-12: 3.375 g via INTRAVENOUS
  Filled 2014-11-11: qty 50

## 2014-11-11 MED ORDER — MORPHINE SULFATE 4 MG/ML IJ SOLN
4.0000 mg | Freq: Once | INTRAMUSCULAR | Status: AC
Start: 2014-11-12 — End: 2014-11-12
  Administered 2014-11-12: 4 mg via INTRAVENOUS
  Filled 2014-11-11: qty 1

## 2014-11-11 MED ORDER — MORPHINE SULFATE 4 MG/ML IJ SOLN
4.0000 mg | Freq: Once | INTRAMUSCULAR | Status: AC
  Administered 2014-11-11: 4 mg via INTRAVENOUS
  Filled 2014-11-11: qty 1

## 2014-11-11 NOTE — ED Provider Notes (Signed)
CSN: [id number]     Arrival date & time 11/11/14  2013 History   First MD Initiated Contact with Patient 11/11/14 2100     Chief Complaint  Patient presents with  . Numbness    R leg and arm  . Tingling    HPI Patient presents to the emergency room with complaints of pain in his right lower leg. He also complains of numbness and tingling in the right arm and right leg. He's had symptoms ongoing for the last week. Symptoms have persisted. This morning when he woke up he was having tingling again in the right arm and right leg. As the day progressed he started experiencing increasing pain. He also feels like that side is weak. Patient has noticed a sore and ulcerations right lower leg. He has noticed some erythema around that site. The pain increases with palpation and movement. He denies any fevers or chills. No vomiting or diarrhea. No slurred speech Past Medical History  Diagnosis Date  . Diabetes mellitus   . 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 Appendectomy     Family History  Problem Relation Age of Onset  . Cancer Mother    History  Substance Use Topics  . Smoking status: Current Every Day Smoker -- 1.00 packs/day for 30 years    Types: Cigarettes  . Smokeless tobacco: Not on file  . Alcohol Use: No     Comment: occassionally    Review of Systems  All other systems reviewed and are negative.     Allergies  Review of patient's allergies indicates no known allergies.  Home Medications   Prior to Admission medications   Medication Sig Start Date End Date Taking? Authorizing Provider  acetaminophen (TYLENOL) 325 MG tablet Take 2 tablets (650 mg total) by mouth every 6 (six) hours as needed for mild pain. 06/01/14  Yes Delfina Redwood, MD  metFORMIN (GLUCOPHAGE) 500 MG tablet Take 1 tablet (500 mg total) by mouth 2 (two) times daily with a meal. For 2 weeks, then 2 tablets twice daily Patient  taking differently: Take 1,000 mg by mouth 2 (two) times daily with a meal.  06/01/14  Yes Delfina Redwood, MD  aspirin EC 81 MG tablet Take 1 tablet (81 mg total) by mouth daily. 06/01/14   Delfina Redwood, MD  HYDROcodone-acetaminophen (NORCO/VICODIN) 5-325 MG per tablet Take 1 tablet by mouth every 6 (six) hours as needed for severe pain. 06/01/14   Delfina Redwood, MD  HYDROcodone-acetaminophen (NORCO/VICODIN) 5-325 MG per tablet Take 1-2 tablets by mouth every 6 (six) hours as needed for moderate pain or severe pain. 07/21/14   Blanchie Dessert, MD  pravastatin (PRAVACHOL) 20 MG tablet Take 1 tablet (20 mg total) by mouth daily. 06/01/14   Delfina Redwood, MD   BP 128/71 mmHg  Pulse 77  Temp(Src) 98.3 F (36.8 C) (Oral)  Resp 18  SpO2 94% Physical Exam  Constitutional: He appears well-developed and well-nourished. No distress.  HENT:  Head: Normocephalic and atraumatic.  Right Ear: External ear normal.  Left Ear: External ear normal.  Eyes: Conjunctivae are normal. Right eye exhibits no discharge. Left eye exhibits no discharge. No scleral icterus.  Neck: Neck supple. No tracheal deviation present.  Cardiovascular: Normal rate, regular rhythm and intact distal pulses.   Pulmonary/Chest: Effort normal and breath sounds normal. No stridor. No respiratory distress. He has no wheezes.  He has no rales.  Abdominal: Soft. Bowel sounds are normal. He exhibits no distension. There is no tenderness. There is no rebound and no guarding.  Musculoskeletal: He exhibits no edema or tenderness.       Legs: ttp right anterior shin surrounding a superficial scab, erythema with ttp, no lymphangitic streaking, no fluctuance  Neurological: He is alert. No cranial nerve deficit (no facial droop, extraocular movements intact, no slurred speech) or sensory deficit. He exhibits normal muscle tone. He displays no seizure activity. Coordination normal.  Decreased grip strength and plantar flexion  strength on right side, subjective decrease sensation on the right arm and right leg, normal speech  Skin: Skin is warm and dry. No rash noted.  Psychiatric: He has a normal mood and affect.  Nursing note and vitals reviewed.   ED Course  Procedures (including critical care time) Labs Review Labs Reviewed  CBC WITH DIFFERENTIAL/PLATELET - Abnormal; Notable for the following:    WBC 11.0 (*)    All other components within normal limits  BASIC METABOLIC PANEL - Abnormal; Notable for the following:    Glucose, Bld 186 (*)    All other components within normal limits  D-DIMER, QUANTITATIVE    Imaging Review Dg Tibia/fibula Right  11/11/2014   CLINICAL DATA:  Pedicles pleural knee mid-lower right leg.  EXAM: RIGHT TIBIA AND FIBULA - 2 VIEW  COMPARISON:  None.  FINDINGS: There is no evidence of fracture or other focal bone lesions. 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 There is no soft tissue emphysema. There is no radiopaque foreign body. There are dystrophic calcifications within the medial soft tissues of the right lower leg.  IMPRESSION: No acute osseous abnormality of the right tibia or fibula.   Electronically Signed   By: Kathreen Devoid   On: 11/11/2014 21:53   Ct Head Wo Contrast  11/11/2014   CLINICAL DATA:  Pt from work, pt c/o numbness and tingling in R arm and leg. Pt noticed the tingling at this morning and then couldn't walk on his R leg by 5 pm.  EXAM: CT HEAD WITHOUT CONTRAST  TECHNIQUE: Contiguous axial images were obtained from the base of the skull through the vertex without intravenous contrast.  COMPARISON:  None.  FINDINGS: Ventricles are normal in size and configuration. There are no parenchymal masses or mass effect. No evidence of an infarct. There are no extra-axial masses or abnormal fluid collections.  There is no intracranial hemorrhage.  Mild polypoid mucosal thickening noted in the maxillary sinuses. Remaining sinuses are clear as are the mastoid air  cells. No skull lesion.  IMPRESSION: 1. No intracranial abnormality. 2. Mild maxillary sinus polypoid mucosal thickening.   Electronically Signed   By: Lajean Manes M.D.   On: 11/11/2014 22:20    Medications  0.9 %  sodium chloride infusion ( Intravenous New Bag/Given 11/11/14 2145)  piperacillin-tazobactam (ZOSYN) IVPB 3.375 g (not administered)  morphine 4 MG/ML injection 4 mg (4 mg Intravenous Given 11/11/14 2145)     MDM   Final diagnoses:  Cellulitis of lower extremity, unspecified laterality    Complains of numbness and weakness right side.  CT scan dose not demonstrate a stroke.  Complains of ttp right leg.  Does have an ulcer.  Possible cellulitis.   D dimer sent as well for DVT screen.  Will admit, IV abx.     Dorie Rank, MD 11/11/14 [phone]

## 2014-11-11 NOTE — ED Notes (Signed)
Per EMS, Pt from work, pt c/o numbness and tingling in R arm and leg. Pt noticed the tingling at this morning and then couldn't walk on his R leg by 5 pm.  Once patient was on ambulance pt c/o the same in R arm. A&Ox4.

## 2014-11-11 NOTE — ED Notes (Signed)
Bed: [facility] Expected date:  Expected time:  Means of arrival:  Comments: EMS 49yo M numbness to leg, DM, hx of DVT

## 2014-11-12 ENCOUNTER — Emergency Department (HOSPITAL_COMMUNITY): Payer: Self-pay

## 2014-11-12 ENCOUNTER — Encounter (HOSPITAL_COMMUNITY): Payer: Self-pay | Admitting: Internal Medicine

## 2014-11-12 DIAGNOSIS — M79641 Pain in right hand: Secondary | ICD-10-CM

## 2014-11-12 DIAGNOSIS — M7989 Other specified soft tissue disorders: Secondary | ICD-10-CM

## 2014-11-12 DIAGNOSIS — L03115 Cellulitis of right lower limb: Secondary | ICD-10-CM

## 2014-11-12 DIAGNOSIS — R29898 Other symptoms and signs involving the musculoskeletal system: Secondary | ICD-10-CM | POA: Diagnosis present

## 2014-11-12 DIAGNOSIS — E1165 Type 2 diabetes mellitus with hyperglycemia: Secondary | ICD-10-CM

## 2014-11-12 HISTORY — DX: Cellulitis of right lower limb: L03.115

## 2014-11-12 LAB — COMPREHENSIVE METABOLIC PANEL
ALT: 22 U/L (ref 0–53)
ANION GAP: 7 (ref 5–15)
AST: 17 U/L (ref 0–37)
Albumin: 3.9 g/dL (ref 3.5–5.2)
Alkaline Phosphatase: 71 U/L (ref 39–117)
BILIRUBIN TOTAL: 0.6 mg/dL (ref 0.3–1.2)
BUN: 11 mg/dL (ref 6–23)
CALCIUM: 9 mg/dL (ref 8.4–10.5)
CO2: 32 mmol/L (ref 19–32)
CREATININE: 0.71 mg/dL (ref 0.50–1.35)
Chloride: 99 mmol/L (ref 96–112)
GLUCOSE: 203 mg/dL — AB (ref 70–99)
POTASSIUM: 3.7 mmol/L (ref 3.5–5.1)
Sodium: 138 mmol/L (ref 135–145)
Total Protein: 6.6 g/dL (ref 6.0–8.3)

## 2014-11-12 LAB — GLUCOSE, CAPILLARY
GLUCOSE-CAPILLARY: 166 mg/dL — AB (ref 70–99)
GLUCOSE-CAPILLARY: 262 mg/dL — AB (ref 70–99)
GLUCOSE-CAPILLARY: 243 mg/dL — AB (ref 70–99)
Glucose-Capillary: 187 mg/dL — ABNORMAL HIGH (ref 70–99)
Glucose-Capillary: 196 mg/dL — ABNORMAL HIGH (ref 70–99)

## 2014-11-12 LAB — HEPATIC FUNCTION PANEL
ALK PHOS: 72 U/L (ref 39–117)
ALT: 20 U/L (ref 0–53)
AST: 15 U/L (ref 0–37)
Albumin: 3.8 g/dL (ref 3.5–5.2)
Bilirubin, Direct: <0.1 mg/dL (ref 0.0–0.5)
Total Bilirubin: 0.4 mg/dL (ref 0.3–1.2)
Total Protein: 6.3 g/dL (ref 6.0–8.3)

## 2014-11-12 LAB — CBC
HCT: 42.1 % (ref 39.0–52.0)
Hemoglobin: 14 g/dL (ref 13.0–17.0)
MCH: 29.2 pg (ref 26.0–34.0)
MCHC: 33.3 g/dL (ref 30.0–36.0)
MCV: 87.7 fL (ref 78.0–100.0)
Platelets: 265 10*3/uL (ref 150–400)
RBC: 4.8 MIL/uL (ref 4.22–5.81)
RDW: 13.4 % (ref 11.5–15.5)
WBC: 9.1 10*3/uL (ref 4.0–10.5)

## 2014-11-12 LAB — CK: Total CK: 75 U/L (ref 7–232)

## 2014-11-12 LAB — D-DIMER, QUANTITATIVE: D-Dimer, Quant: <0.27 ug/mL-FEU (ref 0.00–0.48)

## 2014-11-12 LAB — SEDIMENTATION RATE: SED RATE: 9 mm/h (ref 0–16)

## 2014-11-12 LAB — MRSA PCR SCREENING: MRSA by PCR: NEGATIVE

## 2014-11-12 LAB — MAGNESIUM: Magnesium: 1.8 mg/dL (ref 1.5–2.5)

## 2014-11-12 LAB — PHOSPHORUS: Phosphorus: 4.3 mg/dL (ref 2.3–4.6)

## 2014-11-12 LAB — TSH: TSH: 4.538 u[IU]/mL — AB (ref 0.350–4.500)

## 2014-11-12 MED ORDER — DOXYCYCLINE HYCLATE 100 MG IV SOLR
100.0000 mg | Freq: Two times a day (BID) | INTRAVENOUS | Status: DC
  Administered 2014-11-12 – 2014-11-13 (×3): 100 mg via INTRAVENOUS
  Filled 2014-11-12 (×3): qty 100

## 2014-11-12 MED ORDER — DOCUSATE SODIUM 100 MG PO CAPS
100.0000 mg | ORAL_CAPSULE | Freq: Two times a day (BID) | ORAL | Status: DC
  Administered 2014-11-12 – 2014-11-13 (×3): 100 mg via ORAL
  Filled 2014-11-12 (×3): qty 1

## 2014-11-12 MED ORDER — LIVING WELL WITH DIABETES BOOK
Freq: Once | Status: AC
  Administered 2014-11-12: 16:00:00
  Filled 2014-11-12: qty 1

## 2014-11-12 MED ORDER — PRAVASTATIN SODIUM 20 MG PO TABS
20.0000 mg | ORAL_TABLET | Freq: Every day | ORAL | Status: DC
  Administered 2014-11-12 – 2014-11-13 (×2): 20 mg via ORAL
  Filled 2014-11-12 (×2): qty 1

## 2014-11-12 MED ORDER — HYDROCODONE-ACETAMINOPHEN 5-325 MG PO TABS: 1.0000 | ORAL_TABLET | Freq: Four times a day (QID) | ORAL | Status: DC | PRN

## 2014-11-12 MED ORDER — INSULIN ASPART 100 UNIT/ML ~~LOC~~ SOLN
0.0000 [IU] | Freq: Every day | SUBCUTANEOUS | Status: DC
  Administered 2014-11-12: 2 [IU] via SUBCUTANEOUS

## 2014-11-12 MED ORDER — ONDANSETRON HCL 4 MG/2ML IJ SOLN: 4.0000 mg | Freq: Four times a day (QID) | INTRAMUSCULAR | Status: DC | PRN

## 2014-11-12 MED ORDER — ACETAMINOPHEN 650 MG RE SUPP: 650.0000 mg | Freq: Four times a day (QID) | RECTAL | Status: DC | PRN

## 2014-11-12 MED ORDER — ENOXAPARIN SODIUM 40 MG/0.4ML ~~LOC~~ SOLN
40.0000 mg | SUBCUTANEOUS | Status: DC
  Administered 2014-11-12 – 2014-11-13 (×2): 40 mg via SUBCUTANEOUS
  Filled 2014-11-12 (×2): qty 0.4

## 2014-11-12 MED ORDER — DOXYCYCLINE HYCLATE 100 MG IV SOLR
100.0000 mg | Freq: Two times a day (BID) | INTRAVENOUS | Status: DC
  Filled 2014-11-12: qty 100

## 2014-11-12 MED ORDER — INSULIN ASPART 100 UNIT/ML ~~LOC~~ SOLN
0.0000 [IU] | Freq: Three times a day (TID) | SUBCUTANEOUS | Status: DC
  Administered 2014-11-12: 3 [IU] via SUBCUTANEOUS
  Administered 2014-11-12: 8 [IU] via SUBCUTANEOUS
  Administered 2014-11-12 – 2014-11-13 (×2): 3 [IU] via SUBCUTANEOUS

## 2014-11-12 MED ORDER — ONDANSETRON HCL 4 MG PO TABS: 4.0000 mg | ORAL_TABLET | Freq: Four times a day (QID) | ORAL | Status: DC | PRN

## 2014-11-12 MED ORDER — PIPERACILLIN-TAZOBACTAM 3.375 G IVPB
3.3750 g | Freq: Three times a day (TID) | INTRAVENOUS | Status: DC
  Administered 2014-11-12 – 2014-11-13 (×4): 3.375 g via INTRAVENOUS
  Filled 2014-11-12 (×5): qty 50

## 2014-11-12 MED ORDER — SODIUM CHLORIDE 0.9 % IJ SOLN
3.0000 mL | Freq: Two times a day (BID) | INTRAMUSCULAR | Status: DC
  Administered 2014-11-12: 3 mL via INTRAVENOUS

## 2014-11-12 MED ORDER — MORPHINE SULFATE 4 MG/ML IJ SOLN
4.0000 mg | INTRAMUSCULAR | Status: DC | PRN
  Administered 2014-11-12 (×4): 4 mg via INTRAVENOUS
  Filled 2014-11-12 (×4): qty 1

## 2014-11-12 MED ORDER — INFLUENZA VAC SPLIT QUAD 0.5 ML IM SUSY
0.5000 mL | PREFILLED_SYRINGE | INTRAMUSCULAR | Status: AC
  Administered 2014-11-13: 0.5 mL via INTRAMUSCULAR
  Filled 2014-11-12 (×2): qty 0.5

## 2014-11-12 MED ORDER — PNEUMOCOCCAL VAC POLYVALENT 25 MCG/0.5ML IJ INJ
0.5000 mL | INJECTION | INTRAMUSCULAR | Status: AC
  Administered 2014-11-13: 0.5 mL via INTRAMUSCULAR
  Filled 2014-11-12 (×2): qty 0.5

## 2014-11-12 MED ORDER — ACETAMINOPHEN 325 MG PO TABS: 650.0000 mg | ORAL_TABLET | Freq: Four times a day (QID) | ORAL | Status: DC | PRN

## 2014-11-12 MED ORDER — HYDROCODONE-ACETAMINOPHEN 5-325 MG PO TABS
1.0000 | ORAL_TABLET | ORAL | Status: DC | PRN
  Administered 2014-11-12 – 2014-11-13 (×3): 2 via ORAL
  Filled 2014-11-12 (×3): qty 2

## 2014-11-12 MED ORDER — NAPROXEN 500 MG PO TABS
500.0000 mg | ORAL_TABLET | Freq: Two times a day (BID) | ORAL | Status: DC
  Administered 2014-11-12 – 2014-11-13 (×2): 500 mg via ORAL
  Filled 2014-11-12 (×3): qty 1

## 2014-11-12 MED ORDER — ASPIRIN EC 81 MG PO TBEC
81.0000 mg | DELAYED_RELEASE_TABLET | Freq: Every day | ORAL | Status: DC
  Administered 2014-11-12 – 2014-11-13 (×2): 81 mg via ORAL
  Filled 2014-11-12 (×2): qty 1

## 2014-11-12 NOTE — Progress Notes (Signed)
VASCULAR LAB PRELIMINARY  PRELIMINARY  PRELIMINARY  PRELIMINARY  Right lower extremity venous duplex completed.    Preliminary report:  Right:  No evidence of DVT, superficial thrombosis, or Baker's cyst.  Nelli Swalley, RVS 11/12/2014, 8:56 AM

## 2014-11-12 NOTE — Progress Notes (Signed)
Inpatient Diabetes Program Recommendations  AACE/ADA: New Consensus Statement on Inpatient Glycemic Control (2013)  Target Ranges:  Prepandial:   less than 140 mg/dL      Peak postprandial:   less than 180 mg/dL (1-2 hours)      Critically ill patients:  140 - 180 mg/dL   Results for Cody Harper, Cody Harper (MRN [id number]) as of 11/12/2014 10:59  Ref. Range 11/12/2014 07:43  Glucose-Capillary Latest Range: 70-99 mg/dL 262 (H)    PMH of Diabetes mellitus and Varicose veins with ulcer and inflammation.   Admission for Right leg cellulitis and right arm weakness.   Patient does not havea PCP and have been running out his medications.   Home DM Meds: Metformin 1000 mg bid   Current Insulin Orders: Novolog Moderate SSI tid ac + HS (started this AM)   **Note current A1c pending.  **Note Novolog Moderate SSI started this AM.  Received 1st dose at 8am.  **Ordered Living Well with DM booklet for this patient for review of basic DM survival skills.    Will follow Wyn Quaker RN, MSN, CDE Diabetes Coordinator Inpatient Diabetes Program Team Pager: [phone] (8a-10p)

## 2014-11-12 NOTE — H&P (Signed)
PCP: No PCP Per Patient    Chief Complaint:  Right leg pain, right arm pain and weakness  HPI: Cody Harper is a 49 y.o. male   has a past medical history of Diabetes mellitus and Varicose veins with ulcer and inflammation.   Presented with  Patient has hx of diabetes. 2 weeks ago he has hit his right leg on something and since then it had a hard time healing. He reports chronic weakness in his right foot but it has been doing worse and he has been noticing some numbness as well. He have had some increased swelling in that leg as well. Patient works as a Astronomer. 3-4 days ago he noticed pain in his right arm around the elbow region when he was making a fist. He also started to notice some weakness in his right hand. He does a lot of repetitive motion with his right hand.  Hospitalist was called for admission for Right leg cellulitis and right arm weakness.  He does not have  A PCP and have been running out his medications. CT of the head no acute findings Review of Systems:    Pertinent positives include:  Right leg swelling, right arm weakness.   Constitutional:  No weight loss, night sweats, Fevers, chills, fatigue, weight loss  HEENT:  No headaches, Difficulty swallowing,Tooth/dental problems,Sore throat,  No sneezing, itching, ear ache, nasal congestion, post nasal drip,  Cardio-vascular:  No chest pain, Orthopnea, PND, anasarca, dizziness, palpitations.no Bilateral lower extremity swelling  GI:  No heartburn, indigestion, abdominal pain, nausea, vomiting, diarrhea, change in bowel habits, loss of appetite, melena, blood in stool, hematemesis Resp:  no shortness of breath at rest. No dyspnea on exertion, No excess mucus, no productive cough, No non-productive cough, No coughing up of blood.No change in color of mucus.No wheezing. Skin:  no rash or lesions. No jaundice GU:  no dysuria, change in color of urine, no urgency or frequency. No straining to urinate.  No  flank pain.  Musculoskeletal:  No joint pain or no joint swelling. No decreased range of motion. No back pain.  Psych:  No change in mood or affect. No depression or anxiety. No memory loss.  Neuro: no localizing neurological complaints, no tingling, no weakness, no double vision, no gait abnormality, no slurred speech, no confusion  Otherwise ROS are negative except for above, 10 systems were reviewed  Past Medical History: Past Medical History  Diagnosis Date  . Diabetes mellitus   . 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 Appendectomy       Medications: Prior to Admission medications   Medication Sig Start Date End Date Taking? Authorizing Provider  acetaminophen (TYLENOL) 325 MG tablet Take 2 tablets (650 mg total) by mouth every 6 (six) hours as needed for mild pain. 06/01/14  Yes Delfina Redwood, MD  metFORMIN (GLUCOPHAGE) 500 MG tablet Take 1 tablet (500 mg total) by mouth 2 (two) times daily with a meal. For 2 weeks, then 2 tablets twice daily Patient taking differently: Take 1,000 mg by mouth 2 (two) times daily with a meal.  06/01/14  Yes Delfina Redwood, MD  aspirin EC 81 MG tablet Take 1 tablet (81 mg total) by mouth daily. 06/01/14   Delfina Redwood, MD  HYDROcodone-acetaminophen (NORCO/VICODIN) 5-325 MG per tablet Take 1 tablet by mouth every 6 (six) hours as needed for severe pain. 06/01/14  Delfina Redwood, MD  HYDROcodone-acetaminophen (NORCO/VICODIN) 5-325 MG per tablet Take 1-2 tablets by mouth every 6 (six) hours as needed for moderate pain or severe pain. 07/21/14   Blanchie Dessert, MD  pravastatin (PRAVACHOL) 20 MG tablet Take 1 tablet (20 mg total) by mouth daily. 06/01/14   Delfina Redwood, MD    Allergies:  No Known Allergies  Social History:  Ambulatory   Independently  Lives at home   With family     reports that he has been smoking Cigarettes.  He has a 30 pack-year  smoking history. He does not have any smokeless tobacco history on file. He reports that he does not drink alcohol or use illicit drugs.    Family History: family history includes Cancer in his mother.    Physical Exam: Patient Vitals for the past 24 hrs:  BP Temp Temp src Pulse Resp SpO2  11/11/14 2359 120/72 mmHg - - 64 16 97 %  11/11/14 2024 128/71 mmHg 98.3 F (36.8 C) Oral 77 18 94 %    1. General:  in No Acute distress 2. Psychological: Alert and  Oriented 3. Head/ENT:   Moist  Mucous Membranes                          Head Non traumatic, neck supple                          Normal   Dentition 4. SKIN: normal  Skin turgor,  Skin clean Dry and intact no rash 5. Heart: Regular rate and rhythm no Murmur, Rub or gallop 6. Lungs: Clear to auscultation bilaterally, no wheezes or crackles   7. Abdomen: Soft, non-tender, Non distended 8. Lower extremities: no clubbing, cyanosis, right leg edema 9. Neurologically normal grips on the left. Right grip diminished due to pain. Lower extremity with increased pain to motion 10. MSK: right hand tenderness of the 5th metacarpall and wrist. Tenderness over right elbow. Pain with making a fist. Weakness associated with pain.   body mass index is unknown because there is no weight on file.   Labs on Admission:   Results for orders placed or performed during the hospital encounter of 11/11/14 (from the past 24 hour(s))  CBC with Differential     Status: Abnormal   Collection Time: 11/11/14  9:42 PM  Result Value Ref Range   WBC 11.0 (H) 4.0 - 10.5 K/uL   RBC 4.93 4.22 - 5.81 MIL/uL   Hemoglobin 14.7 13.0 - 17.0 g/dL   HCT 42.8 39.0 - 52.0 %   MCV 86.8 78.0 - 100.0 fL   MCH 29.8 26.0 - 34.0 pg   MCHC 34.3 30.0 - 36.0 g/dL   RDW 13.4 11.5 - 15.5 %   Platelets 295 150 - 400 K/uL   Neutrophils Relative % 60 43 - 77 %   Neutro Abs 6.5 1.7 - 7.7 K/uL   Lymphocytes Relative 33 12 - 46 %   Lymphs Abs 3.6 0.7 - 4.0 K/uL   Monocytes Relative  5 3 - 12 %   Monocytes Absolute 0.6 0.1 - 1.0 K/uL   Eosinophils Relative 2 0 - 5 %   Eosinophils Absolute 0.2 0.0 - 0.7 K/uL   Basophils Relative 0 0 - 1 %   Basophils Absolute 0.0 0.0 - 0.1 K/uL  Basic metabolic panel     Status: Abnormal   Collection Time: 11/11/14  9:42 PM  Result Value Ref Range   Sodium 137 135 - 145 mmol/L   Potassium 5.0 3.5 - 5.1 mmol/L   Chloride 100 96 - 112 mmol/L   CO2 29 19 - 32 mmol/L   Glucose, Bld 186 (H) 70 - 99 mg/dL   BUN 12 6 - 23 mg/dL   Creatinine, Ser 0.90 0.50 - 1.35 mg/dL   Calcium 9.2 8.4 - 10.5 mg/dL   GFR calc non Af Amer >90 >90 mL/min   GFR calc Af Amer >90 >90 mL/min   Anion gap 8 5 - 15    UA not obtained  Lab Results  Component Value Date   HGBA1C 10.5* 05/30/2014    CrCl cannot be calculated (Unknown ideal weight.).  BNP (last 3 results)  Recent Labs  05/30/14 0019 05/30/14 1846  PROBNP 13.6 20.1    Other results:  I have pearsonaly reviewed this: ECG not obtained  There were no vitals filed for this visit.   Cultures:    Component Value Date/Time   SDES URINE, RANDOM 05/30/2014 2225   SPECREQUEST NONE 05/30/2014 2225   CULT NO GROWTH Performed at [facility] 05/30/2014 2225   REPTSTATUS 06/01/2014 FINAL 05/30/2014 2225     Radiological Exams on Admission: Dg Tibia/fibula Right  11/11/2014   CLINICAL DATA:  Pedicles pleural knee mid-lower right leg.  EXAM: RIGHT TIBIA AND FIBULA - 2 VIEW  COMPARISON:  None.  FINDINGS: There is no evidence of fracture or other focal bone lesions. 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 There is no soft tissue emphysema. There is no radiopaque foreign body. There are dystrophic calcifications within the medial soft tissues of the right lower leg.  IMPRESSION: No acute osseous abnormality of the right tibia or fibula.   Electronically Signed   By: Kathreen Devoid   On: 11/11/2014 21:53   Ct Head Wo Contrast  11/11/2014   CLINICAL DATA:  Pt from work,  pt c/o numbness and tingling in R arm and leg. Pt noticed the tingling at this morning and then couldn't walk on his R leg by 5 pm.  EXAM: CT HEAD WITHOUT CONTRAST  TECHNIQUE: Contiguous axial images were obtained from the base of the skull through the vertex without intravenous contrast.  COMPARISON:  None.  FINDINGS: Ventricles are normal in size and configuration. There are no parenchymal masses or mass effect. No evidence of an infarct. There are no extra-axial masses or abnormal fluid collections.  There is no intracranial hemorrhage.  Mild polypoid mucosal thickening noted in the maxillary sinuses. Remaining sinuses are clear as are the mastoid air cells. No skull lesion.  IMPRESSION: 1. No intracranial abnormality. 2. Mild maxillary sinus polypoid mucosal thickening.   Electronically Signed   By: Lajean Manes M.D.   On: 11/11/2014 22:20    Chart has been reviewed  Assessment/Plan  49 yo M with hx of diabetes here with right leg swelling and redness worrisome for cellulitis and right hand tenderness which appears musculoskeletal with some diminished grip due to pain.   Present on Admission:  . Diabetes type 2, uncontrolled - sliding scale, diabetes education . Tobacco use disorder - spoke about importance of  quiting . Cellulitis of right leg -mild treat with doxycycline, obtain doplers . Right hand weakness - associated with intense pain in the elbow, wrist and 5th metacarpal, obtain plain imaging, sed rate, may benefit from orthopedic evaluation   Prophylaxis:  Lovenox, Protonix  CODE STATUS:  FULL CODE  Other plan as per orders.  I have spent a total of 55 min on this admission  Jaevion Goto 11/12/2014, 12:28 AM  [facility]  Pager [phone]   after 2 AM please page floor coverage PA If 7AM-7PM, please contact the day team taking care of the patient  [email or web address]  Password [email or web address]

## 2014-11-12 NOTE — Progress Notes (Signed)
Progress Note   Cody Harper ZOX:[id number] DOB: 1966/06/03 DOA: 11/11/2014 PCP: No PCP Per Patient   Brief Narrative:   Cody Harper is an 49 y.o. male with a PMH of diabetes, varicose veins, chronic weakness of right foot who was admitted 11/11/14 for evaluation of right arm weakness and treatment of right lower extremity cellulitis. CT of the head negative for acute findings.  Assessment/Plan:   Principal Problem:   Cellulitis of right leg/right leg pain  Continue doxycycline.  Right lower extremity Doppler negative.  Continue as needed pain medications for pain.  Active Problems:   Diabetes type 2, uncontrolled  Has not been compliant with medications secondary to running out and no current PCP. Was on metformin.  Currently being managed with moderate scale SSI. CBG 166.  Check hemoglobin A1c.    Tobacco use disorder  Counseled.    Right hand weakness / tendinitis  CT of head negative.  Plain films of the wrist, elbow, and hand show some degenerative changes in the hand but otherwise negative.  Exam consistent with tendinitis.  Will start NSAID.    DVT Prophylaxis  Lovenox ordered.  Code Status: Full. Family Communication: Wife at bedside. Disposition Plan: Home when stable.   IV Access:    Peripheral IV   Procedures and diagnostic studies:   Dg Elbow 2 Views Right  11/12/2014   CLINICAL DATA:  Right elbow pain for 1 week.  No known injury.  EXAM: RIGHT ELBOW - 2 VIEW  COMPARISON:  None.  FINDINGS: There is no evidence of fracture, dislocation, or joint effusion. There is no evidence of arthropathy or other focal bone abnormality. Soft tissues are unremarkable.  IMPRESSION: No acute osseous injury of the right elbow.   Electronically Signed   By: Kathreen Devoid   On: 11/12/2014 01:32   Dg Wrist Complete Right  11/12/2014   CLINICAL DATA:  Right wrist pain.  Pain for 1 week  EXAM: RIGHT WRIST - COMPLETE 3+ VIEW  COMPARISON:  None.   FINDINGS: There is no evidence of fracture or dislocation. There is no evidence of arthropathy or other focal bone abnormality. Soft tissues are unremarkable.  IMPRESSION: No acute osseous injury of the right wrist.   Electronically Signed   By: Kathreen Devoid   On: 11/12/2014 01:33   Dg Tibia/fibula Right  11/11/2014   CLINICAL DATA:  Pedicles pleural knee mid-lower right leg.  EXAM: RIGHT TIBIA AND FIBULA - 2 VIEW  COMPARISON:  None.  FINDINGS: There is no evidence of fracture or other focal bone lesions. 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 There is no soft tissue emphysema. There is no radiopaque foreign body. There are dystrophic calcifications within the medial soft tissues of the right lower leg.  IMPRESSION: No acute osseous abnormality of the right tibia or fibula.   Electronically Signed   By: Kathreen Devoid   On: 11/11/2014 21:53   Ct Head Wo Contrast  11/11/2014   CLINICAL DATA:  Pt from work, pt c/o numbness and tingling in R arm and leg. Pt noticed the tingling at this morning and then couldn't walk on his R leg by 5 pm.  EXAM: CT HEAD WITHOUT CONTRAST  TECHNIQUE: Contiguous axial images were obtained from the base of the skull through the vertex without intravenous contrast.  COMPARISON:  None.  FINDINGS: Ventricles are normal in size and configuration. There are no parenchymal masses or mass effect. No evidence of an infarct. There  are no extra-axial masses or abnormal fluid collections.  There is no intracranial hemorrhage.  Mild polypoid mucosal thickening noted in the maxillary sinuses. Remaining sinuses are clear as are the mastoid air cells. No skull lesion.  IMPRESSION: 1. No intracranial abnormality. 2. Mild maxillary sinus polypoid mucosal thickening.   Electronically Signed   By: Lajean Manes M.D.   On: 11/11/2014 22:20   Dg Hand Complete Right  11/12/2014   CLINICAL DATA:  Right ulnar-sided hand wrist and elbow pain for 1 week. No known injury.  EXAM: RIGHT HAND -  COMPLETE 3+ VIEW  COMPARISON:  None.  FINDINGS: Degenerative changes in the interphalangeal and metacarpal phalangeal joints. No evidence of acute fracture or dislocation in the right hand. No focal bone lesion or bone destruction.  IMPRESSION: Degenerative changes in the right hand. No acute bony abnormalities.   Electronically Signed   By: Lucienne Capers M.D.   On: 11/12/2014 01:33     Medical Consultants:    None.  Anti-Infectives:    Doxycycline 11/11/14--->  Subjective:    Cody Harper has pain along the right elbow, flexor tendons.  He also has some numbness in the right leg.  Has a history of skin boils.  Has a history of MRSA.    Objective:    Filed Vitals:   11/11/14 2024 11/11/14 2359 11/12/14 0244 11/12/14 0504  BP: 128/71 120/72 143/91 120/82  Pulse: 77 64 68 58  Temp: 98.3 F (36.8 C)  97.7 F (36.5 C) 97.5 F (36.4 C)  TempSrc: Oral  Oral Oral  Resp: 18 16 18 18   Height:   6\' 4"  (1.93 m)   Weight:   136.805 kg (301 lb 9.6 oz)   SpO2: 94% 97% 100% 100%    Intake/Output Summary (Last 24 hours) at 11/12/14 0825 Last data filed at 11/12/14 0500  Gross per 24 hour  Intake     50 ml  Output    825 ml  Net   -775 ml    Exam: Gen:  NAD Cardiovascular:  RRR, No M/R/G Respiratory:  Lungs CTAB Gastrointestinal:  Abdomen soft, NT/ND, + BS Extremities:  As pictured below:      Data Reviewed:    Labs: Basic Metabolic Panel:  Recent Labs Lab 11/11/14 2142 11/12/14 0620  NA 137 138  K 5.0 3.7  CL 100 99  CO2 29 32  GLUCOSE 186* 203*  BUN 12 11  CREATININE 0.90 0.71  CALCIUM 9.2 9.0  MG  --  1.8  PHOS  --  4.3   GFR Estimated Creatinine Clearance: 168.7 mL/min (by C-G formula based on Cr of 0.71). Liver Function Tests:  Recent Labs Lab 11/12/14 0038 11/12/14 0620  AST 15 17  ALT 20 22  ALKPHOS 72 71  BILITOT 0.4 0.6  PROT 6.3 6.6  ALBUMIN 3.8 3.9   CBC:  Recent Labs Lab 11/11/14 2142 11/12/14 0620  WBC 11.0* 9.1    NEUTROABS 6.5  --   HGB 14.7 14.0  HCT 42.8 42.1  MCV 86.8 87.7  PLT 295 265   Cardiac Enzymes:  Recent Labs Lab 11/12/14 0038  CKTOTAL 75   BNP (last 3 results)  Recent Labs  05/30/14 0019 05/30/14 1846  PROBNP 13.6 20.1   CBG:  Recent Labs Lab 11/12/14 0214  GLUCAP 166*   D-Dimer:  Recent Labs  11/11/14 2349  DDIMER <0.27   Hgb A1c: No results for input(s): HGBA1C in the last 72 hours.  Microbiology  Recent Results (from the past 240 hour(s))  MRSA PCR Screening     Status: None   Collection Time: 11/12/14  2:38 AM  Result Value Ref Range Status   MRSA by PCR NEGATIVE NEGATIVE Final    Comment:        The GeneXpert MRSA Assay (FDA approved for NASAL specimens only), is one component of a comprehensive MRSA colonization surveillance program. It is not intended to diagnose MRSA infection nor to guide or monitor treatment for MRSA infections.      Medications:   . aspirin EC  81 mg Oral Daily  . docusate sodium  100 mg Oral BID  . doxycycline (VIBRAMYCIN) IV  100 mg Intravenous Q12H  . enoxaparin (LOVENOX) injection  40 mg Subcutaneous Q24H  . [START ON 11/13/2014] Influenza vac split quadrivalent PF  0.5 mL Intramuscular Tomorrow-1000  . insulin aspart  0-15 Units Subcutaneous TID WC  . insulin aspart  0-5 Units Subcutaneous QHS  . piperacillin-tazobactam (ZOSYN)  IV  3.375 g Intravenous Q8H  . [START ON 11/13/2014] pneumococcal 23 valent vaccine  0.5 mL Intramuscular Tomorrow-1000  . pravastatin  20 mg Oral Daily  . sodium chloride  3 mL Intravenous Q12H   Continuous Infusions:   Time spent: 25 minutes.   LOS: 1 day   RAMA,CHRISTINA  [facility] Pager [phone]. If unable to reach me by pager, please call my cell phone at [phone].  *Please refer to [email or web address], password [email or web address] to get updated schedule on who will round on this patient, as hospitalists switch teams weekly. If 7PM-7AM, please contact night-coverage at [email or web address],  password [email or web address] for any overnight needs.  11/12/2014, 8:25 AM

## 2014-11-13 ENCOUNTER — Encounter (HOSPITAL_COMMUNITY): Payer: Self-pay | Admitting: Internal Medicine

## 2014-11-13 DIAGNOSIS — M778 Other enthesopathies, not elsewhere classified: Secondary | ICD-10-CM | POA: Diagnosis present

## 2014-11-13 DIAGNOSIS — R7989 Other specified abnormal findings of blood chemistry: Secondary | ICD-10-CM | POA: Diagnosis present

## 2014-11-13 DIAGNOSIS — M79604 Pain in right leg: Secondary | ICD-10-CM

## 2014-11-13 DIAGNOSIS — Z720 Tobacco use: Secondary | ICD-10-CM

## 2014-11-13 DIAGNOSIS — IMO0002 Reserved for concepts with insufficient information to code with codable children: Secondary | ICD-10-CM

## 2014-11-13 DIAGNOSIS — M6289 Other specified disorders of muscle: Secondary | ICD-10-CM

## 2014-11-13 HISTORY — DX: Other specified abnormal findings of blood chemistry: R79.89

## 2014-11-13 HISTORY — DX: Reserved for concepts with insufficient information to code with codable children: IMO0002

## 2014-11-13 HISTORY — DX: Other enthesopathies, not elsewhere classified: M77.8

## 2014-11-13 LAB — GLUCOSE, CAPILLARY
GLUCOSE-CAPILLARY: 208 mg/dL — AB (ref 70–99)
Glucose-Capillary: 196 mg/dL — ABNORMAL HIGH (ref 70–99)

## 2014-11-13 MED ORDER — IBUPROFEN 200 MG PO CAPS: 600.0000 mg | ORAL_CAPSULE | Freq: Four times a day (QID) | ORAL | Status: DC | PRN

## 2014-11-13 MED ORDER — INSULIN ASPART 100 UNIT/ML ~~LOC~~ SOLN: 4.0000 [IU] | Freq: Three times a day (TID) | SUBCUTANEOUS | Status: DC

## 2014-11-13 MED ORDER — DOXYCYCLINE HYCLATE 100 MG PO CAPS: 100.0000 mg | ORAL_CAPSULE | Freq: Two times a day (BID) | ORAL | Status: DC

## 2014-11-13 MED ORDER — INSULIN ASPART 100 UNIT/ML ~~LOC~~ SOLN: 0.0000 [IU] | Freq: Every day | SUBCUTANEOUS | Status: DC

## 2014-11-13 MED ORDER — INSULIN ASPART 100 UNIT/ML ~~LOC~~ SOLN: 0.0000 [IU] | Freq: Three times a day (TID) | SUBCUTANEOUS | Status: DC

## 2014-11-13 MED ORDER — METFORMIN HCL 1000 MG PO TABS: 1000.0000 mg | ORAL_TABLET | Freq: Two times a day (BID) | ORAL | Status: DC

## 2014-11-13 MED ORDER — INSULIN GLARGINE 100 UNIT/ML ~~LOC~~ SOLN
10.0000 [IU] | Freq: Every day | SUBCUTANEOUS | Status: DC
  Administered 2014-11-13: 10 [IU] via SUBCUTANEOUS
  Filled 2014-11-13: qty 0.1

## 2014-11-13 NOTE — Discharge Summary (Signed)
Physician Discharge Summary  APOLONIO CUTTING EGB:[id number] DOB: [birth year] DOA: 11/11/2014  PCP: No PCP Per Patient  Admit date: 11/11/2014 Discharge date: 11/13/2014   Recommendations for Outpatient Follow-Up:   1. Instructed to F/U with a PCP of his choice for treatment of his DM. Referred to [facility]. 2. TSH mildly elevated, will need outpatient free T4 studies and assessment for treatment of mild hypothyroidism.   Discharge Diagnosis:   Principal Problem:    Cellulitis of right leg Active Problems:    Diabetes type 2, uncontrolled    Right leg pain    Tobacco use disorder    Right hand weakness    Tendinitis right forearm flexors    Mildly elevated TSH  Discharge Condition: Improved.  Diet recommendation: Carbohydrate-modified.    History of Present Illness:   Cody Harper is an 49 y.o. male with a PMH of diabetes, varicose veins, chronic weakness of right foot who was admitted 11/11/14 for evaluation of right arm weakness and treatment of right lower extremity cellulitis. CT of the head negative for acute findings.   Hospital Course by Problem:   Principal Problem:  Cellulitis of right leg/right leg pain  Initially treated with doxycycline and Zosyn.  D/C home on doxycycline.  Right lower extremity Doppler negative.  Continue as needed pain medications for pain.  Active Problems:   Mildly elevated TSH  Recommend close outpatient F/U with free T4 assessment.   Diabetes type 2, uncontrolled  Has not been compliant with medications secondary to running out and no current PCP.   Hemoglobin A1c pending.  Counseled by DM coordinator. Needs close F/U with a PCP.  Patient referred to the [facility] and strongly advised to call Monday for a F/U appointment.   Tobacco use disorder  Counseled.   Right hand weakness / tendinitis  CT of head negative.  Plain films of the wrist, elbow, and hand show some degenerative changes in the hand but otherwise  negative.  Exam consistent with tendinitis. Continue NSAID.  Advises to use OTC Motrin 600 mg every 6 hours as needed.    Medical Consultants:    None.   Discharge Exam:   Filed Vitals:   11/13/14 0520  BP: 98/63  Pulse: 55  Temp: 97.7 F (36.5 C)  Resp: 20   Filed Vitals:   11/12/14 0941 11/12/14 1325 11/12/14 2032 11/13/14 0520  BP: 107/60 129/87 124/81 98/63  Pulse: 60 72 60 55  Temp: 98 F (36.7 C) 98 F (36.7 C) 98 F (36.7 C) 97.7 F (36.5 C)  TempSrc: Oral Oral Oral Oral  Resp: 16 18 20 20   Height:      Weight:      SpO2: 99% 98% 97% 98%    Gen:  NAD Cardiovascular:  RRR, No M/R/G Respiratory: Lungs CTAB Gastrointestinal: Abdomen soft, NT/ND with normal active bowel sounds. Extremities: Right lower leg as pictured below     The results of significant diagnostics from this hospitalization (including imaging, microbiology, ancillary and laboratory) are listed below for reference.     Procedures and Diagnostic Studies:   Dg Elbow 2 Views Right  11/12/2014   CLINICAL DATA:  Right elbow pain for 1 week.  No known injury.  EXAM: RIGHT ELBOW - 2 VIEW  COMPARISON:  None.  FINDINGS: There is no evidence of fracture, dislocation, or joint effusion. There is no evidence of arthropathy or other focal bone abnormality. Soft tissues are unremarkable.  IMPRESSION: No acute osseous injury of the right  elbow.   Electronically Signed   By: Kathreen Devoid   On: 11/12/2014 01:32   Dg Wrist Complete Right  11/12/2014   CLINICAL DATA:  Right wrist pain.  Pain for 1 week  EXAM: RIGHT WRIST - COMPLETE 3+ VIEW  COMPARISON:  None.  FINDINGS: There is no evidence of fracture or dislocation. There is no evidence of arthropathy or other focal bone abnormality. Soft tissues are unremarkable.  IMPRESSION: No acute osseous injury of the right wrist.   Electronically Signed   By: Kathreen Devoid   On: 11/12/2014 01:33   Dg Tibia/fibula Right  11/11/2014   CLINICAL DATA:  Pedicles pleural  knee mid-lower right leg.  EXAM: RIGHT TIBIA AND FIBULA - 2 VIEW  COMPARISON:  None.  FINDINGS: There is no evidence of fracture or other focal bone lesions. 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 There is no soft tissue emphysema. There is no radiopaque foreign body. There are dystrophic calcifications within the medial soft tissues of the right lower leg.  IMPRESSION: No acute osseous abnormality of the right tibia or fibula.   Electronically Signed   By: Kathreen Devoid   On: 11/11/2014 21:53   Ct Head Wo Contrast  11/11/2014   CLINICAL DATA:  Pt from work, pt c/o numbness and tingling in R arm and leg. Pt noticed the tingling at this morning and then couldn't walk on his R leg by 5 pm.  EXAM: CT HEAD WITHOUT CONTRAST  TECHNIQUE: Contiguous axial images were obtained from the base of the skull through the vertex without intravenous contrast.  COMPARISON:  None.  FINDINGS: Ventricles are normal in size and configuration. There are no parenchymal masses or mass effect. No evidence of an infarct. There are no extra-axial masses or abnormal fluid collections.  There is no intracranial hemorrhage.  Mild polypoid mucosal thickening noted in the maxillary sinuses. Remaining sinuses are clear as are the mastoid air cells. No skull lesion.  IMPRESSION: 1. No intracranial abnormality. 2. Mild maxillary sinus polypoid mucosal thickening.   Electronically Signed   By: Lajean Manes M.D.   On: 11/11/2014 22:20   Dg Hand Complete Right  11/12/2014   CLINICAL DATA:  Right ulnar-sided hand wrist and elbow pain for 1 week. No known injury.  EXAM: RIGHT HAND - COMPLETE 3+ VIEW  COMPARISON:  None.  FINDINGS: Degenerative changes in the interphalangeal and metacarpal phalangeal joints. No evidence of acute fracture or dislocation in the right hand. No focal bone lesion or bone destruction.  IMPRESSION: Degenerative changes in the right hand. No acute bony abnormalities.   Electronically Signed   By: Lucienne Capers M.D.   On: 11/12/2014 01:33    Labs:   Basic Metabolic Panel:  Recent Labs Lab 11/11/14 2142 11/12/14 0620  NA 137 138  K 5.0 3.7  CL 100 99  CO2 29 32  GLUCOSE 186* 203*  BUN 12 11  CREATININE 0.90 0.71  CALCIUM 9.2 9.0  MG  --  1.8  PHOS  --  4.3   GFR Estimated Creatinine Clearance: 168.7 mL/min (by C-G formula based on Cr of 0.71). Liver Function Tests:  Recent Labs Lab 11/12/14 0038 11/12/14 0620  AST 15 17  ALT 20 22  ALKPHOS 72 71  BILITOT 0.4 0.6  PROT 6.3 6.6  ALBUMIN 3.8 3.9   CBC:  Recent Labs Lab 11/11/14 2142 11/12/14 0620  WBC 11.0* 9.1  NEUTROABS 6.5  --   HGB 14.7 14.0  HCT 42.8 42.1  MCV 86.8 87.7  PLT 295 265   Cardiac Enzymes:  Recent Labs Lab 11/12/14 0038  CKTOTAL 75   CBG:  Recent Labs Lab 11/12/14 1250 11/12/14 1709 11/12/14 2100 11/13/14 0727 11/13/14 1148  GLUCAP 187* 196* 243* 196* 208*   D-Dimer  Recent Labs  11/11/14 2349  DDIMER <0.27   Thyroid function studies  Recent Labs  11/12/14 0620  TSH 4.538*   Microbiology Recent Results (from the past 240 hour(s))  MRSA PCR Screening     Status: None   Collection Time: 11/12/14  2:38 AM  Result Value Ref Range Status   MRSA by PCR NEGATIVE NEGATIVE Final    Comment:        The GeneXpert MRSA Assay (FDA approved for NASAL specimens only), is one component of a comprehensive MRSA colonization surveillance program. It is not intended to diagnose MRSA infection nor to guide or monitor treatment for MRSA infections.      Discharge Instructions:       Discharge Instructions    Call MD for:  severe uncontrolled pain    Complete by:  As directed      Call MD for:  temperature >100.4    Complete by:  As directed      Diet Carb Modified    Complete by:  As directed      Discharge instructions    Complete by:  As directed   You were cared for by Dr. Jacquelynn Cree  (a hospitalist) during your hospital stay. If you have any questions  about your discharge medications or the care you received while you were in the hospital after you are discharged, you can call the unit and ask to speak with the hospitalist on call if the hospitalist that took care of you is not available. Once you are discharged, your primary care physician will handle any further medical issues. Please note that NO REFILLS for any discharge medications will be authorized once you are discharged, as it is imperative that you return to your primary care physician (or establish a relationship with a primary care physician if you do not have one) for your aftercare needs so that they can reassess your need for medications and monitor your lab values.  Any outstanding tests can be reviewed by your PCP at your follow up visit.  It is also important to review any medicine changes with your PCP.  Please bring these d/c instructions with you to your next visit so your physician can review these changes with you.  If you do not have a primary care physician, you can call [phone] for a physician referral.  It is highly recommended that you obtain a PCP for hospital follow up.     Increase activity slowly    Complete by:  As directed      Other Restrictions    Complete by:  As directed   Please excuse from work through 11/20/14.  May return to work on 11/21/14.            Medication List    TAKE these medications        acetaminophen 325 MG tablet  Commonly known as:  TYLENOL  Take 2 tablets (650 mg total) by mouth every 6 (six) hours as needed for mild pain.     aspirin EC 81 MG tablet  Take 1 tablet (81 mg total) by mouth daily.     doxycycline 100 MG capsule  Commonly known as:  VIBRAMYCIN  Take 1 capsule (100 mg total) by mouth 2 (two) times daily.     HYDROcodone-acetaminophen 5-325 MG per tablet  Commonly known as:  NORCO/VICODIN  Take 1-2 tablets by mouth every 6 (six) hours as needed for moderate pain or severe pain.     Ibuprofen 200 MG Caps  Take 3  capsules (600 mg total) by mouth every 6 (six) hours as needed (Arthritis pain, right elbow pain.).     metFORMIN 1000 MG tablet  Commonly known as:  GLUCOPHAGE  Take 1 tablet (1,000 mg total) by mouth 2 (two) times daily with a meal.     pravastatin 20 MG tablet  Commonly known as:  PRAVACHOL  Take 1 tablet (20 mg total) by mouth daily.       Follow-up Information    Follow up with [facility]    . Schedule an appointment as soon as possible for a visit in 2 weeks.   Why:  Hospital follow up, management of diabetes.   Contact information:   [address] [place] [address] [phone]       Time coordinating discharge: 35 minutes.  Signed:  Berdia Lachman  Pager [phone] [facility] 11/13/2014, 1:14 PM

## 2014-11-13 NOTE — Discharge Instructions (Signed)
Cellulitis Cellulitis is an infection of the skin and the tissue beneath it. The infected area is usually red and tender. Cellulitis occurs most often in the arms and lower legs.  CAUSES  Cellulitis is caused by bacteria that enter the skin through cracks or cuts in the skin. The most common types of bacteria that cause cellulitis are staphylococci and streptococci. SIGNS AND SYMPTOMS   Redness and warmth.  Swelling.  Tenderness or pain.  Fever. DIAGNOSIS  Your health care provider can usually determine what is wrong based on a physical exam. Blood tests may also be done. TREATMENT  Treatment usually involves taking an antibiotic medicine. HOME CARE INSTRUCTIONS   Take your antibiotic medicine as directed by your health care provider. Finish the antibiotic even if you start to feel better.  Keep the infected arm or leg elevated to reduce swelling.  Apply a warm cloth to the affected area up to 4 times per day to relieve pain.  Take medicines only as directed by your health care provider.  Keep all follow-up visits as directed by your health care provider. SEEK MEDICAL CARE IF:   You notice red streaks coming from the infected area.  Your red area gets larger or turns dark in color.  Your bone or joint underneath the infected area becomes painful after the skin has healed.  Your infection returns in the same area or another area.  You notice a swollen bump in the infected area.  You develop new symptoms.  You have a fever. SEEK IMMEDIATE MEDICAL CARE IF:   You feel very sleepy.  You develop vomiting or diarrhea.  You have a general ill feeling (malaise) with muscle aches and pains. MAKE SURE YOU:   Understand these instructions.  Will watch your condition.  Will get help right away if you are not doing well or get worse. Document Released: 07/10/2005 Document Revised: 02/14/2014 Document Reviewed: 12/16/2011 [facility] Patient Information 2015 [place], [place].  This information is not intended to replace advice given to you by your health care provider. Make sure you discuss any questions you have with your health care provider.  Diabetes and Standards of Medical Care Diabetes is complicated. You may find that your diabetes team includes a dietitian, nurse, diabetes educator, eye doctor, and more. To help everyone know what is going on and to help you get the care you deserve, the following schedule of care was developed to help keep you on track. Below are the tests, exams, vaccines, medicines, education, and plans you will need. HbA1c test This test shows how well you have controlled your glucose over the past 2-3 months. It is used to see if your diabetes management plan needs to be adjusted.   It is performed at least 2 times a year if you are meeting treatment goals.  It is performed 4 times a year if therapy has changed or if you are not meeting treatment goals. Blood pressure test  This test is performed at every routine medical visit. The goal is less than 140/90 mm Hg for most people, but 130/80 mm Hg in some cases. Ask your health care provider about your goal. Dental exam  Follow up with the dentist regularly. Eye exam  If you are diagnosed with type 1 diabetes as a child, get an exam upon reaching the age of 68 years or older and have had diabetes for 3-5 years. Yearly eye exams are recommended after that initial eye exam.  If you are diagnosed with  type 1 diabetes as an adult, get an exam within 5 years of diagnosis and then yearly.  If you are diagnosed with type 2 diabetes, get an exam as soon as possible after the diagnosis and then yearly. Foot care exam  Visual foot exams are performed at every routine medical visit. The exams check for cuts, injuries, or other problems with the feet.  A comprehensive foot exam should be done yearly. This includes visual inspection as well as assessing foot pulses and testing for loss of  sensation.  Check your feet nightly for cuts, injuries, or other problems with your feet. Tell your health care provider if anything is not healing. Kidney function test (urine microalbumin)  This test is performed once a year.  Type 1 diabetes: The first test is performed 5 years after diagnosis.  Type 2 diabetes: The first test is performed at the time of diagnosis.  A serum creatinine and estimated glomerular filtration rate (eGFR) test is done once a year to assess the level of chronic kidney disease (CKD), if present. Lipid profile (cholesterol, HDL, LDL, triglycerides)  Performed every 5 years for most people.  The goal for LDL is less than 100 mg/dL. If you are at high risk, the goal is less than 70 mg/dL.  The goal for HDL is 40 mg/dL-50 mg/dL for men and 50 mg/dL-60 mg/dL for women. An HDL cholesterol of 60 mg/dL or higher gives some protection against heart disease.  The goal for triglycerides is less than 150 mg/dL. Influenza vaccine, pneumococcal vaccine, and hepatitis B vaccine  The influenza vaccine is recommended yearly.  It is recommended that people with diabetes who are over 80 years old get the pneumonia vaccine. In some cases, two separate shots may be given. Ask your health care provider if your pneumonia vaccination is up to date.  The hepatitis B vaccine is also recommended for adults with diabetes. Diabetes self-management education  Education is recommended at diagnosis and ongoing as needed. Treatment plan  Your treatment plan is reviewed at every medical visit. Document Released: 07/28/2009 Document Revised: 02/14/2014 Document Reviewed: 03/02/2013 [facility] Patient Information 2015 [place], [place]. This information is not intended to replace advice given to you by your health care provider. Make sure you discuss any questions you have with your health care provider.

## 2014-11-14 LAB — HEMOGLOBIN A1C
HEMOGLOBIN A1C: 11.8 % — AB (ref 4.8–5.6)
MEAN PLASMA GLUCOSE: 292 mg/dL

## 2014-11-25 ENCOUNTER — Ambulatory Visit: Payer: Self-pay | Admitting: Internal Medicine

## 2014-12-05 ENCOUNTER — Telehealth: Payer: Self-pay | Admitting: General Practice

## 2014-12-05 ENCOUNTER — Ambulatory Visit: Payer: Self-pay | Attending: Internal Medicine | Admitting: Internal Medicine

## 2014-12-05 ENCOUNTER — Encounter: Payer: Self-pay | Admitting: Internal Medicine

## 2014-12-05 ENCOUNTER — Telehealth: Payer: Self-pay

## 2014-12-05 VITALS — BP 130/80 | HR 76 | Temp 98.0°F | Resp 16 | Wt 313.4 lb

## 2014-12-05 DIAGNOSIS — R202 Paresthesia of skin: Secondary | ICD-10-CM | POA: Insufficient documentation

## 2014-12-05 DIAGNOSIS — R030 Elevated blood-pressure reading, without diagnosis of hypertension: Secondary | ICD-10-CM

## 2014-12-05 DIAGNOSIS — I8390 Asymptomatic varicose veins of unspecified lower extremity: Secondary | ICD-10-CM

## 2014-12-05 DIAGNOSIS — E1140 Type 2 diabetes mellitus with diabetic neuropathy, unspecified: Secondary | ICD-10-CM | POA: Insufficient documentation

## 2014-12-05 DIAGNOSIS — I83891 Varicose veins of right lower extremities with other complications: Secondary | ICD-10-CM | POA: Insufficient documentation

## 2014-12-05 DIAGNOSIS — E1165 Type 2 diabetes mellitus with hyperglycemia: Secondary | ICD-10-CM | POA: Insufficient documentation

## 2014-12-05 DIAGNOSIS — Z720 Tobacco use: Secondary | ICD-10-CM

## 2014-12-05 DIAGNOSIS — I868 Varicose veins of other specified sites: Secondary | ICD-10-CM

## 2014-12-05 DIAGNOSIS — R21 Rash and other nonspecific skin eruption: Secondary | ICD-10-CM

## 2014-12-05 DIAGNOSIS — R200 Anesthesia of skin: Secondary | ICD-10-CM

## 2014-12-05 DIAGNOSIS — IMO0001 Reserved for inherently not codable concepts without codable children: Secondary | ICD-10-CM

## 2014-12-05 DIAGNOSIS — E1149 Type 2 diabetes mellitus with other diabetic neurological complication: Secondary | ICD-10-CM

## 2014-12-05 DIAGNOSIS — H538 Other visual disturbances: Secondary | ICD-10-CM

## 2014-12-05 DIAGNOSIS — R739 Hyperglycemia, unspecified: Secondary | ICD-10-CM

## 2014-12-05 DIAGNOSIS — Z9114 Patient's other noncompliance with medication regimen: Secondary | ICD-10-CM | POA: Insufficient documentation

## 2014-12-05 DIAGNOSIS — R7989 Other specified abnormal findings of blood chemistry: Secondary | ICD-10-CM

## 2014-12-05 DIAGNOSIS — Z7982 Long term (current) use of aspirin: Secondary | ICD-10-CM | POA: Insufficient documentation

## 2014-12-05 DIAGNOSIS — R946 Abnormal results of thyroid function studies: Secondary | ICD-10-CM | POA: Insufficient documentation

## 2014-12-05 DIAGNOSIS — R7309 Other abnormal glucose: Secondary | ICD-10-CM

## 2014-12-05 DIAGNOSIS — L03115 Cellulitis of right lower limb: Secondary | ICD-10-CM

## 2014-12-05 DIAGNOSIS — E785 Hyperlipidemia, unspecified: Secondary | ICD-10-CM

## 2014-12-05 DIAGNOSIS — F17200 Nicotine dependence, unspecified, uncomplicated: Secondary | ICD-10-CM

## 2014-12-05 DIAGNOSIS — IMO0002 Reserved for concepts with insufficient information to code with codable children: Secondary | ICD-10-CM

## 2014-12-05 DIAGNOSIS — F17210 Nicotine dependence, cigarettes, uncomplicated: Secondary | ICD-10-CM | POA: Insufficient documentation

## 2014-12-05 LAB — COMPLETE METABOLIC PANEL WITH GFR
ALT: 19 U/L (ref 0–53)
AST: 12 U/L (ref 0–37)
Albumin: 4.2 g/dL (ref 3.5–5.2)
Alkaline Phosphatase: 72 U/L (ref 39–117)
BILIRUBIN TOTAL: 0.3 mg/dL (ref 0.2–1.2)
BUN: 10 mg/dL (ref 6–23)
CO2: 24 mEq/L (ref 19–32)
CREATININE: 0.69 mg/dL (ref 0.50–1.35)
Calcium: 9.6 mg/dL (ref 8.4–10.5)
Chloride: 99 mEq/L (ref 96–112)
GFR, Est African American: >89 mL/min
GFR, Est Non African American: >89 mL/min
Glucose, Bld: 221 mg/dL — ABNORMAL HIGH (ref 70–99)
Potassium: 4.4 mEq/L (ref 3.5–5.3)
Sodium: 136 mEq/L (ref 135–145)
Total Protein: 6.6 g/dL (ref 6.0–8.3)

## 2014-12-05 LAB — GLUCOSE, POCT (MANUAL RESULT ENTRY): POC GLUCOSE: 264 mg/dL — AB (ref 70–99)

## 2014-12-05 LAB — T4, FREE: FREE T4: 1.1 ng/dL (ref 0.80–1.80)

## 2014-12-05 LAB — VITAMIN B12: Vitamin B-12: 303 pg/mL (ref 211–911)

## 2014-12-05 LAB — T3, FREE: T3, Free: 2.7 pg/mL (ref 2.3–4.2)

## 2014-12-05 LAB — TSH: TSH: 1.647 u[IU]/mL (ref 0.350–4.500)

## 2014-12-05 MED ORDER — INSULIN GLARGINE 100 UNIT/ML SOLOSTAR PEN: 15.0000 [IU] | PEN_INJECTOR | Freq: Every day | SUBCUTANEOUS | Status: DC

## 2014-12-05 MED ORDER — CLOTRIMAZOLE-BETAMETHASONE 1-0.05 % EX CREA: 1.0000 "application " | TOPICAL_CREAM | Freq: Two times a day (BID) | CUTANEOUS | Status: DC

## 2014-12-05 MED ORDER — PRAVASTATIN SODIUM 20 MG PO TABS: 20.0000 mg | ORAL_TABLET | Freq: Every day | ORAL | Status: DC

## 2014-12-05 MED ORDER — GABAPENTIN 100 MG PO CAPS: 100.0000 mg | ORAL_CAPSULE | Freq: Three times a day (TID) | ORAL | Status: DC

## 2014-12-05 MED ORDER — INSULIN ASPART 100 UNIT/ML ~~LOC~~ SOLN
10.0000 [IU] | Freq: Once | SUBCUTANEOUS | Status: AC
  Administered 2014-12-05: 10 [IU] via SUBCUTANEOUS

## 2014-12-05 MED ORDER — BUPROPION HCL ER (SR) 150 MG PO TB12: 150.0000 mg | ORAL_TABLET | Freq: Two times a day (BID) | ORAL | Status: DC

## 2014-12-05 NOTE — Progress Notes (Signed)
Patient here to establish care Patient has a history of diabetes  Currently on ABT for wound to his lower right leg Patient complains of right leg swollen with some numbness from his ankle down Complains of having swollen lymph nodes in his groin area Patient also states recently diagnosed with tendonitis to his right arm

## 2014-12-05 NOTE — Patient Instructions (Signed)
Smoking Cessation Quitting smoking is important to your health and has many advantages. However, it is not always easy to quit since nicotine is a very addictive drug. Oftentimes, people try 3 times or more before being able to quit. This document explains the best ways for you to prepare to quit smoking. Quitting takes hard work and a lot of effort, but you can do it. ADVANTAGES OF QUITTING SMOKING  You will live longer, feel better, and live better.  Your body will feel the impact of quitting smoking almost immediately.  Within 20 minutes, blood pressure decreases. Your pulse returns to its normal level.  After 8 hours, carbon monoxide levels in the blood return to normal. Your oxygen level increases.  After 24 hours, the chance of having a heart attack starts to decrease. Your breath, hair, and body stop smelling like smoke.  After 48 hours, damaged nerve endings begin to recover. Your sense of taste and smell improve.  After 72 hours, the body is virtually free of nicotine. Your bronchial tubes relax and breathing becomes easier.  After 2 to 12 weeks, lungs can hold more air. Exercise becomes easier and circulation improves.  The risk of having a heart attack, stroke, cancer, or lung disease is greatly reduced.  After 1 year, the risk of coronary heart disease is cut in half.  After 5 years, the risk of stroke falls to the same as a nonsmoker.  After 10 years, the risk of lung cancer is cut in half and the risk of other cancers decreases significantly.  After 15 years, the risk of coronary heart disease drops, usually to the level of a nonsmoker.  If you are pregnant, quitting smoking will improve your chances of having a healthy baby.  The people you live with, especially any children, will be healthier.  You will have extra money to spend on things other than cigarettes. QUESTIONS TO THINK ABOUT BEFORE ATTEMPTING TO QUIT You may want to talk about your answers with your  health care provider.  Why do you want to quit?  If you tried to quit in the past, what helped and what did not?  What will be the most difficult situations for you after you quit? How will you plan to handle them?  Who can help you through the tough times? Your family? Friends? A health care provider?  What pleasures do you get from smoking? What ways can you still get pleasure if you quit? Here are some questions to ask your health care provider:  How can you help me to be successful at quitting?  What medicine do you think would be best for me and how should I take it?  What should I do if I need more help?  What is smoking withdrawal like? How can I get information on withdrawal? GET READY  Set a quit date.  Change your environment by getting rid of all cigarettes, ashtrays, matches, and lighters in your home, car, or work. Do not let people smoke in your home.  Review your past attempts to quit. Think about what worked and what did not. GET SUPPORT AND ENCOURAGEMENT You have a better chance of being successful if you have help. You can get support in many ways.  Tell your family, friends, and coworkers that you are going to quit and need their support. Ask them not to smoke around you.  Get individual, group, or telephone counseling and support. Programs are available at local hospitals and health centers. Call   your local health department for information about programs in your area.  Spiritual beliefs and practices may help some smokers quit.  Download a "quit meter" on your computer to keep track of quit statistics, such as how long you have gone without smoking, cigarettes not smoked, and money saved.  Get a self-help book about quitting smoking and staying off tobacco. LEARN NEW SKILLS AND BEHAVIORS  Distract yourself from urges to smoke. Talk to someone, go for a walk, or occupy your time with a task.  Change your normal routine. Take a different route to work.  Drink tea instead of coffee. Eat breakfast in a different place.  Reduce your stress. Take a hot bath, exercise, or read a book.  Plan something enjoyable to do every day. Reward yourself for not smoking.  Explore interactive web-based programs that specialize in helping you quit. GET MEDICINE AND USE IT CORRECTLY Medicines can help you stop smoking and decrease the urge to smoke. Combining medicine with the above behavioral methods and support can greatly increase your chances of successfully quitting smoking.  Nicotine replacement therapy helps deliver nicotine to your body without the negative effects and risks of smoking. Nicotine replacement therapy includes nicotine gum, lozenges, inhalers, nasal sprays, and skin patches. Some may be available over-the-counter and others require a prescription.  Antidepressant medicine helps people abstain from smoking, but how this works is unknown. This medicine is available by prescription.  Nicotinic receptor partial agonist medicine simulates the effect of nicotine in your brain. This medicine is available by prescription. Ask your health care provider for advice about which medicines to use and how to use them based on your health history. Your health care provider will tell you what side effects to look out for if you choose to be on a medicine or therapy. Carefully read the information on the package. Do not use any other product containing nicotine while using a nicotine replacement product.  RELAPSE OR DIFFICULT SITUATIONS Most relapses occur within the first 3 months after quitting. Do not be discouraged if you start smoking again. Remember, most people try several times before finally quitting. You may have symptoms of withdrawal because your body is used to nicotine. You may crave cigarettes, be irritable, feel very hungry, cough often, get headaches, or have difficulty concentrating. The withdrawal symptoms are only temporary. They are strongest  when you first quit, but they will go away within 10-14 days. To reduce the chances of relapse, try to:  Avoid drinking alcohol. Drinking lowers your chances of successfully quitting.  Reduce the amount of caffeine you consume. Once you quit smoking, the amount of caffeine in your body increases and can give you symptoms, such as a rapid heartbeat, sweating, and anxiety.  Avoid smokers because they can make you want to smoke.  Do not let weight gain distract you. Many smokers will gain weight when they quit, usually less than 10 pounds. Eat a healthy diet and stay active. You can always lose the weight gained after you quit.  Find ways to improve your mood other than smoking. FOR MORE INFORMATION  www.smokefree.gov  Document Released: 09/24/2001 Document Revised: 02/14/2014 Document Reviewed: 01/09/2012 [employer] Patient Information 2015 [employer]. This information is not intended to replace advice given to you by your health care provider. Make sure you discuss any questions you have with your health care provider. Diabetes Mellitus and Food It is important for you to manage your blood sugar (glucose) level. Your blood glucose level can be   greatly affected by what you eat. Eating healthier foods in the appropriate amounts throughout the day at about the same time each day will help you control your blood glucose level. It can also help slow or prevent worsening of your diabetes mellitus. Healthy eating may even help you improve the level of your blood pressure and reach or maintain a healthy weight.  HOW CAN FOOD AFFECT ME? Carbohydrates Carbohydrates affect your blood glucose level more than any other type of food. Your dietitian will help you determine how many carbohydrates to eat at each meal and teach you how to count carbohydrates. Counting carbohydrates is important to keep your blood glucose at a healthy level, especially if you are using insulin or taking certain medicines for  diabetes mellitus. Alcohol Alcohol can cause sudden decreases in blood glucose (hypoglycemia), especially if you use insulin or take certain medicines for diabetes mellitus. Hypoglycemia can be a life-threatening condition. Symptoms of hypoglycemia (sleepiness, dizziness, and disorientation) are similar to symptoms of having too much alcohol.  If your health care provider has given you approval to drink alcohol, do so in moderation and use the following guidelines:  Women should not have more than one drink per day, and men should not have more than two drinks per day. One drink is equal to:  12 oz of beer.  5 oz of wine.  1 oz of hard liquor.  Do not drink on an empty stomach.  Keep yourself hydrated. Have water, diet soda, or unsweetened iced tea.  Regular soda, juice, and other mixers might contain a lot of carbohydrates and should be counted. WHAT FOODS ARE NOT RECOMMENDED? As you make food choices, it is important to remember that all foods are not the same. Some foods have fewer nutrients per serving than other foods, even though they might have the same number of calories or carbohydrates. It is difficult to get your body what it needs when you eat foods with fewer nutrients. Examples of foods that you should avoid that are high in calories and carbohydrates but low in nutrients include:  Trans fats (most processed foods list trans fats on the Nutrition Facts label).  Regular soda.  Juice.  Candy.  Sweets, such as cake, pie, doughnuts, and cookies.  Fried foods. WHAT FOODS CAN I EAT? Have nutrient-rich foods, which will nourish your body and keep you healthy. The food you should eat also will depend on several factors, including:  The calories you need.  The medicines you take.  Your weight.  Your blood glucose level.  Your blood pressure level.  Your cholesterol level. You also should eat a variety of foods, including:  Protein, such as meat, poultry, fish,  tofu, nuts, and seeds (lean animal proteins are best).  Fruits.  Vegetables.  Dairy products, such as milk, cheese, and yogurt (low fat is best).  Breads, grains, pasta, cereal, rice, and beans.  Fats such as olive oil, trans fat-free margarine, canola oil, avocado, and olives. DOES EVERYONE WITH DIABETES MELLITUS HAVE THE SAME MEAL PLAN? Because every person with diabetes mellitus is different, there is not one meal plan that works for everyone. It is very important that you meet with a dietitian who will help you create a meal plan that is just right for you. Document Released: 06/27/2005 Document Revised: 10/05/2013 Document Reviewed: 08/27/2013 [employer] Patient Information 2015 [employer]. This information is not intended to replace advice given to you by your health care provider. Make sure you discuss any questions   you have with your health care provider. DASH Eating Plan DASH stands for "Dietary Approaches to Stop Hypertension." The DASH eating plan is a healthy eating plan that has been shown to reduce high blood pressure (hypertension). Additional health benefits may include reducing the risk of type 2 diabetes mellitus, heart disease, and stroke. The DASH eating plan may also help with weight loss. WHAT DO I NEED TO KNOW ABOUT THE DASH EATING PLAN? For the DASH eating plan, you will follow these general guidelines:  Choose foods with a percent daily value for sodium of less than 5% (as listed on the food label).  Use salt-free seasonings or herbs instead of table salt or sea salt.  Check with your health care provider or pharmacist before using salt substitutes.  Eat lower-sodium products, often labeled as "lower sodium" or "no salt added."  Eat fresh foods.  Eat more vegetables, fruits, and low-fat dairy products.  Choose whole grains. Look for the word "whole" as the first word in the ingredient list.  Choose fish and skinless chicken or [place] more often than red  meat. Limit fish, poultry, and meat to 6 oz (170 g) each day.  Limit sweets, desserts, sugars, and sugary drinks.  Choose heart-healthy fats.  Limit cheese to 1 oz (28 g) per day.  Eat more home-cooked food and less restaurant, buffet, and fast food.  Limit fried foods.  Cook foods using methods other than frying.  Limit canned vegetables. If you do use them, rinse them well to decrease the sodium.  When eating at a restaurant, ask that your food be prepared with less salt, or no salt if possible. WHAT FOODS CAN I EAT? Seek help from a dietitian for individual calorie needs. Grains Whole grain or whole wheat bread. Brown rice. Whole grain or whole wheat pasta. Quinoa, bulgur, and whole grain cereals. Low-sodium cereals. Corn or whole wheat flour tortillas. Whole grain cornbread. Whole grain crackers. Low-sodium crackers. Vegetables Fresh or frozen vegetables (raw, steamed, roasted, or grilled). Low-sodium or reduced-sodium tomato and vegetable juices. Low-sodium or reduced-sodium tomato sauce and paste. Low-sodium or reduced-sodium canned vegetables.  Fruits All fresh, canned (in natural juice), or frozen fruits. Meat and Other Protein Products Ground beef (85% or leaner), grass-fed beef, or beef trimmed of fat. Skinless chicken or [place]. Ground chicken or [place]. Pork trimmed of fat. All fish and seafood. Eggs. Dried beans, peas, or lentils. Unsalted nuts and seeds. Unsalted canned beans. Dairy Low-fat dairy products, such as skim or 1% milk, 2% or reduced-fat cheeses, low-fat ricotta or cottage cheese, or plain low-fat yogurt. Low-sodium or reduced-sodium cheeses. Fats and Oils Tub margarines without trans fats. Light or reduced-fat mayonnaise and salad dressings (reduced sodium). Avocado. Safflower, olive, or canola oils. Natural peanut or almond butter. Other Unsalted popcorn and pretzels. The items listed above may not be a complete list of recommended foods or beverages.  Contact your dietitian for more options. WHAT FOODS ARE NOT RECOMMENDED? Grains White bread. White pasta. White rice. Refined cornbread. Bagels and croissants. Crackers that contain trans fat. Vegetables Creamed or fried vegetables. Vegetables in a cheese sauce. Regular canned vegetables. Regular canned tomato sauce and paste. Regular tomato and vegetable juices. Fruits Dried fruits. Canned fruit in light or heavy syrup. Fruit juice. Meat and Other Protein Products Fatty cuts of meat. Ribs, chicken wings, bacon, sausage, bologna, salami, chitterlings, fatback, hot dogs, bratwurst, and packaged luncheon meats. Salted nuts and seeds. Canned beans with salt. Dairy Whole or 2% milk, cream, half-and-half,  and cream cheese. Whole-fat or sweetened yogurt. Full-fat cheeses or blue cheese. Nondairy creamers and whipped toppings. Processed cheese, cheese spreads, or cheese curds. Condiments Onion and garlic salt, seasoned salt, table salt, and sea salt. Canned and packaged gravies. Worcestershire sauce. Tartar sauce. Barbecue sauce. Teriyaki sauce. Soy sauce, including reduced sodium. Steak sauce. Fish sauce. Oyster sauce. Cocktail sauce. Horseradish. Ketchup and mustard. Meat flavorings and tenderizers. Bouillon cubes. Hot sauce. Tabasco sauce. Marinades. Taco seasonings. Relishes. Fats and Oils Butter, stick margarine, lard, shortening, ghee, and bacon fat. Coconut, palm kernel, or palm oils. Regular salad dressings. Other Pickles and olives. Salted popcorn and pretzels. The items listed above may not be a complete list of foods and beverages to avoid. Contact your dietitian for more information. WHERE CAN I FIND MORE INFORMATION? National Heart, [employer], and [employer] Institute: travelstabloid.com Document Released: 09/19/2011 Document Revised: 02/14/2014 Document Reviewed: 08/04/2013 [facility] Patient Information 2015 [place], [place]. This information is not intended to  replace advice given to you by your health care provider. Make sure you discuss any questions you have with your health care provider.

## 2014-12-05 NOTE — Progress Notes (Signed)
Patient Demographics  Cody Harper, is a 49 y.o. male  WFU:[id number]  KGU:[id number]  DOB - Jul 03, 1966  CC:  Chief Complaint  Patient presents with  . new patient       HPI: Cody Harper is a 49 y.o. male here today to establish medical care.Patient has history of diabetes, varicose veins chronic weakness of right foot, recently hospitalized for evaluation of arm weakness and right lower extremity cellulitis, EMR reviewed CT head was negative for any active findings, patient was treated for cellulitis and was discharged home with doxycycline and pain medication, his diabetes was also uncontrolled secondary to noncompliance with medication, also his right hand x-ray consistent with degenerative changes patient was also treated for tendinitis. As per patient he is taking medication doxycycline for cellulitis currently denies any fever chills also complaining of noticed rash in his groin which is itchy, as per patient he is taking metformin for diabetes and is fasting sugar is usually around 200 mg/dL, today also his blood sugar is elevated he is given insulin, patient complaining of numbness tingling in his feet also complaining of blurry vision has not seen ophthalmologist, he also smokes cigarettes, I have counseled patient to quit smoking, is willing to try Wellbutrin, he has tried nicotine patch and gum in the past but was not successful in quitting smoking. Also noted his blood work with abnormal TSH level, patient denies any family history of thyroid disease. Patient has No headache, No chest pain, No abdominal pain - No Nausea, No new weakness tingling or numbness, No Cough - SOB.  No Known Allergies Past Medical History  Diagnosis Date  . Diabetes mellitus   . Varicose veins with ulcer and inflammation     Right leg  . Diabetic foot ulcer 03/20/2011  . Diabetes type 2, uncontrolled 03/20/2011  . Vein, varicose 03/20/2011  . Syncope 05/30/2014  . Other and unspecified  hyperlipidemia 05/31/2014  . Obesity, unspecified 05/31/2014  . Tobacco use disorder 05/31/2014  . Superficial vein thrombosis 06/01/2014  . Cellulitis of right leg 11/12/2014  . Elevated TSH 11/13/2014  . Tendinitis of elbow or forearm 11/13/2014   Current Outpatient Prescriptions on File Prior to Visit  Medication Sig Dispense Refill  . doxycycline (VIBRAMYCIN) 100 MG capsule Take 1 capsule (100 mg total) by mouth 2 (two) times daily. 14 capsule 0  . metFORMIN (GLUCOPHAGE) 1000 MG tablet Take 1 tablet (1,000 mg total) by mouth 2 (two) times daily with a meal. 60 tablet 6  . acetaminophen (TYLENOL) 325 MG tablet Take 2 tablets (650 mg total) by mouth every 6 (six) hours as needed for mild pain.    Marland Kitchen aspirin EC 81 MG tablet Take 1 tablet (81 mg total) by mouth daily.    Marland Kitchen HYDROcodone-acetaminophen (NORCO/VICODIN) 5-325 MG per tablet Take 1-2 tablets by mouth every 6 (six) hours as needed for moderate pain or severe pain. 15 tablet 0  . Ibuprofen 200 MG CAPS Take 3 capsules (600 mg total) by mouth every 6 (six) hours as needed (Arthritis pain, right elbow pain.). 120 each 0   No current facility-administered medications on file prior to visit.   Family History  Problem Relation Age of Onset  . Cancer Mother     breast cancer   . Hypertension Mother   . Hypertension Father    History   Social History  . Marital Status: Married    Spouse Name: N/A  . Number of Children: N/A  . Years of Education:  N/A   Occupational History  . Not on file.   Social History Main Topics  . Smoking status: Current Every Day Smoker -- 1.00 packs/day for 30 years    Types: Cigarettes  . Smokeless tobacco: Not on file  . Alcohol Use: No     Comment: occassionally  . Drug Use: No  . Sexual Activity: Not on file     Comment: Die cast metal worker, operates trip   Other Topics Concern  . Not on file   Social History Narrative    Review of Systems: Constitutional: Negative for fever, chills,  diaphoresis, activity change, appetite change and fatigue. HENT: Negative for ear pain, nosebleeds, congestion, facial swelling, rhinorrhea, neck pain, neck stiffness and ear discharge.  Eyes: Negative for pain, discharge, redness, itching and visual disturbance. Respiratory: Negative for cough, choking, chest tightness, shortness of breath, wheezing and stridor.  Cardiovascular: Negative for chest pain, palpitations and leg swelling. Gastrointestinal: Negative for abdominal distention. Genitourinary: Negative for dysuria, urgency, frequency, hematuria, flank pain, decreased urine volume, difficulty urinating and dyspareunia.  Musculoskeletal: Negative for back pain, joint swelling, arthralgia and gait problem. Neurological: Negative for dizziness, tremors, seizures, syncope, facial asymmetry, speech difficulty, weakness, light-headedness, numbness and headaches.  Hematological: Negative for adenopathy. Does not bruise/bleed easily. Psychiatric/Behavioral: Negative for hallucinations, behavioral problems, confusion, dysphoric mood, decreased concentration and agitation.    Objective:   Filed Vitals:   12/05/14 1137  BP: 130/80  Pulse:   Temp:   Resp:     Physical Exam: Constitutional: Patient appears well-developed and well-nourished. No distress. HENT: Normocephalic, atraumatic, External right and left ear normal. Oropharynx is clear and moist.  Eyes: Conjunctivae and EOM are normal. PERRLA, no scleral icterus. Neck: Normal ROM. Neck supple. No JVD. No tracheal deviation. No thyromegaly. CVS: RRR, S1/S2 +, no murmurs, no gallops, no carotid bruit.  Pulmonary: Effort and breath sounds normal, no stridor, rhonchi, wheezes, rales.  Abdominal: Soft. BS +, no distension, tenderness, rebound or guarding.  Musculoskeletal: varicose veins prominent in right leg. Right leg 1+ edema/scab formation, no apparent discharge ( already been checked for DVT ultrasound has been negative) Bilateral  feet callus formation, impaired monofilament test.no ulcers. Neuro: Alert. Normal reflexes, muscle tone coordination. No cranial nerve deficit. Skin: Skin is warm and dry. +rash noted in groin. Not diaphoretic. No erythema. No pallor. Psychiatric: Normal mood and affect. Behavior, judgment, thought content normal.  Lab Results  Component Value Date   WBC 9.1 11/12/2014   HGB 14.0 11/12/2014   HCT 42.1 11/12/2014   MCV 87.7 11/12/2014   PLT 265 11/12/2014   Lab Results  Component Value Date   CREATININE 0.71 11/12/2014   BUN 11 11/12/2014   NA 138 11/12/2014   K 3.7 11/12/2014   CL 99 11/12/2014   CO2 32 11/12/2014    Lab Results  Component Value Date   HGBA1C 11.8* 11/12/2014   Lipid Panel     Component Value Date/Time   CHOL 232* 05/30/2014 0019   TRIG 336* 05/30/2014 0019   HDL 40 05/30/2014 0019   CHOLHDL 5.8 05/30/2014 0019   VLDL 67* 05/30/2014 0019   LDLCALC 125* 05/30/2014 0019       Assessment and plan:   1. Elevated blood sugar  - insulin aspart (novoLOG) injection 10 Units; Inject 0.1 mLs (10 Units total) into the skin once. - Glucose (CBG)  2. Diabetes type 2, uncontrolled Results for orders placed or performed in visit on 12/05/14  Glucose (CBG)  Result  Value Ref Range   POC Glucose 264 (A) 70 - 99 mg/dl   Last hemoglobin A1c was 11.8%, I have started patient on insulin Lantus 15 units each bedtime, continue with metformin, advise patient for diabetes meal planning, keep the fingerstick log, he will come back in 2 weeks for nurse visit CBG check. - COMPLETE METABOLIC PANEL WITH GFR - Insulin Glargine (LANTUS) 100 UNIT/ML Solostar Pen; Inject 15 Units into the skin daily at 10 pm.  Dispense: 15 mL; Refill: 3 - Ambulatory referral to Podiatry - Ambulatory referral to Ophthalmology  3. Tobacco use disorder  - buPROPion (WELLBUTRIN SR) 150 MG 12 hr tablet; Take 1 tablet (150 mg total) by mouth 2 (two) times daily.  Dispense: 60 tablet; Refill:  3  4. Cellulitis of right leg Currently patient is on doxycycline.  5. Elevated TSH Ordered  - TSH - T4, free - T3, free  6. Numbness and tingling  - Vit D  25 hydroxy (rtn osteoporosis monitoring) - Vitamin B12  7. Groin rash  - clotrimazole-betamethasone (LOTRISONE) cream; Apply 1 application topically 2 (two) times daily.  Dispense: 30 g; Refill: 1  8. Other diabetic neurological complication associated with type 2 diabetes mellitus  - gabapentin (NEURONTIN) 100 MG capsule; Take 1 capsule (100 mg total) by mouth 3 (three) times daily.  Dispense: 90 capsule; Refill: 3  9. Blurry vision  - Ambulatory referral to Ophthalmology  10. Elevated BP Repeat manual blood pressure 130/80, I have advised patient for DASH diet, reevaluate on the next visit if persistently elevated consider starting on low dose ACE inhibitor.  11. Vein, varicose/edema  Compression stocking/leg elevation, consider referral to vascular if there is worsening symptoms  12. Hyperlipidemia Patient is on Pravachol, will check fasting lipid panel on next visit.  - pravastatin (PRAVACHOL) 20 MG tablet; Take 1 tablet (20 mg total) by mouth daily.  Dispense: 30 tablet; Refill: 3        Health Maintenance  -Vaccinations:  Up-to-date with flu shot and Pneumovax  Return in about 3 months (around 03/05/2015) for diabetes, hyperipidemia, CBG check in 2 weeks/Nurse Visit.    The patient was given clear instructions to go to ER or return to medical center if symptoms don't improve, worsen or new problems develop. The patient verbalized understanding. The patient was told to call to get lab results if they haven't heard anything in the next week.    This note has been created with Surveyor, quantity. Any transcriptional errors are unintentional.   Lorayne Marek, MD

## 2014-12-05 NOTE — Telephone Encounter (Signed)
Returned patient phone call Patient was requesting a refill on hydrocodone Explained to the patient our office policy and he was ok with not getting the medication

## 2014-12-05 NOTE — Telephone Encounter (Signed)
Patient called stating that his medication HYDROcodone-acetaminophen was not sent into the pharmacy. Patient uses [facility] on [address]. Please f/u with pt.

## 2014-12-06 LAB — VITAMIN D 25 HYDROXY (VIT D DEFICIENCY, FRACTURES): VIT D 25 HYDROXY: 16 ng/mL — AB (ref 30–100)

## 2014-12-08 ENCOUNTER — Telehealth: Payer: Self-pay

## 2014-12-08 MED ORDER — VITAMIN D (ERGOCALCIFEROL) 1.25 MG (50000 UNIT) PO CAPS: 50000.0000 [IU] | ORAL_CAPSULE | ORAL | Status: DC

## 2014-12-08 NOTE — Telephone Encounter (Signed)
-----   Message from Lorayne Marek, MD sent at 12/06/2014 10:07 AM EST ----- Blood work reviewed, noticed low vitamin D, call patient advise to start ergocalciferol 50,000 units once a week for the duration of  12 weeks. Also the patient know that his TFT panel is normal.

## 2015-02-11 ENCOUNTER — Emergency Department (HOSPITAL_COMMUNITY)
Admission: EM | Admit: 2015-02-11 | Discharge: 2015-02-11 | Disposition: A | Discharge status: Home / Self Care | Payer: Self-pay | Attending: Emergency Medicine | Admitting: Emergency Medicine

## 2015-02-11 ENCOUNTER — Encounter (HOSPITAL_COMMUNITY): Payer: Self-pay | Admitting: *Deleted

## 2015-02-11 ENCOUNTER — Emergency Department (HOSPITAL_COMMUNITY): Payer: Self-pay

## 2015-02-11 DIAGNOSIS — Z794 Long term (current) use of insulin: Secondary | ICD-10-CM | POA: Insufficient documentation

## 2015-02-11 DIAGNOSIS — E785 Hyperlipidemia, unspecified: Secondary | ICD-10-CM | POA: Insufficient documentation

## 2015-02-11 DIAGNOSIS — Z8679 Personal history of other diseases of the circulatory system: Secondary | ICD-10-CM | POA: Insufficient documentation

## 2015-02-11 DIAGNOSIS — Z79899 Other long term (current) drug therapy: Secondary | ICD-10-CM | POA: Insufficient documentation

## 2015-02-11 DIAGNOSIS — E11621 Type 2 diabetes mellitus with foot ulcer: Secondary | ICD-10-CM | POA: Insufficient documentation

## 2015-02-11 DIAGNOSIS — B349 Viral infection, unspecified: Secondary | ICD-10-CM | POA: Insufficient documentation

## 2015-02-11 DIAGNOSIS — Z8739 Personal history of other diseases of the musculoskeletal system and connective tissue: Secondary | ICD-10-CM | POA: Insufficient documentation

## 2015-02-11 DIAGNOSIS — Z720 Tobacco use: Secondary | ICD-10-CM | POA: Insufficient documentation

## 2015-02-11 DIAGNOSIS — E1165 Type 2 diabetes mellitus with hyperglycemia: Secondary | ICD-10-CM | POA: Insufficient documentation

## 2015-02-11 DIAGNOSIS — Z86718 Personal history of other venous thrombosis and embolism: Secondary | ICD-10-CM | POA: Insufficient documentation

## 2015-02-11 DIAGNOSIS — R739 Hyperglycemia, unspecified: Secondary | ICD-10-CM

## 2015-02-11 DIAGNOSIS — L97509 Non-pressure chronic ulcer of other part of unspecified foot with unspecified severity: Secondary | ICD-10-CM | POA: Insufficient documentation

## 2015-02-11 DIAGNOSIS — E669 Obesity, unspecified: Secondary | ICD-10-CM | POA: Insufficient documentation

## 2015-02-11 DIAGNOSIS — Z792 Long term (current) use of antibiotics: Secondary | ICD-10-CM | POA: Insufficient documentation

## 2015-02-11 DIAGNOSIS — Z7982 Long term (current) use of aspirin: Secondary | ICD-10-CM | POA: Insufficient documentation

## 2015-02-11 LAB — URINALYSIS, ROUTINE W REFLEX MICROSCOPIC
Bilirubin Urine: NEGATIVE
GLUCOSE, UA: 500 mg/dL — AB
HGB URINE DIPSTICK: NEGATIVE
Ketones, ur: NEGATIVE mg/dL
LEUKOCYTES UA: NEGATIVE
NITRITE: NEGATIVE
PH: 6 (ref 5.0–8.0)
Protein, ur: NEGATIVE mg/dL
Specific Gravity, Urine: 1.029 (ref 1.005–1.030)
Urobilinogen, UA: 0.2 mg/dL (ref 0.0–1.0)

## 2015-02-11 LAB — CBG MONITORING, ED: Glucose-Capillary: 261 mg/dL — ABNORMAL HIGH (ref 70–99)

## 2015-02-11 LAB — COMPREHENSIVE METABOLIC PANEL
ALT: 28 U/L (ref 0–53)
ANION GAP: 12 (ref 5–15)
AST: 29 U/L (ref 0–37)
Albumin: 4.3 g/dL (ref 3.5–5.2)
Alkaline Phosphatase: 86 U/L (ref 39–117)
BUN: 16 mg/dL (ref 6–23)
CALCIUM: 10 mg/dL (ref 8.4–10.5)
CO2: 26 mmol/L (ref 19–32)
Chloride: 97 mmol/L (ref 96–112)
Creatinine, Ser: 0.9 mg/dL (ref 0.50–1.35)
GFR calc non Af Amer: >90 mL/min (ref >90)
GLUCOSE: 269 mg/dL — AB (ref 70–99)
Potassium: 4.4 mmol/L (ref 3.5–5.1)
Sodium: 135 mmol/L (ref 135–145)
Total Bilirubin: 1 mg/dL (ref 0.3–1.2)
Total Protein: 7.3 g/dL (ref 6.0–8.3)

## 2015-02-11 LAB — CBC
HCT: 47.4 % (ref 39.0–52.0)
Hemoglobin: 16.6 g/dL (ref 13.0–17.0)
MCH: 30.1 pg (ref 26.0–34.0)
MCHC: 35 g/dL (ref 30.0–36.0)
MCV: 86 fL (ref 78.0–100.0)
Platelets: 233 10*3/uL (ref 150–400)
RBC: 5.51 MIL/uL (ref 4.22–5.81)
RDW: 13.5 % (ref 11.5–15.5)
WBC: 10.2 10*3/uL (ref 4.0–10.5)

## 2015-02-11 LAB — I-STAT TROPONIN, ED: Troponin i, poc: 0 ng/mL (ref 0.00–0.08)

## 2015-02-11 MED ORDER — KETOROLAC TROMETHAMINE 30 MG/ML IJ SOLN
30.0000 mg | Freq: Once | INTRAMUSCULAR | Status: AC
  Administered 2015-02-11: 30 mg via INTRAVENOUS
  Filled 2015-02-11: qty 1

## 2015-02-11 MED ORDER — ONDANSETRON HCL 4 MG/2ML IJ SOLN
4.0000 mg | Freq: Once | INTRAMUSCULAR | Status: AC
  Administered 2015-02-11: 4 mg via INTRAVENOUS
  Filled 2015-02-11: qty 2

## 2015-02-11 MED ORDER — ONDANSETRON HCL 4 MG PO TABS: 4.0000 mg | ORAL_TABLET | Freq: Four times a day (QID) | ORAL | Status: DC

## 2015-02-11 MED ORDER — SODIUM CHLORIDE 0.9 % IV BOLUS (SEPSIS)
1000.0000 mL | Freq: Once | INTRAVENOUS | Status: AC
Start: 2015-02-11 — End: 2015-02-11
  Administered 2015-02-11: 1000 mL via INTRAVENOUS

## 2015-02-11 MED ORDER — SODIUM CHLORIDE 0.9 % IV BOLUS (SEPSIS)
1000.0000 mL | Freq: Once | INTRAVENOUS | Status: AC
  Administered 2015-02-11: 1000 mL via INTRAVENOUS

## 2015-02-11 NOTE — Discharge Instructions (Signed)
Take your Insulin as prescribed.

## 2015-02-11 NOTE — ED Provider Notes (Signed)
CSN: [id number]     Arrival date & time 02/11/15  1940 History   First MD Initiated Contact with Patient 02/11/15 1958     Chief Complaint  Patient presents with  . Generalized Body Aches     (Consider location/radiation/quality/duration/timing/severity/associated sxs/prior Treatment) HPI Comments: Patient presents today with multiple complaints.  He states that he has had SOB, cough, dizziness, fever, chills, body aches, chest pain with coughing, nausea, and diarrhea.   Symptom have been present for the past 2-3 days.  He has taken OTC cough drops for his symptoms without relief.  He also reports that his blood sugar has been high around 300.  He takes Lantus daily at bedtime.  He reports that his last dose of Lantus was last evening.  He denies vomiting, but reports that he has been dry heaving.  He denies abdominal pain or urinary symptoms.  No headache, neck pain/stiffness, vision changes, syncope, hematemesis, hematochezia, or melena.    The history is provided by the patient.    Past Medical History  Diagnosis Date  . Diabetes mellitus   . Varicose veins with ulcer and inflammation     Right leg  . Diabetic foot ulcer 03/20/2011  . Diabetes type 2, uncontrolled 03/20/2011  . Vein, varicose 03/20/2011  . Syncope 05/30/2014  . Other and unspecified hyperlipidemia 05/31/2014  . Obesity, unspecified 05/31/2014  . Tobacco use disorder 05/31/2014  . Superficial vein thrombosis 06/01/2014  . Cellulitis of right leg 11/12/2014  . Elevated TSH 11/13/2014  . Tendinitis of elbow or forearm 11/13/2014   Past Surgical History  Procedure Laterality Date  . Appendectomy     Family History  Problem Relation Age of Onset  . Cancer Mother     breast cancer   . Hypertension Mother   . Hypertension Father    History  Substance Use Topics  . Smoking status: Current Every Day Smoker -- 1.00 packs/day for 30 years    Types: Cigarettes  . Smokeless tobacco: Not on file  . Alcohol Use: No      Comment: occassionally    Review of Systems  All other systems reviewed and are negative.     Allergies  Review of patient's allergies indicates no known allergies.  Home Medications   Prior to Admission medications   Medication Sig Start Date End Date Taking? Authorizing Provider  acetaminophen (TYLENOL) 325 MG tablet Take 2 tablets (650 mg total) by mouth every 6 (six) hours as needed for mild pain. 06/01/14   Delfina Redwood, MD  aspirin EC 81 MG tablet Take 1 tablet (81 mg total) by mouth daily. 06/01/14   Delfina Redwood, MD  buPROPion (WELLBUTRIN SR) 150 MG 12 hr tablet Take 1 tablet (150 mg total) by mouth 2 (two) times daily. 12/05/14   Lorayne Marek, MD  clotrimazole-betamethasone (LOTRISONE) cream Apply 1 application topically 2 (two) times daily. 12/05/14   Lorayne Marek, MD  doxycycline (VIBRAMYCIN) 100 MG capsule Take 1 capsule (100 mg total) by mouth 2 (two) times daily. 11/13/14   Venetia Maxon Rama, MD  gabapentin (NEURONTIN) 100 MG capsule Take 1 capsule (100 mg total) by mouth 3 (three) times daily. 12/05/14   Lorayne Marek, MD  HYDROcodone-acetaminophen (NORCO/VICODIN) 5-325 MG per tablet Take 1-2 tablets by mouth every 6 (six) hours as needed for moderate pain or severe pain. 07/21/14   Blanchie Dessert, MD  Ibuprofen 200 MG CAPS Take 3 capsules (600 mg total) by mouth every 6 (six) hours as needed (  Arthritis pain, right elbow pain.). 11/13/14   Venetia Maxon Rama, MD  Insulin Glargine (LANTUS) 100 UNIT/ML Solostar Pen Inject 15 Units into the skin daily at 10 pm. 12/05/14   Lorayne Marek, MD  metFORMIN (GLUCOPHAGE) 1000 MG tablet Take 1 tablet (1,000 mg total) by mouth 2 (two) times daily with a meal. 11/13/14   Venetia Maxon Rama, MD  pravastatin (PRAVACHOL) 20 MG tablet Take 1 tablet (20 mg total) by mouth daily. 12/05/14   Lorayne Marek, MD  Vitamin D, Ergocalciferol, (DRISDOL) 50000 UNITS CAPS capsule Take 1 capsule (50,000 Units total) by mouth every 7 (seven) days. 12/08/14    Lorayne Marek, MD   BP 134/82 mmHg  Pulse 85  Temp(Src) 98.9 F (37.2 C) (Oral)  Resp 18  SpO2 96% Physical Exam  Constitutional: He appears well-developed and well-nourished.  HENT:  Head: Normocephalic and atraumatic.  Mouth/Throat: Oropharynx is clear and moist.  Eyes: EOM are normal. Pupils are equal, round, and reactive to light.  Neck: Normal range of motion. Neck supple.  Cardiovascular: Normal rate, regular rhythm and normal heart sounds.   Pulmonary/Chest: Effort normal and breath sounds normal. No respiratory distress. He has no wheezes. He has no rales.  Abdominal: Soft. Bowel sounds are normal. He exhibits no distension and no mass. There is no tenderness. There is no rebound and no guarding.  Neurological: He is alert. He has normal strength. No cranial nerve deficit or sensory deficit.  Skin: Skin is warm and dry.  Psychiatric: He has a normal mood and affect.  Nursing note and vitals reviewed.   ED Course  Procedures (including critical care time) Labs Review Labs Reviewed  CBC  COMPREHENSIVE METABOLIC PANEL  URINALYSIS, ROUTINE W REFLEX MICROSCOPIC  CBG MONITORING, ED  Randolm Idol, ED    Imaging Review Dg Chest 2 View  02/11/2015   CLINICAL DATA:  Shortness of breath, cough, body aches  EXAM: CHEST  2 VIEW  COMPARISON:  05/30/2014  FINDINGS: Lungs are clear.  No pleural effusion or pneumothorax.  The heart is normal in size.  Mild degenerative changes of the visualized thoracolumbar spine.  IMPRESSION: No evidence of acute cardiopulmonary disease.   Electronically Signed   By: Julian Hy M.D.   On: 02/11/2015 21:13     EKG Interpretation   Date/Time:  Saturday February 11 2015 35:32:99 EDT Ventricular Rate:  97 PR Interval:  173 QRS Duration: 101 QT Interval:  358 QTC Calculation: 455 R Axis:   129 Text Interpretation:  Sinus rhythm Probable left atrial enlargement  Consider right ventricular hypertrophy Borderline ST elevation, anterior   leads non specific t wave changes lateral leads since last tracing  Confirmed by KNAPP  MD-J, JON ([id number]) on 02/11/2015 8:37:49 PM      MDM   Final diagnoses:  None   Patient presents today with multiple complaints.  He reports body aches, SOB, cough, fever, chills, nausea, and diarrhea.  Onset of symptoms 2-3 days ago.  VSS.  No hypoxia.  CXR is negative.  No ischemic changes on EKG.  Labs unremarkable.  UA is negative.  He is Diabetic and reports that his blood sugars have been running high.  Blood sugar 261 in the ED.  Anion gap is 12.  No ketones in the Urine.  Therefore, do not feel that the patient is in DKA.  Patient tolerating PO liquids.  Feel that the patient is stable for discharge.  Return precautions given.    Hyman Bible, PA-C 02/12/15 2228  Dorie Rank, MD 02/13/15 [phone]

## 2015-02-11 NOTE — ED Notes (Signed)
Bed: HW86 Expected date: 02/11/15 Expected time: 7:17 PM Means of arrival: Ambulance Comments: Weakness, N/V pain

## 2015-02-11 NOTE — ED Notes (Signed)
Patient with multiple complaints: N/V, SOB, "Everything hurts", Cough Patient non-compliant with taking home medications Patient alert and oriented x 4 Patient in NAD upon arrival to ED

## 2015-04-18 ENCOUNTER — Encounter (HOSPITAL_COMMUNITY): Payer: Self-pay | Admitting: Emergency Medicine

## 2015-04-18 ENCOUNTER — Emergency Department (HOSPITAL_COMMUNITY)
Admission: EM | Admit: 2015-04-18 | Discharge: 2015-04-18 | Disposition: A | Discharge status: Home / Self Care | Payer: Self-pay | Attending: Emergency Medicine | Admitting: Emergency Medicine

## 2015-04-18 DIAGNOSIS — E785 Hyperlipidemia, unspecified: Secondary | ICD-10-CM | POA: Insufficient documentation

## 2015-04-18 DIAGNOSIS — E11621 Type 2 diabetes mellitus with foot ulcer: Secondary | ICD-10-CM | POA: Insufficient documentation

## 2015-04-18 DIAGNOSIS — Z720 Tobacco use: Secondary | ICD-10-CM | POA: Insufficient documentation

## 2015-04-18 DIAGNOSIS — Z794 Long term (current) use of insulin: Secondary | ICD-10-CM | POA: Insufficient documentation

## 2015-04-18 DIAGNOSIS — L97509 Non-pressure chronic ulcer of other part of unspecified foot with unspecified severity: Secondary | ICD-10-CM | POA: Insufficient documentation

## 2015-04-18 DIAGNOSIS — Z79899 Other long term (current) drug therapy: Secondary | ICD-10-CM | POA: Insufficient documentation

## 2015-04-18 DIAGNOSIS — E669 Obesity, unspecified: Secondary | ICD-10-CM | POA: Insufficient documentation

## 2015-04-18 DIAGNOSIS — Z7982 Long term (current) use of aspirin: Secondary | ICD-10-CM | POA: Insufficient documentation

## 2015-04-18 DIAGNOSIS — R2241 Localized swelling, mass and lump, right lower limb: Secondary | ICD-10-CM | POA: Insufficient documentation

## 2015-04-18 DIAGNOSIS — E119 Type 2 diabetes mellitus without complications: Secondary | ICD-10-CM | POA: Insufficient documentation

## 2015-04-18 DIAGNOSIS — R600 Localized edema: Secondary | ICD-10-CM

## 2015-04-18 LAB — I-STAT CHEM 8, ED
BUN: 14 mg/dL (ref 6–20)
CREATININE: 0.7 mg/dL (ref 0.61–1.24)
Calcium, Ion: 1.15 mmol/L (ref 1.12–1.23)
Chloride: 99 mmol/L — ABNORMAL LOW (ref 101–111)
Glucose, Bld: 236 mg/dL — ABNORMAL HIGH (ref 65–99)
HCT: 47 % (ref 39.0–52.0)
Hemoglobin: 16 g/dL (ref 13.0–17.0)
Potassium: 3.9 mmol/L (ref 3.5–5.1)
Sodium: 135 mmol/L (ref 135–145)
TCO2: 22 mmol/L (ref 0–100)

## 2015-04-18 LAB — URINALYSIS, ROUTINE W REFLEX MICROSCOPIC
Bilirubin Urine: NEGATIVE
GLUCOSE, UA: 100 mg/dL — AB
HGB URINE DIPSTICK: NEGATIVE
KETONES UR: NEGATIVE mg/dL
Leukocytes, UA: NEGATIVE
Nitrite: NEGATIVE
PROTEIN: NEGATIVE mg/dL
Specific Gravity, Urine: 1.009 (ref 1.005–1.030)
Urobilinogen, UA: 0.2 mg/dL (ref 0.0–1.0)
pH: 5.5 (ref 5.0–8.0)

## 2015-04-18 MED ORDER — KETOROLAC TROMETHAMINE 30 MG/ML IJ SOLN
30.0000 mg | Freq: Once | INTRAMUSCULAR | Status: DC
Start: 2015-04-18 — End: 2015-04-18
  Filled 2015-04-18: qty 1

## 2015-04-18 MED ORDER — KETOROLAC TROMETHAMINE 30 MG/ML IJ SOLN
30.0000 mg | Freq: Once | INTRAMUSCULAR | Status: AC
  Administered 2015-04-18: 30 mg via INTRAVENOUS

## 2015-04-18 NOTE — ED Notes (Signed)
Pt. Stated, My leg was more than usual swollen this morning. It hurts a little.

## 2015-04-18 NOTE — ED Provider Notes (Signed)
CSN: [id number]     Arrival date & time 04/18/15  1119 History   First MD Initiated Contact with Patient 04/18/15 1345     Chief Complaint  Patient presents with  . Leg Swelling  . Leg Pain     (Consider location/radiation/quality/duration/timing/severity/associated sxs/prior Treatment) HPI  Patient presents with concern of ongoing swelling right lower extremity. Patient has swelling for a very long time, typically has slight changes in the dimension. Patient also has soreness in the right lower extremity. No appreciable change in the soreness, though the swelling seems more pronounced than usual. No new medication changes, activity changes, diet changes. Patient specifically denies chest pain, dyspnea. Patient has had multiple ultrasounds, for evaluation, without DVT identified however. He has diagnosis of varicosity. At work today, with concern for increased girth of the leg, he was sent here for evaluation.  Past Medical History  Diagnosis Date  . Diabetes mellitus   . Varicose veins with ulcer and inflammation     Right leg  . Diabetic foot ulcer 03/20/2011  . Diabetes type 2, uncontrolled 03/20/2011  . Vein, varicose 03/20/2011  . Syncope 05/30/2014  . Other and unspecified hyperlipidemia 05/31/2014  . Obesity, unspecified 05/31/2014  . Tobacco use disorder 05/31/2014  . Superficial vein thrombosis 06/01/2014  . Cellulitis of right leg 11/12/2014  . Elevated TSH 11/13/2014  . Tendinitis of elbow or forearm 11/13/2014   Past Surgical History  Procedure Laterality Date  . Appendectomy     Family History  Problem Relation Age of Onset  . Cancer Mother     breast cancer   . Hypertension Mother   . Hypertension Father    History  Substance Use Topics  . Smoking status: Current Every Day Smoker -- 1.00 packs/day for 30 years    Types: Cigarettes  . Smokeless tobacco: Not on file  . Alcohol Use: No     Comment: occassionally    Review of Systems  Constitutional:       Per  HPI, otherwise negative  HENT:       Per HPI, otherwise negative  Respiratory:       Per HPI, otherwise negative  Cardiovascular:       Per HPI, otherwise negative  Gastrointestinal: Negative for vomiting.  Endocrine:       Negative aside from HPI  Genitourinary:       Neg aside from HPI   Musculoskeletal:       Per HPI, otherwise negative  Skin: Negative.  Negative for color change.  Neurological: Negative for syncope.      Allergies  Review of patient's allergies indicates no known allergies.  Home Medications   Prior to Admission medications   Medication Sig Start Date End Date Taking? Authorizing Provider  acetaminophen (TYLENOL) 325 MG tablet Take 2 tablets (650 mg total) by mouth every 6 (six) hours as needed for mild pain. 06/01/14  Yes Delfina Redwood, MD  aspirin EC 81 MG tablet Take 1 tablet (81 mg total) by mouth daily. 06/01/14  Yes Delfina Redwood, MD  buPROPion [facility] SR) 150 MG 12 hr tablet Take 1 tablet (150 mg total) by mouth 2 (two) times daily. 12/05/14  Yes Lorayne Marek, MD  clotrimazole-betamethasone (LOTRISONE) cream Apply 1 application topically 2 (two) times daily. 12/05/14  Yes Lorayne Marek, MD  gabapentin (NEURONTIN) 100 MG capsule Take 1 capsule (100 mg total) by mouth 3 (three) times daily. 12/05/14  Yes Lorayne Marek, MD  Insulin Glargine (LANTUS) 100 UNIT/ML  Solostar Pen Inject 15 Units into the skin daily at 10 pm. 12/05/14  Yes Lorayne Marek, MD  metFORMIN (GLUCOPHAGE) 1000 MG tablet Take 1 tablet (1,000 mg total) by mouth 2 (two) times daily with a meal. 11/13/14  Yes Christina P Rama, MD  pravastatin (PRAVACHOL) 20 MG tablet Take 1 tablet (20 mg total) by mouth daily. 12/05/14  Yes Deepak Advani, MD   BP 130/82 mmHg  Pulse 79  Temp(Src) 98.6 F (37 C) (Oral)  Resp 16  Ht 6\' 3"  (1.905 m)  Wt 322 lb (146.058 kg)  BMI 40.25 kg/m2  SpO2 96% Physical Exam  Constitutional: He is oriented to person, place, and time. He appears  well-developed. No distress.  Obese male sitting upright, in no distress  HENT:  Head: Normocephalic and atraumatic.  Eyes: Conjunctivae and EOM are normal.  Cardiovascular: Normal rate and regular rhythm.   Pulmonary/Chest: Effort normal. No stridor. No respiratory distress.  Abdominal: He exhibits no distension.  Musculoskeletal: He exhibits edema.  Asymmetric edema of the lower extremities, without pain on palpation. The right leg has cutaneous changes consistent with chronic stasis, no surrounding erythema, drainage, bleeding  Neurological: He is alert and oriented to person, place, and time.  Skin: Skin is warm and dry.  Psychiatric: He has a normal mood and affect.  Nursing note and vitals reviewed.   ED Course  Procedures (including critical care time) Labs Review Labs Reviewed  URINALYSIS, ROUTINE W REFLEX MICROSCOPIC (NOT AT [facility]) - Abnormal; Notable for the following:    Glucose, UA 100 (*)    All other components within normal limits  I-STAT CHEM 8, ED - Abnormal; Notable for the following:    Chloride 99 (*)    Glucose, Bld 236 (*)    All other components within normal limits     MDM  Patient presents with persistent right lower extremity edema. Patient has been evaluated multiple times, and denies acute changes, and on exam is awake, alert, afebrile, distally neurovascularly intact, answering questions properly, moving extremities appropriately. Patient's labs are largely reassuring aside from mild hyperglycemia. Patient was provided analgesia, will follow up with vascular and vein specialists  Carmin Muskrat, MD 04/18/15 [phone]

## 2015-04-18 NOTE — ED Notes (Signed)
Pt A&OX4, ambulatory at d/c with steady gait, NAD 

## 2015-04-18 NOTE — Discharge Instructions (Signed)
As discussed, your evaluation today has been largely reassuring.  But, it is important that you monitor your condition carefully, and do not hesitate to return to the ED if you develop new, or concerning changes in your condition.  Otherwise, please follow-up with your physician for appropriate ongoing care.   Peripheral Edema You have swelling in your legs (peripheral edema). This swelling is due to excess accumulation of salt and water in your body. Edema may be a sign of heart, kidney or liver disease, or a side effect of a medication. It may also be due to problems in the leg veins. Elevating your legs and using special support stockings may be very helpful, if the cause of the swelling is due to poor venous circulation. Avoid long periods of standing, whatever the cause. Treatment of edema depends on identifying the cause. Chips, pretzels, pickles and other salty foods should be avoided. Restricting salt in your diet is almost always needed. Water pills (diuretics) are often used to remove the excess salt and water from your body via urine. These medicines prevent the kidney from reabsorbing sodium. This increases urine flow. Diuretic treatment may also result in lowering of potassium levels in your body. Potassium supplements may be needed if you have to use diuretics daily. Daily weights can help you keep track of your progress in clearing your edema. You should call your caregiver for follow up care as recommended. SEEK IMMEDIATE MEDICAL CARE IF:   You have increased swelling, pain, redness, or heat in your legs.  You develop shortness of breath, especially when lying down.  You develop chest or abdominal pain, weakness, or fainting.  You have a fever. Document Released: 11/07/2004 Document Revised: 12/23/2011 Document Reviewed: 10/18/2009 [facility] Patient Information 2015 [place], [place]. This information is not intended to replace advice given to you by your health care provider. Make  sure you discuss any questions you have with your health care provider.

## 2016-03-01 ENCOUNTER — Emergency Department (HOSPITAL_COMMUNITY): Payer: Self-pay

## 2016-03-01 ENCOUNTER — Emergency Department (HOSPITAL_COMMUNITY)
Admission: EM | Admit: 2016-03-01 | Discharge: 2016-03-01 | Disposition: A | Discharge status: Home / Self Care | Payer: Self-pay | Attending: Emergency Medicine | Admitting: Emergency Medicine

## 2016-03-01 ENCOUNTER — Encounter (HOSPITAL_COMMUNITY): Payer: Self-pay | Admitting: Emergency Medicine

## 2016-03-01 DIAGNOSIS — Z79899 Other long term (current) drug therapy: Secondary | ICD-10-CM | POA: Insufficient documentation

## 2016-03-01 DIAGNOSIS — Z872 Personal history of diseases of the skin and subcutaneous tissue: Secondary | ICD-10-CM | POA: Insufficient documentation

## 2016-03-01 DIAGNOSIS — M79604 Pain in right leg: Secondary | ICD-10-CM | POA: Insufficient documentation

## 2016-03-01 DIAGNOSIS — I83891 Varicose veins of right lower extremities with other complications: Secondary | ICD-10-CM

## 2016-03-01 DIAGNOSIS — I8391 Asymptomatic varicose veins of right lower extremity: Secondary | ICD-10-CM | POA: Insufficient documentation

## 2016-03-01 DIAGNOSIS — F17210 Nicotine dependence, cigarettes, uncomplicated: Secondary | ICD-10-CM | POA: Insufficient documentation

## 2016-03-01 DIAGNOSIS — E119 Type 2 diabetes mellitus without complications: Secondary | ICD-10-CM | POA: Insufficient documentation

## 2016-03-01 DIAGNOSIS — Z7984 Long term (current) use of oral hypoglycemic drugs: Secondary | ICD-10-CM | POA: Insufficient documentation

## 2016-03-01 DIAGNOSIS — R42 Dizziness and giddiness: Secondary | ICD-10-CM | POA: Insufficient documentation

## 2016-03-01 DIAGNOSIS — Z7982 Long term (current) use of aspirin: Secondary | ICD-10-CM | POA: Insufficient documentation

## 2016-03-01 DIAGNOSIS — E669 Obesity, unspecified: Secondary | ICD-10-CM | POA: Insufficient documentation

## 2016-03-01 DIAGNOSIS — Z86718 Personal history of other venous thrombosis and embolism: Secondary | ICD-10-CM | POA: Insufficient documentation

## 2016-03-01 DIAGNOSIS — E785 Hyperlipidemia, unspecified: Secondary | ICD-10-CM | POA: Insufficient documentation

## 2016-03-01 DIAGNOSIS — Z794 Long term (current) use of insulin: Secondary | ICD-10-CM | POA: Insufficient documentation

## 2016-03-01 LAB — URINE MICROSCOPIC-ADD ON
BACTERIA UA: NONE SEEN
RBC / HPF: NONE SEEN RBC/hpf (ref 0–5)

## 2016-03-01 LAB — COMPREHENSIVE METABOLIC PANEL
ALT: 19 U/L (ref 17–63)
ANION GAP: 10 (ref 5–15)
AST: 15 U/L (ref 15–41)
Albumin: 3.5 g/dL (ref 3.5–5.0)
Alkaline Phosphatase: 80 U/L (ref 38–126)
BUN: 11 mg/dL (ref 6–20)
CO2: 24 mmol/L (ref 22–32)
Calcium: 9.1 mg/dL (ref 8.9–10.3)
Chloride: 100 mmol/L — ABNORMAL LOW (ref 101–111)
Creatinine, Ser: 0.74 mg/dL (ref 0.61–1.24)
GFR calc Af Amer: >60 mL/min (ref >60)
GLUCOSE: 310 mg/dL — AB (ref 65–99)
POTASSIUM: 4.5 mmol/L (ref 3.5–5.1)
Sodium: 134 mmol/L — ABNORMAL LOW (ref 135–145)
TOTAL PROTEIN: 6.2 g/dL — AB (ref 6.5–8.1)
Total Bilirubin: 0.3 mg/dL (ref 0.3–1.2)

## 2016-03-01 LAB — ETHANOL: Alcohol, Ethyl (B): <5 mg/dL (ref <5)

## 2016-03-01 LAB — CBG MONITORING, ED: GLUCOSE-CAPILLARY: 286 mg/dL — AB (ref 65–99)

## 2016-03-01 LAB — APTT: aPTT: 25 seconds (ref 24–37)

## 2016-03-01 LAB — RAPID URINE DRUG SCREEN, HOSP PERFORMED
Amphetamines: NOT DETECTED
BARBITURATES: NOT DETECTED
Benzodiazepines: NOT DETECTED
COCAINE: NOT DETECTED
Opiates: NOT DETECTED
TETRAHYDROCANNABINOL: NOT DETECTED

## 2016-03-01 LAB — I-STAT CHEM 8, ED
BUN: 13 mg/dL (ref 6–20)
CALCIUM ION: 1.22 mmol/L (ref 1.12–1.23)
CREATININE: 0.6 mg/dL — AB (ref 0.61–1.24)
Chloride: 96 mmol/L — ABNORMAL LOW (ref 101–111)
GLUCOSE: 296 mg/dL — AB (ref 65–99)
HCT: 42 % (ref 39.0–52.0)
HEMOGLOBIN: 14.3 g/dL (ref 13.0–17.0)
Potassium: 4.4 mmol/L (ref 3.5–5.1)
Sodium: 135 mmol/L (ref 135–145)
TCO2: 24 mmol/L (ref 0–100)

## 2016-03-01 LAB — PROTIME-INR
INR: 1.06 (ref 0.00–1.49)
Prothrombin Time: 14 seconds (ref 11.6–15.2)

## 2016-03-01 LAB — URINALYSIS, ROUTINE W REFLEX MICROSCOPIC
Bilirubin Urine: NEGATIVE
Glucose, UA: >1000 mg/dL — AB
Hgb urine dipstick: NEGATIVE
Ketones, ur: NEGATIVE mg/dL
LEUKOCYTES UA: NEGATIVE
NITRITE: NEGATIVE
PROTEIN: NEGATIVE mg/dL
Specific Gravity, Urine: 1.036 — ABNORMAL HIGH (ref 1.005–1.030)
pH: 6 (ref 5.0–8.0)

## 2016-03-01 LAB — CBC
HEMATOCRIT: 40.1 % (ref 39.0–52.0)
HEMOGLOBIN: 13.6 g/dL (ref 13.0–17.0)
MCH: 28.7 pg (ref 26.0–34.0)
MCHC: 33.9 g/dL (ref 30.0–36.0)
MCV: 84.6 fL (ref 78.0–100.0)
Platelets: 268 10*3/uL (ref 150–400)
RBC: 4.74 MIL/uL (ref 4.22–5.81)
RDW: 13.1 % (ref 11.5–15.5)
WBC: 9 10*3/uL (ref 4.0–10.5)

## 2016-03-01 LAB — DIFFERENTIAL
Basophils Absolute: 0 10*3/uL (ref 0.0–0.1)
Basophils Relative: 0 %
EOS ABS: 0.1 10*3/uL (ref 0.0–0.7)
EOS PCT: 2 %
LYMPHS PCT: 30 %
Lymphs Abs: 2.7 10*3/uL (ref 0.7–4.0)
MONOS PCT: 5 %
Monocytes Absolute: 0.5 10*3/uL (ref 0.1–1.0)
Neutro Abs: 5.7 10*3/uL (ref 1.7–7.7)
Neutrophils Relative %: 63 %

## 2016-03-01 LAB — I-STAT TROPONIN, ED: Troponin i, poc: 0.01 ng/mL (ref 0.00–0.08)

## 2016-03-01 LAB — I-STAT CG4 LACTIC ACID, ED: Lactic Acid, Venous: 1.58 mmol/L (ref 0.5–2.0)

## 2016-03-01 MED ORDER — SODIUM CHLORIDE 0.9 % IV BOLUS (SEPSIS)
2000.0000 mL | Freq: Once | INTRAVENOUS | Status: AC
  Administered 2016-03-01: 2000 mL via INTRAVENOUS

## 2016-03-01 MED ORDER — SODIUM CHLORIDE 0.9 % IV BOLUS (SEPSIS)
30.0000 mL/kg | Freq: Once | INTRAVENOUS | Status: DC
  Filled 2016-03-01: qty 4200

## 2016-03-01 NOTE — ED Notes (Signed)
Pt ambulated to bathroom favoring R leg.  Gait not smooth and even.

## 2016-03-01 NOTE — ED Notes (Signed)
Pt arrives from work via [employer] reporting ongoing RLE swelling and pain, sudden worsening today at 1000.  Pt reports being mildly dizzy upon waking today, significant increase in dizziness with onset of headache at 1000.  Pt reports numbness to R foot and R hand, some numbness to L foot.  Dr Eulis Foster at bedside.

## 2016-03-01 NOTE — ED Provider Notes (Signed)
CSN: [id number]     Arrival date & time 03/01/16  1103 History   First MD Initiated Contact with Patient 03/01/16 1108     Chief Complaint  Patient presents with  . Dizziness  . Headache  . Leg Swelling     (Consider location/radiation/quality/duration/timing/severity/associated sxs/prior Treatment) The history is provided by the patient.     EDP evaluation, at 11:10: Cody Harper is a 50 y.o. male presents for evaluation of right leg pain and swelling, dizziness, numbness, and dizziness. The patient went to work today and after arrival there, noticed some numbness in his left foot, and dizziness, both of which symptoms are new. He has chronic right leg pain with intermittent swelling, and chronic numbness in his right hand and right foot. The numbness in his left foot, today is new. He also has a headache which started around the time of the dizziness. He works as a Veterinary surgeon. He takes medicines regularly and did not miss any by his report. He denies recent fever, chills, cough, shortness of breath or chest pain. There are no other known modifying factors.   Past Medical History  Diagnosis Date  . Diabetes mellitus   . 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 Diabetic foot ulcer ([place]) 03/20/2011  . Diabetes type 2, uncontrolled ([place]) 03/20/2011  . Vein, varicose 03/20/2011  . Syncope 05/30/2014  . Other and unspecified hyperlipidemia 05/31/2014  . Obesity, unspecified 05/31/2014  . Tobacco use disorder 05/31/2014  . Superficial vein thrombosis 06/01/2014  . Cellulitis of right leg 11/12/2014  . Elevated TSH 11/13/2014  . Tendinitis of elbow or forearm 11/13/2014   Past Surgical History  Procedure Laterality Date  . Appendectomy     Family History  Problem Relation Age of Onset  . Cancer Mother     breast cancer   . Hypertension Mother   . Hypertension Father    Social History  Substance Use Topics  . Smoking status: Current Every Day Smoker -- 1.00  packs/day for 30 years    Types: Cigarettes  . Smokeless tobacco: None  . Alcohol Use: No     Comment: occassionally    Review of Systems  All other systems reviewed and are negative.     Allergies  Review of patient's allergies indicates no known allergies.  Home Medications   Prior to Admission medications   Medication Sig Start Date End Date Taking? Authorizing Provider  acetaminophen (TYLENOL) 500 MG tablet Take 1,000-1,500 mg by mouth every 6 (six) hours as needed (pain).   Yes Historical Provider, MD  insulin NPH-regular Human (NOVOLIN 70/30) (70-30) 100 UNIT/ML injection Inject 20 Units into the skin daily with breakfast.   Yes Historical Provider, MD  Multiple Vitamin (MULTIVITAMIN WITH MINERALS) TABS tablet Take 1 tablet by mouth daily.   Yes Historical Provider, MD  acetaminophen (TYLENOL) 325 MG tablet Take 2 tablets (650 mg total) by mouth every 6 (six) hours as needed for mild pain. Patient not taking: Reported on 03/01/2016 06/01/14   Delfina Redwood, MD  aspirin EC 81 MG tablet Take 1 tablet (81 mg total) by mouth daily. Patient not taking: Reported on 03/01/2016 06/01/14   Delfina Redwood, MD  buPROPion [facility] SR) 150 MG 12 hr tablet Take 1 tablet (150 mg total) by mouth 2 (two) times daily. Patient not taking: Reported on 03/01/2016 12/05/14   Lorayne Marek, MD  clotrimazole-betamethasone (LOTRISONE) cream Apply 1 application topically 2 (two)  times daily. Patient not taking: Reported on 03/01/2016 12/05/14   Lorayne Marek, MD  gabapentin (NEURONTIN) 100 MG capsule Take 1 capsule (100 mg total) by mouth 3 (three) times daily. Patient not taking: Reported on 03/01/2016 12/05/14   Lorayne Marek, MD  Insulin Glargine (LANTUS) 100 UNIT/ML Solostar Pen Inject 15 Units into the skin daily at 10 pm. Patient not taking: Reported on 03/01/2016 12/05/14   Lorayne Marek, MD  metFORMIN (GLUCOPHAGE) 1000 MG tablet Take 1 tablet (1,000 mg total) by mouth 2 (two) times daily with  a meal. Patient not taking: Reported on 03/01/2016 11/13/14   Venetia Maxon Rama, MD  pravastatin (PRAVACHOL) 20 MG tablet Take 1 tablet (20 mg total) by mouth daily. Patient not taking: Reported on 03/01/2016 12/05/14   Lorayne Marek, MD   BP 129/82 mmHg  Pulse 68  Temp(Src) 98.4 F (36.9 C) (Oral)  Resp 16  Ht 6\' 2"  (1.88 m)  Wt 298 lb (135.172 kg)  BMI 38.24 kg/m2  SpO2 100% Physical Exam  Constitutional: He is oriented to person, place, and time. He appears well-developed.  He appears older than stated age.  HENT:  Head: Normocephalic and atraumatic.  Right Ear: External ear normal.  Left Ear: External ear normal.  Eyes: Conjunctivae and EOM are normal. Pupils are equal, round, and reactive to light.  Neck: Normal range of motion and phonation normal. Neck supple.  Cardiovascular: Normal rate, regular rhythm and normal heart sounds.   Pulmonary/Chest: Effort normal and breath sounds normal. He exhibits no bony tenderness.  Abdominal: Soft. There is no tenderness.  Musculoskeletal: Normal range of motion.  Brawny edema of the right lower leg, with skin changes, and mild diffuse tenderness. Right lower leg is much larger than the left lower leg. Decreased movement of the right leg secondary to pain in the right lower leg.  Neurological: He is alert and oriented to person, place, and time. No cranial nerve deficit or sensory deficit. He exhibits normal muscle tone. Coordination normal.  No dysarthria and aphasia or nystagmus. No pronator drift.  Skin: Skin is warm, dry and intact.  Psychiatric: He has a normal mood and affect. His behavior is normal. Judgment and thought content normal.  Nursing note and vitals reviewed.   ED Course  Procedures (including critical care time)  Initial clinical impression- nonspecific paresthesia, with chronic right leg pain. Doubt CVA, ACS or sepsis. Will screen for serious illnesses with imaging, labs, and treat with IV fluids, for  hyperglycemia.  Medications  sodium chloride 0.9 % bolus 2,000 mL (0 mLs Intravenous Stopped 03/01/16 1536)    Patient Vitals for the past 24 hrs:  BP Temp Temp src Pulse Resp SpO2 Height Weight  03/01/16 1522 129/82 mmHg - - 68 16 100 % - -  03/01/16 1515 129/82 mmHg - - 66 20 100 % - -  03/01/16 1500 130/91 mmHg - - 66 20 99 % - -  03/01/16 1430 138/87 mmHg - - 64 17 99 % - -  03/01/16 1415 133/83 mmHg - - 64 19 99 % - -  03/01/16 1400 142/85 mmHg - - 64 19 100 % - -  03/01/16 1330 131/86 mmHg - - 64 19 99 % - -  03/01/16 1315 150/88 mmHg - - 64 16 99 % - -  03/01/16 1300 131/85 mmHg - - 65 18 99 % - -  03/01/16 1245 130/80 mmHg - - 65 15 99 % - -  03/01/16 1145 132/88 mmHg - -  72 20 97 % - -  03/01/16 1130 128/86 mmHg - - 69 23 97 % - -  03/01/16 1115 125/87 mmHg - - 72 26 96 % - -  03/01/16 1112 - - - - - 96 % - -  03/01/16 1108 135/88 mmHg 98.4 F (36.9 C) Oral 72 16 97 % 6\' 2"  (1.88 m) 298 lb (135.172 kg)  03/01/16 1103 - - - - - 97 % - -    At D/C Reevaluation with update and discussion. After initial assessment and treatment, an updated evaluation reveals he is able to walk and denies dizziness. Findings discussed with patient, and all questions answered. Lucette Kratz L       Labs Review Labs Reviewed  COMPREHENSIVE METABOLIC PANEL - Abnormal; Notable for the following:    Sodium 134 (*)    Chloride 100 (*)    Glucose, Bld 310 (*)    Total Protein 6.2 (*)    All other components within normal limits  URINALYSIS, ROUTINE W REFLEX MICROSCOPIC (NOT AT [facility]) - Abnormal; Notable for the following:    Specific Gravity, Urine 1.036 (*)    Glucose, UA >1000 (*)    All other components within normal limits  URINE MICROSCOPIC-ADD ON - Abnormal; Notable for the following:    Squamous Epithelial / LPF 0-5 (*)    All other components within normal limits  I-STAT CHEM 8, ED - Abnormal; Notable for the following:    Chloride 96 (*)    Creatinine, Ser 0.60 (*)    Glucose,  Bld 296 (*)    All other components within normal limits  CBG MONITORING, ED - Abnormal; Notable for the following:    Glucose-Capillary 286 (*)    All other components within normal limits  ETHANOL  PROTIME-INR  APTT  CBC  DIFFERENTIAL  URINE RAPID DRUG SCREEN, HOSP PERFORMED  I-STAT TROPOININ, ED  I-STAT CG4 LACTIC ACID, ED    Imaging Review Ct Head Wo Contrast  03/01/2016  CLINICAL DATA:  Mild dizziness, headache EXAM: CT HEAD WITHOUT CONTRAST TECHNIQUE: Contiguous axial images were obtained from the base of the skull through the vertex without intravenous contrast. COMPARISON:  11/11/2014 FINDINGS: No evidence of parenchymal hemorrhage or extra-axial fluid collection. No mass lesion, mass effect, or midline shift. No CT evidence of acute infarction. Cerebral volume is within normal limits.  No ventriculomegaly. Mild mucosal thickening of the bilateral maxillary sinuses. Visualized paranasal sinuses and mastoid air cells are otherwise clear. No evidence of calvarial fracture. IMPRESSION: No evidence of acute intracranial abnormality. Electronically Signed   By: Julian Hy M.D.   On: 03/01/2016 12:17   I have personally reviewed and evaluated these images and lab results as part of my medical decision-making.   EKG Interpretation   Date/Time:  Friday Mar 01 2016 11:03:48 EDT Ventricular Rate:  71 PR Interval:  191 QRS Duration: 108 QT Interval:  407 QTC Calculation: 442 R Axis:   95 Text Interpretation:  Sinus rhythm Consider right ventricular hypertrophy  Inferior infarct, age indeterminate ST elevation, consider anterolateral  injury since last tracing no significant change Confirmed by Eulis Foster  MD,  Enez Monahan [phone]) on 03/01/2016 11:10:05 AM Also confirmed by Eulis Foster  MD,  Kendan Cornforth [phone]), editor Stout CT, Leda Gauze [phone])  on 03/01/2016 12:28:17  PM      MDM   Final diagnoses:  Dizziness  Varicose veins of leg with swelling, right  Right leg pain    Nonspecific  dizziness and numbness.Chronic right leg swelling  and pain. Doubt CVA, DVT, lumbar myelopathy.   Nursing Notes Reviewed/ Care Coordinated Applicable Imaging Reviewed Interpretation of Laboratory Data incorporated into ED treatment  The patient appears reasonably screened and/or stabilized for discharge and I doubt any other medical condition or other [facility] requiring further screening, evaluation, or treatment in the ED at this time prior to discharge.  Plan: Home Medications- usual; Home Treatments- rest, elevate feet; return here if the recommended treatment, does not improve the symptoms; Recommended follow up- PCP 1 week for check up     Daleen Bo, MD 03/01/16 [phone]

## 2016-03-01 NOTE — Discharge Instructions (Signed)
Dizziness Dizziness is a common problem. It is a feeling of unsteadiness or light-headedness. You may feel like you are about to faint. Dizziness can lead to injury if you stumble or fall. Anyone can become dizzy, but dizziness is more common in older adults. This condition can be caused by a number of things, including medicines, dehydration, or illness. HOME CARE INSTRUCTIONS Taking these steps may help with your condition: Eating and Drinking  Drink enough fluid to keep your urine clear or pale yellow. This helps to keep you from becoming dehydrated. Try to drink more clear fluids, such as water.  Do not drink alcohol.  Limit your caffeine intake if directed by your health care provider.  Limit your salt intake if directed by your health care provider. Activity  Avoid making quick movements.  Rise slowly from chairs and steady yourself until you feel okay.  In the morning, first sit up on the side of the bed. When you feel okay, stand slowly while you hold onto something until you know that your balance is fine.  Move your legs often if you need to stand in one place for a long time. Tighten and relax your muscles in your legs while you are standing.  Do not drive or operate heavy machinery if you feel dizzy.  Avoid bending down if you feel dizzy. Place items in your home so that they are easy for you to reach without leaning over. Lifestyle  Do not use any tobacco products, including cigarettes, chewing tobacco, or electronic cigarettes. If you need help quitting, ask your health care provider.  Try to reduce your stress level, such as with yoga or meditation. Talk with your health care provider if you need help. General Instructions  Watch your dizziness for any changes.  Take medicines only as directed by your health care provider. Talk with your health care provider if you think that your dizziness is caused by a medicine that you are taking.  Tell a friend or a family  member that you are feeling dizzy. If he or she notices any changes in your behavior, have this person call your health care provider.  Keep all follow-up visits as directed by your health care provider. This is important. SEEK MEDICAL CARE IF:  Your dizziness does not go away.  Your dizziness or light-headedness gets worse.  You feel nauseous.  You have reduced hearing.  You have new symptoms.  You are unsteady on your feet or you feel like the room is spinning. SEEK IMMEDIATE MEDICAL CARE IF:  You vomit or have diarrhea and are unable to eat or drink anything.  You have problems talking, walking, swallowing, or using your arms, hands, or legs.  You feel generally weak.  You are not thinking clearly or you have trouble forming sentences. It may take a friend or family member to notice this.  You have chest pain, abdominal pain, shortness of breath, or sweating.  Your vision changes.  You notice any bleeding.  You have a headache.  You have neck pain or a stiff neck.  You have a fever.   This information is not intended to replace advice given to you by your health care provider. Make sure you discuss any questions you have with your health care provider.   Document Released: 03/26/2001 Document Revised: 02/14/2015 Document Reviewed: 09/26/2014 Elsevier Interactive Patient Education 2016 Else[employer] Inc[employer]  Varicose Veins Varicose veins are veins that have become enlarged and twisted. They are usually seen in the  legs but can occur in other parts of the body as well. CAUSES This condition is the result of valves in the veins not working properly. Valves in the veins help to return blood from the leg to the heart. If these valves are damaged, blood flows backward and backs up into the veins in the leg near the skin. This causes the veins to become larger. RISK FACTORS People who are on their feet a lot, who are pregnant, or who are overweight are more likely to develop  varicose veins. SIGNS AND SYMPTOMS  Bulging, twisted-appearing, bluish veins, most commonly found on the legs.  Leg pain or a feeling of heaviness. These symptoms may be worse at the end of the day.  Leg swelling.  Changes in skin color. DIAGNOSIS A health care provider can usually diagnose varicose veins by examining your legs. Your health care provider may also recommend an ultrasound of your leg veins. TREATMENT Most varicose veins can be treated at home.However, other treatments are available for people who have persistent symptoms or want to improve the cosmetic appearance of the varicose veins. These treatment options include:  Sclerotherapy. A solution is injected into the vein to close it off.  Laser treatment. A laser is used to heat the vein to close it off.  Radiofrequency vein ablation. An electrical current produced by radio waves is used to close off the vein.  Phlebectomy. The vein is surgically removed through small incisions made over the varicose vein.  Vein ligation and stripping. The vein is surgically removed through incisions made over the varicose vein after the vein has been tied (ligated). HOME CARE INSTRUCTIONS  Do not stand or sit in one position for long periods of time. Do not sit with your legs crossed. Rest with your legs raised during the day.  Wear compression stockings as directed by your health care provider. These stockings help to prevent blood clots and reduce swelling in your legs.  Do not wear other tight, encircling garments around your legs, pelvis, or waist.  Walk as much as possible to increase blood flow.  Raise the foot of your bed at night with 2-inch blocks.  If you get a cut in the skin over the vein and the vein bleeds, lie down with your leg raised and press on it with a clean cloth until the bleeding stops. Then place a bandage (dressing) on the cut. See your health care provider if it continues to bleed. SEEK MEDICAL CARE  IF:  The skin around your ankle starts to break down.  You have pain, redness, tenderness, or hard swelling in your leg over a vein.  You are uncomfortable because of leg pain.   This information is not intended to replace advice given to you by your health care provider. Make sure you discuss any questions you have with your health care provider.   Document Released: 07/10/2005 Document Revised: 10/21/2014 Document Reviewed: 02/15/2014 Elsevier Interactive Patient Education [employer].

## 2016-08-05 ENCOUNTER — Inpatient Hospital Stay (HOSPITAL_COMMUNITY)
Admission: EM | Admit: 2016-08-05 | Discharge: 2016-08-07 | DRG: 872 | Disposition: A | Discharge status: Home / Self Care | Payer: Self-pay | Attending: Internal Medicine | Admitting: Internal Medicine

## 2016-08-05 ENCOUNTER — Ambulatory Visit (HOSPITAL_COMMUNITY): Admit: 2016-08-05 | Discharge: 2016-08-05 | Disposition: A | Discharge status: Home / Self Care | Payer: Self-pay

## 2016-08-05 ENCOUNTER — Emergency Department (HOSPITAL_COMMUNITY): Payer: Self-pay

## 2016-08-05 ENCOUNTER — Encounter (HOSPITAL_COMMUNITY): Payer: Self-pay

## 2016-08-05 DIAGNOSIS — E119 Type 2 diabetes mellitus without complications: Secondary | ICD-10-CM

## 2016-08-05 DIAGNOSIS — E871 Hypo-osmolality and hyponatremia: Secondary | ICD-10-CM | POA: Diagnosis present

## 2016-08-05 DIAGNOSIS — Z79899 Other long term (current) drug therapy: Secondary | ICD-10-CM

## 2016-08-05 DIAGNOSIS — M79609 Pain in unspecified limb: Secondary | ICD-10-CM

## 2016-08-05 DIAGNOSIS — Z8249 Family history of ischemic heart disease and other diseases of the circulatory system: Secondary | ICD-10-CM

## 2016-08-05 DIAGNOSIS — Z7982 Long term (current) use of aspirin: Secondary | ICD-10-CM

## 2016-08-05 DIAGNOSIS — E876 Hypokalemia: Secondary | ICD-10-CM | POA: Diagnosis present

## 2016-08-05 DIAGNOSIS — R202 Paresthesia of skin: Secondary | ICD-10-CM | POA: Diagnosis present

## 2016-08-05 DIAGNOSIS — E11628 Type 2 diabetes mellitus with other skin complications: Secondary | ICD-10-CM | POA: Diagnosis present

## 2016-08-05 DIAGNOSIS — E860 Dehydration: Secondary | ICD-10-CM | POA: Diagnosis present

## 2016-08-05 DIAGNOSIS — Z6836 Body mass index (BMI) 36.0-36.9, adult: Secondary | ICD-10-CM

## 2016-08-05 DIAGNOSIS — B379 Candidiasis, unspecified: Secondary | ICD-10-CM | POA: Diagnosis present

## 2016-08-05 DIAGNOSIS — L03115 Cellulitis of right lower limb: Secondary | ICD-10-CM | POA: Diagnosis present

## 2016-08-05 DIAGNOSIS — F17200 Nicotine dependence, unspecified, uncomplicated: Secondary | ICD-10-CM | POA: Diagnosis present

## 2016-08-05 DIAGNOSIS — F17210 Nicotine dependence, cigarettes, uncomplicated: Secondary | ICD-10-CM | POA: Diagnosis present

## 2016-08-05 DIAGNOSIS — I878 Other specified disorders of veins: Secondary | ICD-10-CM | POA: Diagnosis present

## 2016-08-05 DIAGNOSIS — Z794 Long term (current) use of insulin: Secondary | ICD-10-CM

## 2016-08-05 DIAGNOSIS — E669 Obesity, unspecified: Secondary | ICD-10-CM | POA: Diagnosis present

## 2016-08-05 DIAGNOSIS — E1165 Type 2 diabetes mellitus with hyperglycemia: Secondary | ICD-10-CM | POA: Diagnosis present

## 2016-08-05 DIAGNOSIS — A419 Sepsis, unspecified organism: Principal | ICD-10-CM | POA: Diagnosis present

## 2016-08-05 DIAGNOSIS — IMO0002 Reserved for concepts with insufficient information to code with codable children: Secondary | ICD-10-CM | POA: Diagnosis present

## 2016-08-05 DIAGNOSIS — M7989 Other specified soft tissue disorders: Secondary | ICD-10-CM

## 2016-08-05 DIAGNOSIS — Z9114 Patient's other noncompliance with medication regimen: Secondary | ICD-10-CM

## 2016-08-05 DIAGNOSIS — E785 Hyperlipidemia, unspecified: Secondary | ICD-10-CM | POA: Diagnosis present

## 2016-08-05 DIAGNOSIS — R590 Localized enlarged lymph nodes: Secondary | ICD-10-CM | POA: Diagnosis present

## 2016-08-05 LAB — URINALYSIS, ROUTINE W REFLEX MICROSCOPIC
Glucose, UA: >1000 mg/dL — AB
Hgb urine dipstick: NEGATIVE
Ketones, ur: NEGATIVE mg/dL
Leukocytes, UA: NEGATIVE
Nitrite: NEGATIVE
Protein, ur: >300 mg/dL — AB
Specific Gravity, Urine: 1.044 — ABNORMAL HIGH (ref 1.005–1.030)
pH: 6.5 (ref 5.0–8.0)

## 2016-08-05 LAB — HEPATIC FUNCTION PANEL
ALT: 17 U/L (ref 17–63)
AST: 13 U/L — ABNORMAL LOW (ref 15–41)
Albumin: 3.9 g/dL (ref 3.5–5.0)
Alkaline Phosphatase: 72 U/L (ref 38–126)
Bilirubin, Direct: 0.1 mg/dL (ref 0.1–0.5)
Indirect Bilirubin: 0.3 mg/dL (ref 0.3–0.9)
Total Bilirubin: 0.4 mg/dL (ref 0.3–1.2)
Total Protein: 7 g/dL (ref 6.5–8.1)

## 2016-08-05 LAB — BASIC METABOLIC PANEL
Anion gap: 9 (ref 5–15)
BUN: 12 mg/dL (ref 6–20)
CALCIUM: 8.9 mg/dL (ref 8.9–10.3)
CO2: 26 mmol/L (ref 22–32)
CREATININE: 0.73 mg/dL (ref 0.61–1.24)
Chloride: 98 mmol/L — ABNORMAL LOW (ref 101–111)
GFR calc non Af Amer: >60 mL/min (ref >60)
Glucose, Bld: 267 mg/dL — ABNORMAL HIGH (ref 65–99)
Potassium: 3.7 mmol/L (ref 3.5–5.1)
SODIUM: 133 mmol/L — AB (ref 135–145)

## 2016-08-05 LAB — DIFFERENTIAL
BASOS ABS: 0 10*3/uL (ref 0.0–0.1)
Basophils Relative: 0 %
EOS ABS: 0 10*3/uL (ref 0.0–0.7)
EOS PCT: 0 %
LYMPHS ABS: 1 10*3/uL (ref 0.7–4.0)
LYMPHS PCT: 6 %
Monocytes Absolute: 1 10*3/uL (ref 0.1–1.0)
Monocytes Relative: 6 %
NEUTROS PCT: 88 %
Neutro Abs: 14.5 10*3/uL — ABNORMAL HIGH (ref 1.7–7.7)

## 2016-08-05 LAB — CBC
HCT: 42.3 % (ref 39.0–52.0)
Hemoglobin: 14.6 g/dL (ref 13.0–17.0)
MCH: 29.7 pg (ref 26.0–34.0)
MCHC: 34.5 g/dL (ref 30.0–36.0)
MCV: 86 fL (ref 78.0–100.0)
PLATELETS: 221 10*3/uL (ref 150–400)
RBC: 4.92 MIL/uL (ref 4.22–5.81)
RDW: 13.9 % (ref 11.5–15.5)
WBC: 16.5 10*3/uL — ABNORMAL HIGH (ref 4.0–10.5)

## 2016-08-05 LAB — URINE MICROSCOPIC-ADD ON

## 2016-08-05 LAB — GLUCOSE, CAPILLARY
GLUCOSE-CAPILLARY: 224 mg/dL — AB (ref 65–99)
GLUCOSE-CAPILLARY: 195 mg/dL — AB (ref 65–99)

## 2016-08-05 LAB — CBG MONITORING, ED: Glucose-Capillary: 243 mg/dL — ABNORMAL HIGH (ref 65–99)

## 2016-08-05 LAB — TSH: TSH: 1.292 u[IU]/mL (ref 0.350–4.500)

## 2016-08-05 LAB — I-STAT TROPONIN, ED: TROPONIN I, POC: 0.01 ng/mL (ref 0.00–0.08)

## 2016-08-05 LAB — I-STAT CG4 LACTIC ACID, ED: Lactic Acid, Venous: 1.55 mmol/L (ref 0.5–1.9)

## 2016-08-05 MED ORDER — SODIUM CHLORIDE 0.9 % IV BOLUS (SEPSIS)
1000.0000 mL | Freq: Once | INTRAVENOUS | Status: AC
  Administered 2016-08-05: 1000 mL via INTRAVENOUS

## 2016-08-05 MED ORDER — ACETAMINOPHEN 650 MG RE SUPP: 650.0000 mg | Freq: Four times a day (QID) | RECTAL | Status: DC | PRN

## 2016-08-05 MED ORDER — MORPHINE SULFATE (PF) 4 MG/ML IV SOLN
4.0000 mg | Freq: Once | INTRAVENOUS | Status: AC
  Administered 2016-08-05: 4 mg via INTRAVENOUS
  Filled 2016-08-05: qty 1

## 2016-08-05 MED ORDER — CEFTRIAXONE SODIUM 1 G IJ SOLR
1.0000 g | INTRAMUSCULAR | Status: DC
  Administered 2016-08-06: 1 g via INTRAVENOUS
  Filled 2016-08-05 (×2): qty 10

## 2016-08-05 MED ORDER — SODIUM CHLORIDE 0.9 % IV SOLN
INTRAVENOUS | Status: DC
  Administered 2016-08-05 – 2016-08-06 (×2): via INTRAVENOUS

## 2016-08-05 MED ORDER — INSULIN ASPART 100 UNIT/ML ~~LOC~~ SOLN
0.0000 [IU] | Freq: Every day | SUBCUTANEOUS | Status: DC
  Administered 2016-08-06: 5 [IU] via SUBCUTANEOUS
  Administered 2016-08-07: 3 [IU] via SUBCUTANEOUS

## 2016-08-05 MED ORDER — METFORMIN HCL 500 MG PO TABS
1000.0000 mg | ORAL_TABLET | Freq: Two times a day (BID) | ORAL | Status: DC
  Administered 2016-08-05 – 2016-08-07 (×4): 1000 mg via ORAL
  Filled 2016-08-05 (×4): qty 2

## 2016-08-05 MED ORDER — ACETAMINOPHEN 325 MG PO TABS
650.0000 mg | ORAL_TABLET | Freq: Once | ORAL | Status: AC
  Administered 2016-08-05: 650 mg via ORAL
  Filled 2016-08-05: qty 2

## 2016-08-05 MED ORDER — SENNOSIDES-DOCUSATE SODIUM 8.6-50 MG PO TABS: 1.0000 | ORAL_TABLET | Freq: Every evening | ORAL | Status: DC | PRN

## 2016-08-05 MED ORDER — ACETAMINOPHEN 325 MG PO TABS
650.0000 mg | ORAL_TABLET | Freq: Four times a day (QID) | ORAL | Status: DC | PRN
  Administered 2016-08-05: 650 mg via ORAL
  Filled 2016-08-05: qty 2

## 2016-08-05 MED ORDER — AZITHROMYCIN 500 MG IV SOLR
500.0000 mg | Freq: Once | INTRAVENOUS | Status: AC
  Administered 2016-08-05: 500 mg via INTRAVENOUS
  Filled 2016-08-05: qty 500

## 2016-08-05 MED ORDER — ONDANSETRON HCL 4 MG/2ML IJ SOLN: 4.0000 mg | Freq: Four times a day (QID) | INTRAMUSCULAR | Status: DC | PRN

## 2016-08-05 MED ORDER — ENOXAPARIN SODIUM 40 MG/0.4ML ~~LOC~~ SOLN
40.0000 mg | SUBCUTANEOUS | Status: DC
  Administered 2016-08-05 – 2016-08-06 (×2): 40 mg via SUBCUTANEOUS
  Filled 2016-08-05 (×3): qty 0.4

## 2016-08-05 MED ORDER — MORPHINE SULFATE (PF) 2 MG/ML IV SOLN
2.0000 mg | INTRAVENOUS | Status: DC | PRN
  Administered 2016-08-05 – 2016-08-07 (×7): 2 mg via INTRAVENOUS
  Filled 2016-08-05 (×8): qty 1

## 2016-08-05 MED ORDER — INSULIN ASPART PROT & ASPART (70-30 MIX) 100 UNIT/ML ~~LOC~~ SUSP
20.0000 [IU] | Freq: Two times a day (BID) | SUBCUTANEOUS | Status: DC
  Administered 2016-08-05 – 2016-08-06 (×3): 20 [IU] via SUBCUTANEOUS
  Filled 2016-08-05: qty 10

## 2016-08-05 MED ORDER — NYSTATIN 100000 UNIT/ML MT SUSP
5.0000 mL | Freq: Four times a day (QID) | OROMUCOSAL | Status: DC
  Administered 2016-08-05 – 2016-08-07 (×7): 500000 [IU] via ORAL
  Filled 2016-08-05 (×7): qty 5

## 2016-08-05 MED ORDER — IBUPROFEN 200 MG PO TABS
600.0000 mg | ORAL_TABLET | Freq: Four times a day (QID) | ORAL | Status: DC
  Administered 2016-08-05 – 2016-08-07 (×7): 600 mg via ORAL
  Filled 2016-08-05 (×7): qty 3

## 2016-08-05 MED ORDER — TRAMADOL HCL 50 MG PO TABS
100.0000 mg | ORAL_TABLET | Freq: Four times a day (QID) | ORAL | Status: DC | PRN
  Administered 2016-08-05 – 2016-08-06 (×3): 100 mg via ORAL
  Filled 2016-08-05 (×3): qty 2

## 2016-08-05 MED ORDER — ONDANSETRON HCL 4 MG PO TABS: 4.0000 mg | ORAL_TABLET | Freq: Four times a day (QID) | ORAL | Status: DC | PRN

## 2016-08-05 MED ORDER — DEXTROSE 5 % IV SOLN
1.0000 g | Freq: Once | INTRAVENOUS | Status: AC
  Administered 2016-08-05: 1 g via INTRAVENOUS
  Filled 2016-08-05: qty 10

## 2016-08-05 MED ORDER — DEXTROSE 5 % IV SOLN
500.0000 mg | INTRAVENOUS | Status: DC
  Filled 2016-08-05: qty 500

## 2016-08-05 NOTE — Progress Notes (Signed)
EDCM spoke to patient at bedside. Patient confirms she does not have a pcp or insurance living in [place].  [facility] provided patient with contact information to [facility], informed patient of services there and walk in times.  EDCM also provided patient with list of pcps who accept self pay patients, list of discount pharmacies and websites needymeds.org and GoodRX.com for medication assistance, phone number to inquire about the orange card, phone number to inquire about [employer], phone number to inquire about the [place], financial resources in the community such as local churches, [employer], urban ministries, and dental assistance for uninsured patients.  Patient thankful for resources.  No further EDCM needs at this time.

## 2016-08-05 NOTE — ED Triage Notes (Signed)
PT RECEIVED FROM WORK VIA EMS C/O RIGHT LEG SWELLING, WEEPING PUS, AND FEVER X2 DAYS. PT ALSO STS MID-STERNAL CHEST PRESSURE RADIATING TO THE EPIGASTRIC AREA TODAY WITH NAUSEA. PT HAS A HX OF DIABETES, AND STS HE HAS ALSO BEEN URINATING FREQUENTLY, BUT DENIES DYSURIA.

## 2016-08-05 NOTE — H&P (Addendum)
History and Physical    Cody Harper [id number] DOB: 12-02-1965 DOA: 08/05/2016    PCP: No PCP Per Patient  Patient coming from: home  Chief Complaint: pain in right leg  HPI: Cody Harper is a 50 y.o. male with medical history significant of DM2, smoker, chronic venous stasis of right leg which now always stays swollen. He presents with increased swelling and pain in right leg for a couple of days. Currently pain is 10/10. Morphine helps it improve. Movement/ touch make it worse. Clear/white colored fluid and some blood has been oozing out of the inner leg (area of medial malleolus). He has had chills & sweats since yesterday.  Also admits to 2 days of dry cough & mild central chest pain worse with coughing. No dyspnea No sore throat, runny nose earaches. Dizzy all day yesterday. Urinating a lot especially since yesterday.   ED Course:  WBC 16.5, Na 133, Cr 98, Glucose 267, fever 102.4 - Urine: + for bilirubin, > 1000 glucose, > 300 protein, sp gravity 1.044 Venous duplex shows b/l enlarged inguinal lymph nodes the largest being > 5 cm. No DVT.   Review of Systems:  Nausea- no vomiting Toes of right foot numb for months  All other systems reviewed and apart from HPI, are negative.  Past Medical History:  Diagnosis Date  . Cellulitis of right leg 11/12/2014  . Diabetes mellitus   . Diabetes type 2, uncontrolled ([place]) 03/20/2011  . Diabetic foot ulcer ([place]) 03/20/2011  . Elevated TSH 11/13/2014  . Obesity, unspecified 05/31/2014  . Other and unspecified hyperlipidemia 05/31/2014  . Superficial vein thrombosis 06/01/2014  . Syncope 05/30/2014  . Tendinitis of elbow or forearm 11/13/2014  . Tobacco use disorder 05/31/2014  . 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 Vein, varicose 03/20/2011    Past Surgical History:  Procedure Laterality Date  . APPENDECTOMY      Social History:   reports that he has been smoking Cigarettes.  He has a 30.00  pack-year smoking history. He has never used smokeless tobacco. He reports that he does not drink alcohol or use drugs.  Smoking about 1/2ppd.  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 Married.   No Known Allergies  Family History  Problem Relation Age of Onset  . Cancer Mother     breast cancer   . Hypertension Mother   . Hypertension Father      Prior to Admission medications   Medication Sig Start Date End Date Taking? Authorizing Provider  aspirin EC 81 MG tablet Take 1 tablet (81 mg total) by mouth daily. Patient taking differently: Take 81 mg by mouth daily as needed for moderate pain.  06/01/14  Yes Delfina Redwood, MD  buPROPion [facility] SR) 150 MG 12 hr tablet Take 1 tablet (150 mg total) by mouth 2 (two) times daily. 12/05/14  Yes Lorayne Marek, MD  metFORMIN (GLUCOPHAGE) 1000 MG tablet Take 1 tablet (1,000 mg total) by mouth 2 (two) times daily with a meal. 11/13/14  Yes Venetia Maxon Rama, MD  acetaminophen (TYLENOL) 325 MG tablet Take 2 tablets (650 mg total) by mouth every 6 (six) hours as needed for mild pain. Patient not taking: Reported on 08/05/2016 06/01/14   Delfina Redwood, MD  clotrimazole-betamethasone (LOTRISONE) cream Apply 1 application topically 2 (two) times daily. Patient not taking: Reported on 08/05/2016 12/05/14   Lorayne Marek, MD  gabapentin (NEURONTIN) 100 MG capsule Take 1  capsule (100 mg total) by mouth 3 (three) times daily. Patient not taking: Reported on 08/05/2016 12/05/14   Lorayne Marek, MD  Insulin Glargine (LANTUS) 100 UNIT/ML Solostar Pen Inject 15 Units into the skin daily at 10 pm. Patient not taking: Reported on 08/05/2016 12/05/14   Lorayne Marek, MD  pravastatin (PRAVACHOL) 20 MG tablet Take 1 tablet (20 mg total) by mouth daily. Patient not taking: Reported on 08/05/2016 12/05/14   Lorayne Marek, MD    Physical Exam: Vitals:   08/05/16 1202 08/05/16 1234 08/05/16 1355 08/05/16 1406  BP: 134/72   125/78  Pulse: 87   82  Resp:  20   20  Temp: 99.9 F (37.7 C) 102.4 F (39.1 C) 100.1 F (37.8 C)   TempSrc: Oral Rectal Oral   SpO2: 95%   95%  Weight: 132 kg (291 lb)     Height: 6\' 3"  (1.905 m)         Constitutional: NAD, calm, comfortable Eyes: PERTLA, lids and conjunctivae normal ENMT: Mucous membranes are moist. Posterior pharynx clear of any exudate or lesions. Normal dentition.  Neck: normal, supple, no masses, no thyromegaly Respiratory: clear to auscultation bilaterally, no wheezing, no crackles. Normal respiratory effort. No accessory muscle use.  Cardiovascular: S1 & S2 heard, regular rate and rhythm, no murmurs / rubs / gallops. No extremity edema. 2+ pedal pulses. No carotid bruits.  Abdomen: No distension, no tenderness, no masses palpated. No hepatosplenomegaly. Bowel sounds normal.  Musculoskeletal: no clubbing / cyanosis. No joint deformity upper and lower extremities. Good ROM, no contractures. Normal muscle tone.  Skin: no rashes, lesions, ulcers. No induration Neurologic: CN 2-12 grossly intact. Sensation intact, DTR normal. Strength 5/5 in all 4 limbs.  Psychiatric: Normal judgment and insight. Alert and oriented x 3. Normal mood.     Labs on Admission: I have personally reviewed following labs and imaging studies  CBC:  Recent Labs Lab 08/05/16 1216  WBC 16.5*  NEUTROABS 14.5*  HGB 14.6  HCT 42.3  MCV 86.0  PLT [id number]   Basic Metabolic Panel:  Recent Labs Lab 08/05/16 1216  NA 133*  K 3.7  CL 98*  CO2 26  GLUCOSE 267*  BUN 12  CREATININE 0.73  CALCIUM 8.9   GFR: Estimated Creatinine Clearance: 159.9 mL/min (by C-G formula based on SCr of 0.73 mg/dL). Liver Function Tests:  Recent Labs Lab 08/05/16 1216  AST 13*  ALT 17  ALKPHOS 72  BILITOT 0.4  PROT 7.0  ALBUMIN 3.9   No results for input(s): LIPASE, AMYLASE in the last 168 hours. No results for input(s): AMMONIA in the last 168 hours. Coagulation Profile: No results for input(s): INR, PROTIME in the  last 168 hours. Cardiac Enzymes: No results for input(s): CKTOTAL, CKMB, CKMBINDEX, TROPONINI in the last 168 hours. BNP (last 3 results) No results for input(s): PROBNP in the last 8760 hours. HbA1C: No results for input(s): HGBA1C in the last 72 hours. CBG: No results for input(s): GLUCAP in the last 168 hours. Lipid Profile: No results for input(s): CHOL, HDL, LDLCALC, TRIG, CHOLHDL, LDLDIRECT in the last 72 hours. Thyroid Function Tests: No results for input(s): TSH, T4TOTAL, FREET4, T3FREE, THYROIDAB in the last 72 hours. Anemia Panel: No results for input(s): VITAMINB12, FOLATE, FERRITIN, TIBC, IRON, RETICCTPCT in the last 72 hours. Urine analysis:    Component Value Date/Time   COLORURINE AMBER (A) 08/05/2016 1248   APPEARANCEUR CLEAR 08/05/2016 1248   LABSPEC 1.044 (H) 08/05/2016 1248   PHURINE 6.5  08/05/2016 1248   GLUCOSEU >1000 (A) 08/05/2016 1248   HGBUR NEGATIVE 08/05/2016 1248   BILIRUBINUR SMALL (A) 08/05/2016 1248   KETONESUR NEGATIVE 08/05/2016 1248   PROTEINUR >300 (A) 08/05/2016 1248   UROBILINOGEN 0.2 04/18/2015 1434   NITRITE NEGATIVE 08/05/2016 1248   LEUKOCYTESUR NEGATIVE 08/05/2016 1248   Sepsis Labs: @LABRCNTIP (procalcitonin:4,lacticidven:4) )No results found for this or any previous visit (from the past 240 hour(s)).   Radiological Exams on Admission: Dg Chest 2 View  Result Date: 08/05/2016 CLINICAL DATA:  Leg swelling. EXAM: CHEST  2 VIEW COMPARISON:  02/11/2015. FINDINGS: Mediastinum hilar structures normal. Heart size normal. Mild right base subsegmental atelectasis. No pleural effusion or pneumothorax. No acute bony abnormality. IMPRESSION: Mild right base subsegmental atelectasis. Electronically Signed   By: Marcello Moores  Register   On: 08/05/2016 13:04    EKG: Independently reviewed. SR at 87 bpm  Assessment/Plan Principal Problem:   Cellulitis of right leg- chronic venous stasis of leg - Rocephin- elevate leg - never had and surgery or DVT  in that leg- will need TEDS on discharge  Active Problems: CAP- R LL - Rocephin and Zithromax - not hypoxic- Tessalon for cough  Sepsis- fever 102, leukocytosis in setting of above infections - getting 3 L NS- LA normal  Hyponatremia - dehydrated- cont slow NS infusion after boluses    Diabetes type 2, uncontrolled - A1c 11.8 in 1/16 - has run out of medications and has not been back to see a doctor - check A1c - will start 70/30 as he has no insurance and [place] is currently not taking new patient's or giving samples- ? 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    Tobacco use disorder - counseled to quit- states he does not need nicotine patch   Trush - Nystatin   DVT prophylaxis: Lovenox  Code Status: Full code  Family Communication:   Disposition Plan: admit to med/surg  Consults called: none  Admission status: inpatient    [facility] MD [facility] Pager: [email or web address] Password [email or web address] 7PM-7AM, please contact night-coverage   08/05/2016, 3:40 PM

## 2016-08-05 NOTE — ED Notes (Signed)
WILL TRANSPORT PT TO 3W 1328-1. AAOX4. PT IN NO APPARENT DISTRESS. THE OPPORTUNITY TO ASK QUESTIONS WAS PROVIDED.

## 2016-08-05 NOTE — ED Provider Notes (Signed)
[place] DEPT Provider Note   CSN: [id number] Arrival date & time: 08/05/16  1137     History   Chief Complaint Chief Complaint  Patient presents with  . Chest Pain  . Leg Swelling    RIGHT    HPI Cody Harper is a 50 y.o. male with a past medical history of diabetes, varicose veins, who presents to the ED today complaining of right leg pain and fever. Patient states that he has chronic pain in his right leg due to venous stasis and varicose veins. However over the last 2 days he has had increasing pain in his posterior calf now that radiates up to his groin. He also states he can feel a "knot" on his posterior thigh that has not been there before. Patient has also been running a fever since yesterday. He has not tried taking anything for his symptoms. He also reports a nonproductive cough for the last 2 days. He feels short of breath when lying flat because he states that it makes him feel his chest cough more. Patient also reports episodic, substernal chest pain that occurred today while he was at rest. Chest pain lasted for a few seconds and then resolved. Patient denies vomiting, diarrhea, sore throat. No sick contacts. Patient reports home blood sugars have been within normal limits. He reports compliance with his medications.  HPI  Past Medical History:  Diagnosis Date  . Cellulitis of right leg 11/12/2014  . Diabetes mellitus   . Diabetes type 2, uncontrolled ([place]) 03/20/2011  . Diabetic foot ulcer ([place]) 03/20/2011  . Elevated TSH 11/13/2014  . Obesity, unspecified 05/31/2014  . Other and unspecified hyperlipidemia 05/31/2014  . Superficial vein thrombosis 06/01/2014  . Syncope 05/30/2014  . Tendinitis of elbow or forearm 11/13/2014  . Tobacco use disorder 05/31/2014  . 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 Vein, varicose 03/20/2011    Patient Active Problem List   Diagnosis Date Noted  . Elevated TSH 11/13/2014  . Tendinitis of elbow or  forearm 11/13/2014  . Cellulitis of right leg 11/12/2014  . Right leg pain 05/31/2014  . Other and unspecified hyperlipidemia 05/31/2014  . Obesity, unspecified 05/31/2014  . Tobacco use disorder 05/31/2014  . Diabetes type 2, uncontrolled ([place]) 03/20/2011  . Vein, varicose 03/20/2011    Past Surgical History:  Procedure Laterality Date  . APPENDECTOMY         Home Medications    Prior to Admission medications   Medication Sig Start Date End Date Taking? Authorizing Provider  aspirin EC 81 MG tablet Take 1 tablet (81 mg total) by mouth daily. Patient taking differently: Take 81 mg by mouth daily as needed for moderate pain.  06/01/14  Yes Delfina Redwood, MD  buPROPion [facility] SR) 150 MG 12 hr tablet Take 1 tablet (150 mg total) by mouth 2 (two) times daily. 12/05/14  Yes Lorayne Marek, MD  metFORMIN (GLUCOPHAGE) 1000 MG tablet Take 1 tablet (1,000 mg total) by mouth 2 (two) times daily with a meal. 11/13/14  Yes Venetia Maxon Rama, MD  acetaminophen (TYLENOL) 325 MG tablet Take 2 tablets (650 mg total) by mouth every 6 (six) hours as needed for mild pain. Patient not taking: Reported on 08/05/2016 06/01/14   Delfina Redwood, MD  clotrimazole-betamethasone (LOTRISONE) cream Apply 1 application topically 2 (two) times daily. Patient not taking: Reported on 08/05/2016 12/05/14   Lorayne Marek, MD  gabapentin (NEURONTIN) 100 MG capsule Take 1  capsule (100 mg total) by mouth 3 (three) times daily. Patient not taking: Reported on 08/05/2016 12/05/14   Lorayne Marek, MD  Insulin Glargine (LANTUS) 100 UNIT/ML Solostar Pen Inject 15 Units into the skin daily at 10 pm. Patient not taking: Reported on 08/05/2016 12/05/14   Lorayne Marek, MD  pravastatin (PRAVACHOL) 20 MG tablet Take 1 tablet (20 mg total) by mouth daily. Patient not taking: Reported on 08/05/2016 12/05/14   Lorayne Marek, MD    Family History Family History  Problem Relation Age of Onset  . Cancer Mother     breast  cancer   . Hypertension Mother   . Hypertension Father     Social History Social History  Substance Use Topics  . Smoking status: Current Every Day Smoker    Packs/day: 1.00    Years: 30.00    Types: Cigarettes  . Smokeless tobacco: Never Used  . Alcohol use No     Comment: occassionally     Allergies   Review of patient's allergies indicates no known allergies.   Review of Systems Review of Systems  All other systems reviewed and are negative.    Physical Exam Updated Vital Signs BP 134/72 (BP Location: Right Arm)   Pulse 87   Temp 102.4 F (39.1 C) (Rectal)   Resp 20   Ht 6\' 3"  (1.905 m)   Wt 132 kg   SpO2 95%   BMI 36.37 kg/m   Physical Exam  Constitutional: He is oriented to person, place, and time. He appears well-developed and well-nourished. No distress.  HENT:  Head: Normocephalic and atraumatic.  Mouth/Throat: No oropharyngeal exudate.  Eyes: Conjunctivae and EOM are normal. Pupils are equal, round, and reactive to light. Right eye exhibits no discharge. Left eye exhibits no discharge. No scleral icterus.  Cardiovascular: Normal rate, regular rhythm, normal heart sounds and intact distal pulses.  Exam reveals no gallop and no friction rub.   No murmur heard. Pulmonary/Chest: Effort normal and breath sounds normal. No respiratory distress. He has no wheezes. He has no rales. He exhibits no tenderness.  Abdominal: Soft. He exhibits no distension. There is no tenderness. There is no guarding.  Genitourinary:  Genitourinary Comments: R inguinal lymphadenopathy and TTP  Musculoskeletal: Normal range of motion. He exhibits edema.  Neurological: He is alert and oriented to person, place, and time.  Skin: Skin is warm and dry. No rash noted. He is not diaphoretic. No erythema. No pallor.  RLE swelling, venous stasis of calf/shin. Significant varicosities.  Psychiatric: He has a normal mood and affect. His behavior is normal.  Nursing note and vitals  reviewed.    ED Treatments / Results  Labs (all labs ordered are listed, but only abnormal results are displayed) Labs Reviewed  BASIC METABOLIC PANEL - Abnormal; Notable for the following:       Result Value   Sodium 133 (*)    Chloride 98 (*)    Glucose, Bld 267 (*)    All other components within normal limits  CBC - Abnormal; Notable for the following:    WBC 16.5 (*)    All other components within normal limits  URINALYSIS, ROUTINE W REFLEX MICROSCOPIC (NOT AT [facility]) - Abnormal; Notable for the following:    Color, Urine AMBER (*)    Specific Gravity, Urine 1.044 (*)    Glucose, UA >1000 (*)    Bilirubin Urine SMALL (*)    Protein, ur >300 (*)    All other components within normal limits  DIFFERENTIAL - Abnormal; Notable for the following:    Neutro Abs 14.5 (*)    All other components within normal limits  URINE MICROSCOPIC-ADD ON - Abnormal; Notable for the following:    Squamous Epithelial / LPF 0-5 (*)    Bacteria, UA RARE (*)    All other components within normal limits  CULTURE, BLOOD (ROUTINE X 2)  CULTURE, BLOOD (ROUTINE X 2)  URINE CULTURE  HEPATIC FUNCTION PANEL  I-STAT TROPOININ, ED  I-STAT CG4 LACTIC ACID, ED    EKG  EKG Interpretation  Date/Time:  Monday August 05 2016 11:52:54 EDT Ventricular Rate:  87 PR Interval:    QRS Duration: 106 QT Interval:  374 QTC Calculation: 450 R Axis:   85 Text Interpretation:  Sinus rhythm Probable left atrial enlargement RSR' in V1 or V2, right VCD or RVH No significant change since last tracing Confirmed by KNOTT MD, DANIEL [phone]) on 08/05/2016 12:09:47 PM       Radiology Dg Chest 2 View  Result Date: 08/05/2016 CLINICAL DATA:  Leg swelling. EXAM: CHEST  2 VIEW COMPARISON:  02/11/2015. FINDINGS: Mediastinum hilar structures normal. Heart size normal. Mild right base subsegmental atelectasis. No pleural effusion or pneumothorax. No acute bony abnormality. IMPRESSION: Mild right base subsegmental atelectasis.  Electronically Signed   By: Marcello Moores  Register   On: 08/05/2016 13:04    Procedures Procedures (including critical care time)  CRITICAL CARE Performed by: Carlos Levering   Total critical care time: 45 minutes  Critical care time was exclusive of separately billable procedures and treating other patients.  Critical care was necessary to treat or prevent imminent or life-threatening deterioration.  Critical care was time spent personally by me on the following activities: development of treatment plan with patient and/or surrogate as well as nursing, discussions with consultants, evaluation of patient's response to treatment, examination of patient, obtaining history from patient or surrogate, ordering and performing treatments and interventions, ordering and review of laboratory studies, ordering and review of radiographic studies, pulse oximetry and re-evaluation of patient's condition.   Medications Ordered in ED Medications  cefTRIAXone (ROCEPHIN) 1 g in dextrose 5 % 50 mL IVPB (not administered)  azithromycin (ZITHROMAX) 500 mg in dextrose 5 % 250 mL IVPB (not administered)  sodium chloride 0.9 % bolus 1,000 mL (not administered)  morphine 4 MG/ML injection 4 mg (4 mg Intravenous Given 08/05/16 1249)  sodium chloride 0.9 % bolus 1,000 mL (1,000 mLs Intravenous New Bag/Given 08/05/16 1249)  sodium chloride 0.9 % bolus 1,000 mL (1,000 mLs Intravenous New Bag/Given 08/05/16 1249)  acetaminophen (TYLENOL) tablet 650 mg (650 mg Oral Given 08/05/16 1249)     Initial Impression / Assessment and Plan / ED Course  I have reviewed the triage vital signs and the nursing notes.  Pertinent labs & imaging results that were available during my care of the patient were reviewed by me and considered in my medical decision making (see chart for details).  Clinical Course   50 y.o M presents to the ED today with multiple complaints nicluding, fever, RLE pain/swelling, cough. On  presentation to ED, pt appears uncomfortable but is in NAD. Rectal temp 102.4. Normotensive. Pt does have chronic venous stasis to RLE, does not appear to be cellulitic. CXR reveals mild right base atelectasis, favor PNA. Will cover with abx, rocephin and azithromycin. No lactic acidosis. Leukocytosis present. Pt is SIRS + with source of infection. Code sepsis called, and pt was adequately fluid resuscitated. Blood cultures obtained. DVT study obtained  due to ongoing RLE pain/swelling and new "knot" in R groin. No evidence of DVT, some of pts chronic varicosities are thrombosed. Pt does have large , 5-6cm, lymphnodes in R inguinal area. Pt also has smaller yet still enlarged L inguinal lymphadenopathy. This is likely reactive. Will consult hospitalist for admission.  Spoke with Dr. Wynelle Cleveland with hospitalist service who will admit to her service.   Final Clinical Impressions(s) / ED Diagnoses   Final diagnoses:  Sepsis, due to unspecified organism [facility])  Leg swelling    New Prescriptions New Prescriptions   No medications on file     Carlos Levering, PA-C 08/05/16 1543    Leo Grosser, MD 08/05/16 [phone]

## 2016-08-05 NOTE — ED Notes (Signed)
FIRST ATTEMPT TO CALL REPORT TO 1328-1.

## 2016-08-05 NOTE — ED Notes (Signed)
Bed: WA21 Expected date:  Expected time:  Means of arrival:  Comments: EMS  

## 2016-08-05 NOTE — Progress Notes (Signed)
*  Preliminary Results* Right lower extremity venous duplex completed. Visualized veins of the right lower extremity are negative for deep vein thrombosis. Some segments of varicose veins in the medial right calf exhibit acute thrombus. There is no evidence of right Baker's cyst.  Incidental finding: bilateral groins exhibit multiple heterogenous areas, largest being >5cm in the right groin. This is suggestive of possible enlarged inguinal lymph nodes.   Preliminary results discussed with Aldona Bar, PA.  08/05/2016 3:24 PM  Maudry Mayhew, BS, RVT, RDCS, RDMS

## 2016-08-06 LAB — CBC
HCT: 36.7 % — ABNORMAL LOW (ref 39.0–52.0)
Hemoglobin: 12.5 g/dL — ABNORMAL LOW (ref 13.0–17.0)
MCH: 29.5 pg (ref 26.0–34.0)
MCHC: 34.1 g/dL (ref 30.0–36.0)
MCV: 86.6 fL (ref 78.0–100.0)
Platelets: 167 10*3/uL (ref 150–400)
RBC: 4.24 MIL/uL (ref 4.22–5.81)
RDW: 14.2 % (ref 11.5–15.5)
WBC: 8.4 10*3/uL (ref 4.0–10.5)

## 2016-08-06 LAB — GLUCOSE, CAPILLARY
GLUCOSE-CAPILLARY: 216 mg/dL — AB (ref 65–99)
GLUCOSE-CAPILLARY: 146 mg/dL — AB (ref 65–99)
GLUCOSE-CAPILLARY: 147 mg/dL — AB (ref 65–99)
Glucose-Capillary: 231 mg/dL — ABNORMAL HIGH (ref 65–99)

## 2016-08-06 LAB — BASIC METABOLIC PANEL
Anion gap: 6 (ref 5–15)
BUN: 13 mg/dL (ref 6–20)
CALCIUM: 8.1 mg/dL — AB (ref 8.9–10.3)
CO2: 25 mmol/L (ref 22–32)
CREATININE: 0.71 mg/dL (ref 0.61–1.24)
Chloride: 104 mmol/L (ref 101–111)
GLUCOSE: 197 mg/dL — AB (ref 65–99)
Potassium: 3.3 mmol/L — ABNORMAL LOW (ref 3.5–5.1)
Sodium: 135 mmol/L (ref 135–145)

## 2016-08-06 LAB — URINE CULTURE: Culture: <10000 — AB

## 2016-08-06 LAB — MRSA PCR SCREENING: MRSA BY PCR: NEGATIVE

## 2016-08-06 MED ORDER — POTASSIUM CHLORIDE CRYS ER 20 MEQ PO TBCR
20.0000 meq | EXTENDED_RELEASE_TABLET | Freq: Once | ORAL | Status: AC
  Administered 2016-08-06: 20 meq via ORAL
  Filled 2016-08-06: qty 1

## 2016-08-06 MED ORDER — TRAZODONE HCL 50 MG PO TABS
100.0000 mg | ORAL_TABLET | Freq: Every evening | ORAL | Status: DC | PRN
  Administered 2016-08-06: 100 mg via ORAL
  Filled 2016-08-06: qty 2

## 2016-08-06 NOTE — Progress Notes (Signed)
PROGRESS NOTE  Cody Harper  [id number] DOB: Jun 08, 1966 DOA: 08/05/2016 PCP: No PCP  Brief Narrative: Cody Harper is a 50 yo male with a history of DM, tobacco use, and chronic venous insufficiency who presented 10/23 with right leg pain and swelling. This worsened over a couple days. He also noted fever, 2 days of nonproductive cough without dyspnea, and polyuria. He has diabetes but no outpatient provider or access to medications. On arrival he was noted to be febrile (102.83F) with a leukocytosis (WBC 16.5) and red, swollen, tender right lower leg. Venous duplex revealed inguinal lymphadenopathy and thrombosed varicose veins but no DVT. CXR showed subsegmental atelectasis. He was given antibiotics for cellulitis and pneumonia, IV fluids and admitted.    Assessment & Plan: Principal Problem:   Cellulitis of right leg Active Problems:   Diabetes type 2, uncontrolled (HCC)   Obesity   Tobacco use disorder   Venous stasis of both lower extremities   Sepsis ([place])  Sepsis due to right leg cellulitis: Predisposed due to poorly controlled DM and venous stasis. DVT ruled out. Leukocytosis resolved. - Rocephin - Elevate leg - Monitor blood cultures - s/p 3L NS bolus and more infusion since admission. D/C IVF, taking po.  Nonproductive cough: With no findings on exam and no significant infiltrate on CXR at admission, doubt pneumonia. No respiratory distress.  - Rocephin and Zithromax >> D/C azithromycin - Tessalon for cough - Incentive spirometry - Monitor  Uncontrolled T2DM: Out of medications without PCP. A1c 11.8 in 1/16  - CM consulted and have provided resources to get into [facility], and list of PCPs accepting self-pay patients and list of medication assistance resources including orange card.  - Start 70/30 insulin: 20u BID + SSI with lunch - Recheck HbA1c - Consult to diabetes coordinator - resume Metformin on d/c  Hyponatremia Resolved  Hypokalemia: With treatment  of hyperglycemia. Replete and recheck.   Right foot paresthesias: suspect related to swelling of leg and nerve compression rather than diabetic neuropathy - outpatient follow up recommended  Obesity: Body mass index is 36.37 kg/m. - Outpatient follow up recommended  Tobacco use disorder - Brief cessation counseling provided - Outpatient follow up - Declined nicotine patch   Thrush in setting of hyperglycemia: - Nystatin - Check HIV  DVT prophylaxis: Lovenox Code Status: Full Family Communication: None at bedside, declined offer to call. Disposition Plan: Admit for inpatient management of sepsis due to cellulitis and diabetes management.   Consultants:   Care management  Diabetes coordinator  Procedures:  LE doppler U/S 10/23: Visualized veins of the right lower extremity are negative for deep vein thrombosis. Some segments of varicose veins in the medial right calf exhibit acute thrombus. There is no evidence of right Baker&'s cyst.  Incidental finding: bilateral groins exhibit multiple heterogenous areas, largest being >5cm in the right groin. This is suggestive of possible enlarged inguinal lymph nodes.  Antimicrobials:  Rocephin (10/23 >> )  Azithromycin (10/23)   Subjective: Pt feels slightly improved generally from admission. States redness is darker but not spreading. No fever, +chills.   Objective: Vitals:   08/05/16 1706 08/05/16 1729 08/05/16 2209 08/06/16 0627  BP: 121/73 115/74 (!) 115/56 115/72  Pulse: 83 79 75 69  Resp: 22  20 20   Temp:   98.3 F (36.8 C) 98.1 F (36.7 C)  TempSrc:   Oral Oral  SpO2: 95% 96% 96% 97%  Weight:      Height:  Intake/Output Summary (Last 24 hours) at 08/06/16 1317 Last data filed at 08/06/16 0934  Gross per 24 hour  Intake             4617 ml  Output                0 ml  Net             4617 ml   Filed Weights   08/05/16 1202  Weight: 132 kg (291 lb)    Examination: General exam: Obese 50 y.o.  male in no distress Respiratory system: Non-labored breathing. Clear to auscultation bilaterally.  Cardiovascular system: Regular rate and rhythm. No murmur, rub, or gallop. No JVD, and no pedal edema. Gastrointestinal system: Abdomen soft, non-tender, non-distended, with normoactive bowel sounds. No organomegaly or masses felt. Central nervous system: Alert and oriented. No focal neurological deficits. Extremities: Indistinct erythematous right lower leg nearly circumferential above ankle, most medially. Diffuse 2+ pitting edema. Multiple varicosities throughout. LLE aith slightly less swelling and no erythema.  Lymph: Bilateral shotty lymphadenopathy with couple dominant nodes in R > L inguinal area Psychiatry: Judgement and insight appear normal. Mood & affect appropriate.   Data Reviewed: I have personally reviewed following labs and imaging studies  CBC:  Recent Labs Lab 08/05/16 1216 08/06/16 0352  WBC 16.5* 8.4  NEUTROABS 14.5*  --   HGB 14.6 12.5*  HCT 42.3 36.7*  MCV 86.0 86.6  PLT 221 [id number]   Basic Metabolic Panel:  Recent Labs Lab 08/05/16 1216 08/06/16 0352  NA 133* 135  K 3.7 3.3*  CL 98* 104  CO2 26 25  GLUCOSE 267* 197*  BUN 12 13  CREATININE 0.73 0.71  CALCIUM 8.9 8.1*   GFR: Estimated Creatinine Clearance: 159.9 mL/min (by C-G formula based on SCr of 0.71 mg/dL). Liver Function Tests:  Recent Labs Lab 08/05/16 1216  AST 13*  ALT 17  ALKPHOS 72  BILITOT 0.4  PROT 7.0  ALBUMIN 3.9   No results for input(s): LIPASE, AMYLASE in the last 168 hours. No results for input(s): AMMONIA in the last 168 hours. Coagulation Profile: No results for input(s): INR, PROTIME in the last 168 hours. Cardiac Enzymes: No results for input(s): CKTOTAL, CKMB, CKMBINDEX, TROPONINI in the last 168 hours. BNP (last 3 results) No results for input(s): PROBNP in the last 8760 hours. HbA1C: No results for input(s): HGBA1C in the last 72 hours. CBG:  Recent Labs Lab  08/05/16 1704 08/05/16 1811 08/05/16 2147 08/06/16 0751 08/06/16 1225  GLUCAP 243* 224* 195* 231* 216*   Lipid Profile: No results for input(s): CHOL, HDL, LDLCALC, TRIG, CHOLHDL, LDLDIRECT in the last 72 hours. Thyroid Function Tests:  Recent Labs  08/05/16 1611  TSH 1.292   Anemia Panel: No results for input(s): VITAMINB12, FOLATE, FERRITIN, TIBC, IRON, RETICCTPCT in the last 72 hours. Urine analysis:    Component Value Date/Time   COLORURINE AMBER (A) 08/05/2016 1248   APPEARANCEUR CLEAR 08/05/2016 1248   LABSPEC 1.044 (H) 08/05/2016 1248   PHURINE 6.5 08/05/2016 1248   GLUCOSEU >1000 (A) 08/05/2016 1248   HGBUR NEGATIVE 08/05/2016 1248   BILIRUBINUR SMALL (A) 08/05/2016 1248   KETONESUR NEGATIVE 08/05/2016 1248   PROTEINUR >300 (A) 08/05/2016 1248   UROBILINOGEN 0.2 04/18/2015 1434   NITRITE NEGATIVE 08/05/2016 1248   LEUKOCYTESUR NEGATIVE 08/05/2016 1248   Sepsis Labs: @LABRCNTIP (procalcitonin:4,lacticidven:4)  ) Recent Results (from the past 240 hour(s))  Culture, blood (routine x 2)     Status: None (Preliminary  result)   Collection Time: 08/05/16 12:16 PM  Result Value Ref Range Status   Specimen Description BLOOD LEFT ANTECUBITAL  Final   Special Requests BOTTLES DRAWN AEROBIC AND ANAEROBIC 5CC  Final   Culture   Final    NO GROWTH < 24 HOURS Performed at [facility]    Report Status PENDING  Incomplete  Urine culture     Status: Abnormal   Collection Time: 08/05/16 12:48 PM  Result Value Ref Range Status   Specimen Description URINE, RANDOM  Final   Special Requests NONE  Final   Culture (A)  Final    <10,000 COLONIES/mL INSIGNIFICANT GROWTH Performed at [facility]    Report Status 08/06/2016 FINAL  Final  Culture, blood (routine x 2)     Status: None (Preliminary result)   Collection Time: 08/05/16  1:44 PM  Result Value Ref Range Status   Specimen Description BLOOD RIGHT ANTECUBITAL  Final   Special Requests BOTTLES DRAWN  AEROBIC AND ANAEROBIC 5CC  Final   Culture   Final    NO GROWTH < 24 HOURS Performed at [facility]    Report Status PENDING  Incomplete  MRSA PCR Screening     Status: None   Collection Time: 08/06/16 12:11 AM  Result Value Ref Range Status   MRSA by PCR NEGATIVE NEGATIVE Final    Comment:        The GeneXpert MRSA Assay (FDA approved for NASAL specimens only), is one component of a comprehensive MRSA colonization surveillance program. It is not intended to diagnose MRSA infection nor to guide or monitor treatment for MRSA infections.      Radiology Studies: Dg Chest 2 View  Result Date: 08/05/2016 CLINICAL DATA:  Leg swelling. EXAM: CHEST  2 VIEW COMPARISON:  02/11/2015. FINDINGS: Mediastinum hilar structures normal. Heart size normal. Mild right base subsegmental atelectasis. No pleural effusion or pneumothorax. No acute bony abnormality. IMPRESSION: Mild right base subsegmental atelectasis. Electronically Signed   By: [place]   On: 08/05/2016 13:04    Scheduled Meds: . azithromycin  500 mg Intravenous Q24H  . cefTRIAXone (ROCEPHIN)  IV  1 g Intravenous Q24H  . enoxaparin (LOVENOX) injection  40 mg Subcutaneous Q24H  . ibuprofen  600 mg Oral QID  . insulin aspart  0-15 Units Subcutaneous QAC lunch  . insulin aspart protamine- aspart  20 Units Subcutaneous BID WC  . metFORMIN  1,000 mg Oral BID WC  . nystatin  5 mL Oral QID   Continuous Infusions: . sodium chloride 75 mL/hr at 08/06/16 0831     LOS: 1 day   Time spent: 25 minutes.  Vance Gather, MD [facility] Pager [phone]  If 7PM-7AM, please contact night-coverage [email or web address] Password [email or web address] 08/06/2016, 1:17 PM

## 2016-08-06 NOTE — Progress Notes (Signed)
Inpatient Diabetes Program Recommendations  AACE/ADA: New Consensus Statement on Inpatient Glycemic Control (2015)  Target Ranges:  Prepandial:   less than 140 mg/dL      Peak postprandial:   less than 180 mg/dL (1-2 hours)      Critically ill patients:  140 - 180 mg/dL   Lab Results  Component Value Date   GLUCAP 216 (H) 08/06/2016   HGBA1C 11.8 (H) 11/12/2014    Review of Glycemic Control  Diabetes history: DM2 Outpatient Diabetes medications: metformin 1000 mg bid Current orders for Inpatient glycemic control: 70/30 20 units bid, Novolog 0-15 units tidwc, metformin 1000 mg bid  HgbA1C pending. Has been on insulin in the past. Has meter and supplies at home to monitor blood sugars.  Inpatient Diabetes Program Recommendations:    Increase 70/30 to 30 units bid. Add HS correction.  Spoke with pt regarding his diabetes and importance of controlling blood sugars to prevent long-term complications. Pt states he has been on insulin in the past, lost over 100 pounds and got off. Has not been checking blood sugars on a regular basis. Pt does not have insurance, therefore will need affordable insulin at discharge. Stressed importance of finding PCP to manage his DM. Pt states wife is on 70/30 insulin and is currently hospitalized here at [facility]. Has received diabetes education in the past. Will order Living Well book and follow-up with him in am.   Thank you. Lorenda Peck, RD, LDN, CDE Inpatient Diabetes Coordinator [phone]

## 2016-08-07 DIAGNOSIS — I878 Other specified disorders of veins: Secondary | ICD-10-CM

## 2016-08-07 DIAGNOSIS — F17200 Nicotine dependence, unspecified, uncomplicated: Secondary | ICD-10-CM

## 2016-08-07 DIAGNOSIS — L03115 Cellulitis of right lower limb: Secondary | ICD-10-CM

## 2016-08-07 LAB — BASIC METABOLIC PANEL
Anion gap: 5 (ref 5–15)
BUN: 9 mg/dL (ref 6–20)
CHLORIDE: 105 mmol/L (ref 101–111)
CO2: 28 mmol/L (ref 22–32)
CREATININE: 0.69 mg/dL (ref 0.61–1.24)
Calcium: 8.6 mg/dL — ABNORMAL LOW (ref 8.9–10.3)
GFR calc Af Amer: >60 mL/min (ref >60)
GFR calc non Af Amer: >60 mL/min (ref >60)
GLUCOSE: 124 mg/dL — AB (ref 65–99)
Potassium: 3.9 mmol/L (ref 3.5–5.1)
Sodium: 138 mmol/L (ref 135–145)

## 2016-08-07 LAB — CBC
HCT: 35.2 % — ABNORMAL LOW (ref 39.0–52.0)
Hemoglobin: 11.8 g/dL — ABNORMAL LOW (ref 13.0–17.0)
MCH: 29 pg (ref 26.0–34.0)
MCHC: 33.5 g/dL (ref 30.0–36.0)
MCV: 86.5 fL (ref 78.0–100.0)
Platelets: 181 10*3/uL (ref 150–400)
RBC: 4.07 MIL/uL — AB (ref 4.22–5.81)
RDW: 14.1 % (ref 11.5–15.5)
WBC: 7.7 10*3/uL (ref 4.0–10.5)

## 2016-08-07 LAB — GLUCOSE, CAPILLARY
GLUCOSE-CAPILLARY: 178 mg/dL — AB (ref 65–99)
Glucose-Capillary: 205 mg/dL — ABNORMAL HIGH (ref 65–99)

## 2016-08-07 LAB — HEMOGLOBIN A1C
Hgb A1c MFr Bld: 12.7 % — ABNORMAL HIGH (ref 4.8–5.6)
MEAN PLASMA GLUCOSE: 318 mg/dL

## 2016-08-07 MED ORDER — INSULIN ASPART PROT & ASPART (70-30 MIX) 100 UNIT/ML ~~LOC~~ SUSP
30.0000 [IU] | Freq: Two times a day (BID) | SUBCUTANEOUS | Status: DC
  Administered 2016-08-07: 30 [IU] via SUBCUTANEOUS

## 2016-08-07 MED ORDER — INSULIN ASPART PROT & ASPART (70-30 MIX) 100 UNIT/ML ~~LOC~~ SUSP: 30.0000 [IU] | Freq: Two times a day (BID) | SUBCUTANEOUS | 0 refills | Status: DC

## 2016-08-07 MED ORDER — DOXYCYCLINE HYCLATE 100 MG PO CAPS: 100.0000 mg | ORAL_CAPSULE | Freq: Two times a day (BID) | ORAL | 0 refills | Status: DC

## 2016-08-07 NOTE — Discharge Summary (Signed)
Physician Discharge Summary  Cody Harper [id number] DOB: Jan 06, 1966 DOA: 08/05/2016  PCP: No PCP Per Patient  Admit date: 08/05/2016 Discharge date: 08/07/2016  Admitted From: Home Disposition:  Home  Recommendations for Outpatient Follow-up:  1. Follow up with PCP in 1-2 weeks 2. Please titrate insulin as appropriate   Discharge Condition:Improved CODE STATUS:Full Diet recommendation: Diabetic   Brief/Interim Summary: 80 yomalewith a history of DM, tobacco use, and chronic venous insufficiency who presented 10/23 with right leg pain and swelling. This worsened over a couple days. He also noted fever, 2 days of nonproductive cough without dyspnea, and polyuria. He has diabetes but no outpatient provider or access to medications. On arrival he was noted to be febrile (102.25F) with a leukocytosis (WBC 16.5) and red, swollen, tender right lower leg. Venous duplex revealed inguinal lymphadenopathy and thrombosed varicose veins but no DVT. CXR showed subsegmental atelectasis. He was given antibiotics for cellulitis and pneumonia, IV fluids and admitted.  Sepsis due to right leg cellulitis: Predisposed due to poorly controlled DM and venous stasis. DVT ruled out. Leukocytosis resolved. - Patient was continued on Rocephin - At time of discharge, patient has been transitioned to doxycycline to complete course of treatment  Nonproductive cough: With no findings on exam and no significant infiltrate on CXR at admission, doubt pneumonia. No respiratory distress.  -Azithromycin was discontinued -Patient is stable through the remainder of hospital course  Uncontrolled T2DM: Patient reportedly was out of medications and without PCP. A1c 11.8 in 1/16. Repeat hemoglobin A1c noted to be 12.7 - Continued on 20units of 70/30 insulin, increased to 30 units for discharge - resume Metformin on d/c  Hyponatremia Resolved  Hypokalemia: With treatment of hyperglycemia. Replete and recheck.    Right foot paresthesias: suspect related to swelling of leg and nerve compression rather than diabetic neuropathy - outpatient follow up recommended  Obesity: Body mass index is 36.37 kg/m. - Outpatient follow up recommended  Tobacco use disorder - Brief cessation counseling provided - Outpatient follow up - Declined nicotine patch  Thrush in setting of hyperglycemia: - Nystatin - Check HIV  Discharge Diagnoses:  Principal Problem:   Cellulitis of right leg Active Problems:   Diabetes type 2, uncontrolled ([name])   Obesity   Tobacco use disorder   Venous stasis of both lower extremities   Sepsis ([name])    Discharge Instructions     Medication List    STOP taking these medications   acetaminophen 325 MG tablet Commonly known as:  TYLENOL   clotrimazole-betamethasone cream Commonly known as:  LOTRISONE   Insulin Glargine 100 UNIT/ML Solostar Pen Commonly known as:  LANTUS     TAKE these medications   aspirin EC 81 MG tablet Take 1 tablet (81 mg total) by mouth daily. What changed:  when to take this  reasons to take this   buPROPion 150 MG 12 hr tablet Commonly known as:  WELLBUTRIN SR Take 1 tablet (150 mg total) by mouth 2 (two) times daily.   doxycycline 100 MG capsule Commonly known as:  VIBRAMYCIN Take 1 capsule (100 mg total) by mouth 2 (two) times daily.   gabapentin 100 MG capsule Commonly known as:  NEURONTIN Take 1 capsule (100 mg total) by mouth 3 (three) times daily.   insulin aspart protamine- aspart (70-30) 100 UNIT/ML injection Commonly known as:  NOVOLOG MIX 70/30 Inject 0.3 mLs (30 Units total) into the skin 2 (two) times daily with a meal.   metFORMIN 1000 MG tablet Commonly known  as:  GLUCOPHAGE Take 1 tablet (1,000 mg total) by mouth 2 (two) times daily with a meal.   pravastatin 20 MG tablet Commonly known as:  PRAVACHOL Take 1 tablet (20 mg total) by mouth daily.      Follow-up Information    [place] Go on 08/15/2016.   Why:  Appointment is at [facility] photo ID, $20 copay and hospital discharge paperwork. Contact information: [place] [phone]         No Known Allergies   Procedures/Studies: Dg Chest 2 View  Result Date: 08/05/2016 CLINICAL DATA:  Leg swelling. EXAM: CHEST  2 VIEW COMPARISON:  02/11/2015. FINDINGS: Mediastinum hilar structures normal. Heart size normal. Mild right base subsegmental atelectasis. No pleural effusion or pneumothorax. No acute bony abnormality. IMPRESSION: Mild right base subsegmental atelectasis. Electronically Signed   By: Marcello Moores  Register   On: 08/05/2016 13:04    Subjective: Eager to go home  Discharge Exam: Vitals:   08/06/16 2048 08/07/16 0540  BP: 128/85 122/82  Pulse: 63 63  Resp: 20 20  Temp: 98.5 F (36.9 C) 97.4 F (36.3 C)   Vitals:   08/06/16 1255 08/06/16 2025 08/06/16 2048 08/07/16 0540  BP: 118/72 138/71 128/85 122/82  Pulse: 66 96 63 63  Resp: 16 16 20 20   Temp: 98.5 F (36.9 C) 97.5 F (36.4 C) 98.5 F (36.9 C) 97.4 F (36.3 C)  TempSrc: Oral Oral Oral Oral  SpO2: 98% 100% 100% 97%  Weight:      Height:        General: Pt is alert, awake, not in acute distress Cardiovascular: RRR, S1/S2 +, no rubs, no gallops Respiratory: CTA bilaterally, no wheezing, no rhonchi Abdominal: Soft, NT, ND, bowel sounds + Extremities: B LE edema, RLE with improving cellulitis   The results of significant diagnostics from this hospitalization (including imaging, microbiology, ancillary and laboratory) are listed below for reference.     Microbiology: Recent Results (from the past 240 hour(s))  Culture, blood (routine x 2)     Status: None (Preliminary result)   Collection Time: 08/05/16 12:16 PM  Result Value Ref Range Status   Specimen Description BLOOD LEFT ANTECUBITAL  Final   Special Requests BOTTLES DRAWN AEROBIC AND ANAEROBIC 5CC   Final   Culture   Final    NO GROWTH 2 DAYS Performed at [facility]    Report Status PENDING  Incomplete  Urine culture     Status: Abnormal   Collection Time: 08/05/16 12:48 PM  Result Value Ref Range Status   Specimen Description URINE, RANDOM  Final   Special Requests NONE  Final   Culture (A)  Final    <10,000 COLONIES/mL INSIGNIFICANT GROWTH Performed at [facility]    Report Status 08/06/2016 FINAL  Final  Culture, blood (routine x 2)     Status: None (Preliminary result)   Collection Time: 08/05/16  1:44 PM  Result Value Ref Range Status   Specimen Description BLOOD RIGHT ANTECUBITAL  Final   Special Requests BOTTLES DRAWN AEROBIC AND ANAEROBIC 5CC  Final   Culture   Final    NO GROWTH 2 DAYS Performed at [facility]    Report Status PENDING  Incomplete  MRSA PCR Screening     Status: None   Collection Time: 08/06/16 12:11 AM  Result Value Ref Range Status   MRSA by PCR NEGATIVE NEGATIVE Final    Comment:  The GeneXpert MRSA Assay (FDA approved for NASAL specimens only), is one component of a comprehensive MRSA colonization surveillance program. It is not intended to diagnose MRSA infection nor to guide or monitor treatment for MRSA infections.      Labs: BNP (last 3 results) No results for input(s): BNP in the last 8760 hours. Basic Metabolic Panel:  Recent Labs Lab 08/05/16 1216 08/06/16 0352 08/07/16 0404  NA 133* 135 138  K 3.7 3.3* 3.9  CL 98* 104 105  CO2 26 25 28   GLUCOSE 267* 197* 124*  BUN 12 13 9   CREATININE 0.73 0.71 0.69  CALCIUM 8.9 8.1* 8.6*   Liver Function Tests:  Recent Labs Lab 08/05/16 1216  AST 13*  ALT 17  ALKPHOS 72  BILITOT 0.4  PROT 7.0  ALBUMIN 3.9   No results for input(s): LIPASE, AMYLASE in the last 168 hours. No results for input(s): AMMONIA in the last 168 hours. CBC:  Recent Labs Lab 08/05/16 1216 08/06/16 0352 08/07/16 0404  WBC 16.5* 8.4 7.7  NEUTROABS 14.5*  --    --   HGB 14.6 12.5* 11.8*  HCT 42.3 36.7* 35.2*  MCV 86.0 86.6 86.5  PLT 221 167 181   Cardiac Enzymes: No results for input(s): CKTOTAL, CKMB, CKMBINDEX, TROPONINI in the last 168 hours. BNP: Invalid input(s): POCBNP CBG:  Recent Labs Lab 08/06/16 1225 08/06/16 1755 08/06/16 2041 08/07/16 0750 08/07/16 1131  GLUCAP 216* 146* 147* 205* 178*   D-Dimer No results for input(s): DDIMER in the last 72 hours. Hgb A1c  Recent Labs  08/06/16 0352  HGBA1C 12.7*   Lipid Profile No results for input(s): CHOL, HDL, LDLCALC, TRIG, CHOLHDL, LDLDIRECT in the last 72 hours. Thyroid function studies  Recent Labs  08/05/16 1611  TSH 1.292   Anemia work up No results for input(s): VITAMINB12, FOLATE, FERRITIN, TIBC, IRON, RETICCTPCT in the last 72 hours. Urinalysis    Component Value Date/Time   COLORURINE AMBER (A) 08/05/2016 1248   APPEARANCEUR CLEAR 08/05/2016 1248   LABSPEC 1.044 (H) 08/05/2016 1248   PHURINE 6.5 08/05/2016 1248   GLUCOSEU >1000 (A) 08/05/2016 1248   HGBUR NEGATIVE 08/05/2016 1248   BILIRUBINUR SMALL (A) 08/05/2016 1248   KETONESUR NEGATIVE 08/05/2016 1248   PROTEINUR >300 (A) 08/05/2016 1248   UROBILINOGEN 0.2 04/18/2015 1434   NITRITE NEGATIVE 08/05/2016 1248   LEUKOCYTESUR NEGATIVE 08/05/2016 1248   Sepsis Labs Invalid input(s): PROCALCITONIN,  WBC,  LACTICIDVEN Microbiology Recent Results (from the past 240 hour(s))  Culture, blood (routine x 2)     Status: None (Preliminary result)   Collection Time: 08/05/16 12:16 PM  Result Value Ref Range Status   Specimen Description BLOOD LEFT ANTECUBITAL  Final   Special Requests BOTTLES DRAWN AEROBIC AND ANAEROBIC 5CC  Final   Culture   Final    NO GROWTH 2 DAYS Performed at [facility]    Report Status PENDING  Incomplete  Urine culture     Status: Abnormal   Collection Time: 08/05/16 12:48 PM  Result Value Ref Range Status   Specimen Description URINE, RANDOM  Final   Special Requests  NONE  Final   Culture (A)  Final    <10,000 COLONIES/mL INSIGNIFICANT GROWTH Performed at [facility]    Report Status 08/06/2016 FINAL  Final  Culture, blood (routine x 2)     Status: None (Preliminary result)   Collection Time: 08/05/16  1:44 PM  Result Value Ref Range Status   Specimen Description  BLOOD RIGHT ANTECUBITAL  Final   Special Requests BOTTLES DRAWN AEROBIC AND ANAEROBIC 5CC  Final   Culture   Final    NO GROWTH 2 DAYS Performed at [facility]    Report Status PENDING  Incomplete  MRSA PCR Screening     Status: None   Collection Time: 08/06/16 12:11 AM  Result Value Ref Range Status   MRSA by PCR NEGATIVE NEGATIVE Final    Comment:        The GeneXpert MRSA Assay (FDA approved for NASAL specimens only), is one component of a comprehensive MRSA colonization surveillance program. It is not intended to diagnose MRSA infection nor to guide or monitor treatment for MRSA infections.      SIGNED:   Donne Hazel, MD  [facility] 08/07/2016, 7:18 PM  If 7PM-7AM, please contact night-coverage [email or web address] Password [email or web address]

## 2016-08-07 NOTE — Progress Notes (Signed)
Pt discharged home in stable condition. Discharge instructions and scripts given. Pt verbalized understanding. No immediate questions or concerns.

## 2016-08-07 NOTE — Progress Notes (Signed)
This CM called and made pt at f/u appointment at the [facility] and placed appointment on the AVS. Pt also informed of appointment and MD encouraged to choose an abx for pt to go home on that is on the $4 [facility] list. No other CM needs communicated. Marney Doctor RN,BSN,NCM [phone]

## 2016-08-08 LAB — HIV ANTIBODY (ROUTINE TESTING W REFLEX): HIV SCREEN 4TH GENERATION: NONREACTIVE

## 2016-08-10 LAB — CULTURE, BLOOD (ROUTINE X 2)
Culture: NO GROWTH
Culture: NO GROWTH

## 2016-08-15 ENCOUNTER — Inpatient Hospital Stay: Payer: Self-pay

## 2016-10-10 ENCOUNTER — Encounter (HOSPITAL_COMMUNITY): Payer: Self-pay | Admitting: *Deleted

## 2016-10-10 ENCOUNTER — Inpatient Hospital Stay (HOSPITAL_COMMUNITY)
Admission: EM | Admit: 2016-10-10 | Discharge: 2016-10-14 | DRG: 872 | Disposition: A | Discharge status: Home / Self Care | Payer: Self-pay | Attending: Internal Medicine | Admitting: Internal Medicine

## 2016-10-10 ENCOUNTER — Emergency Department (HOSPITAL_COMMUNITY): Payer: 59

## 2016-10-10 ENCOUNTER — Inpatient Hospital Stay (HOSPITAL_COMMUNITY): Payer: 59

## 2016-10-10 DIAGNOSIS — E11622 Type 2 diabetes mellitus with other skin ulcer: Secondary | ICD-10-CM | POA: Diagnosis present

## 2016-10-10 DIAGNOSIS — A419 Sepsis, unspecified organism: Principal | ICD-10-CM | POA: Diagnosis present

## 2016-10-10 DIAGNOSIS — Z8249 Family history of ischemic heart disease and other diseases of the circulatory system: Secondary | ICD-10-CM

## 2016-10-10 DIAGNOSIS — Z794 Long term (current) use of insulin: Secondary | ICD-10-CM

## 2016-10-10 DIAGNOSIS — F17210 Nicotine dependence, cigarettes, uncomplicated: Secondary | ICD-10-CM | POA: Diagnosis present

## 2016-10-10 DIAGNOSIS — E785 Hyperlipidemia, unspecified: Secondary | ICD-10-CM | POA: Diagnosis present

## 2016-10-10 DIAGNOSIS — IMO0002 Reserved for concepts with insufficient information to code with codable children: Secondary | ICD-10-CM | POA: Diagnosis present

## 2016-10-10 DIAGNOSIS — J111 Influenza due to unidentified influenza virus with other respiratory manifestations: Secondary | ICD-10-CM | POA: Diagnosis present

## 2016-10-10 DIAGNOSIS — Z7982 Long term (current) use of aspirin: Secondary | ICD-10-CM

## 2016-10-10 DIAGNOSIS — L03115 Cellulitis of right lower limb: Secondary | ICD-10-CM | POA: Diagnosis present

## 2016-10-10 DIAGNOSIS — L97211 Non-pressure chronic ulcer of right calf limited to breakdown of skin: Secondary | ICD-10-CM | POA: Diagnosis present

## 2016-10-10 DIAGNOSIS — E1140 Type 2 diabetes mellitus with diabetic neuropathy, unspecified: Secondary | ICD-10-CM | POA: Diagnosis present

## 2016-10-10 DIAGNOSIS — Z6838 Body mass index (BMI) 38.0-38.9, adult: Secondary | ICD-10-CM

## 2016-10-10 DIAGNOSIS — I4510 Unspecified right bundle-branch block: Secondary | ICD-10-CM | POA: Diagnosis present

## 2016-10-10 DIAGNOSIS — E669 Obesity, unspecified: Secondary | ICD-10-CM | POA: Diagnosis present

## 2016-10-10 DIAGNOSIS — M79606 Pain in leg, unspecified: Secondary | ICD-10-CM

## 2016-10-10 DIAGNOSIS — L0390 Cellulitis, unspecified: Secondary | ICD-10-CM | POA: Diagnosis present

## 2016-10-10 DIAGNOSIS — E872 Acidosis: Secondary | ICD-10-CM | POA: Diagnosis present

## 2016-10-10 DIAGNOSIS — F17200 Nicotine dependence, unspecified, uncomplicated: Secondary | ICD-10-CM | POA: Diagnosis present

## 2016-10-10 DIAGNOSIS — Z803 Family history of malignant neoplasm of breast: Secondary | ICD-10-CM

## 2016-10-10 DIAGNOSIS — E1165 Type 2 diabetes mellitus with hyperglycemia: Secondary | ICD-10-CM | POA: Diagnosis present

## 2016-10-10 HISTORY — DX: Pneumonia, unspecified organism: J18.9

## 2016-10-10 HISTORY — DX: Unspecified osteoarthritis, unspecified site: M19.90

## 2016-10-10 LAB — COMPREHENSIVE METABOLIC PANEL
ALBUMIN: 3 g/dL — AB (ref 3.5–5.0)
ALT: 13 U/L — AB (ref 17–63)
AST: 14 U/L — AB (ref 15–41)
Alkaline Phosphatase: 79 U/L (ref 38–126)
Anion gap: 11 (ref 5–15)
BUN: 13 mg/dL (ref 6–20)
CHLORIDE: 98 mmol/L — AB (ref 101–111)
CO2: 25 mmol/L (ref 22–32)
CREATININE: 0.97 mg/dL (ref 0.61–1.24)
Calcium: 8.5 mg/dL — ABNORMAL LOW (ref 8.9–10.3)
GFR calc Af Amer: >60 mL/min (ref >60)
GFR calc non Af Amer: >60 mL/min (ref >60)
GLUCOSE: 265 mg/dL — AB (ref 65–99)
POTASSIUM: 3.7 mmol/L (ref 3.5–5.1)
SODIUM: 134 mmol/L — AB (ref 135–145)
Total Bilirubin: 0.6 mg/dL (ref 0.3–1.2)
Total Protein: 6.8 g/dL (ref 6.5–8.1)

## 2016-10-10 LAB — CBC WITH DIFFERENTIAL/PLATELET
BASOS PCT: 0 %
Basophils Absolute: 0 10*3/uL (ref 0.0–0.1)
EOS ABS: 0 10*3/uL (ref 0.0–0.7)
EOS PCT: 0 %
HCT: 42.6 % (ref 39.0–52.0)
HEMOGLOBIN: 15.3 g/dL (ref 13.0–17.0)
LYMPHS ABS: 1.6 10*3/uL (ref 0.7–4.0)
Lymphocytes Relative: 10 %
MCH: 30.3 pg (ref 26.0–34.0)
MCHC: 35.9 g/dL (ref 30.0–36.0)
MCV: 84.4 fL (ref 78.0–100.0)
Monocytes Absolute: 1.1 10*3/uL — ABNORMAL HIGH (ref 0.1–1.0)
Monocytes Relative: 7 %
NEUTROS PCT: 83 %
Neutro Abs: 13.4 10*3/uL — ABNORMAL HIGH (ref 1.7–7.7)
PLATELETS: 232 10*3/uL (ref 150–400)
RBC: 5.05 MIL/uL (ref 4.22–5.81)
RDW: 13.8 % (ref 11.5–15.5)
WBC: 16.2 10*3/uL — AB (ref 4.0–10.5)

## 2016-10-10 LAB — I-STAT CG4 LACTIC ACID, ED
LACTIC ACID, VENOUS: 1.54 mmol/L (ref 0.5–1.9)
Lactic Acid, Venous: 3.4 mmol/L (ref 0.5–1.9)

## 2016-10-10 LAB — GLUCOSE, CAPILLARY: GLUCOSE-CAPILLARY: 227 mg/dL — AB (ref 65–99)

## 2016-10-10 LAB — CBG MONITORING, ED: GLUCOSE-CAPILLARY: 349 mg/dL — AB (ref 65–99)

## 2016-10-10 MED ORDER — SODIUM CHLORIDE 0.9 % IV BOLUS (SEPSIS)
1000.0000 mL | Freq: Once | INTRAVENOUS | Status: AC
  Administered 2016-10-10: 1000 mL via INTRAVENOUS

## 2016-10-10 MED ORDER — MORPHINE SULFATE (PF) 2 MG/ML IV SOLN
2.0000 mg | INTRAVENOUS | Status: DC | PRN
  Administered 2016-10-10 – 2016-10-13 (×10): 2 mg via INTRAVENOUS
  Filled 2016-10-10 (×13): qty 1

## 2016-10-10 MED ORDER — GABAPENTIN 100 MG PO CAPS
100.0000 mg | ORAL_CAPSULE | Freq: Three times a day (TID) | ORAL | Status: DC
  Administered 2016-10-10 – 2016-10-14 (×11): 100 mg via ORAL
  Filled 2016-10-10 (×11): qty 1

## 2016-10-10 MED ORDER — INSULIN ASPART PROT & ASPART (70-30 MIX) 100 UNIT/ML ~~LOC~~ SUSP
30.0000 [IU] | Freq: Two times a day (BID) | SUBCUTANEOUS | Status: DC
  Administered 2016-10-10 – 2016-10-14 (×8): 30 [IU] via SUBCUTANEOUS
  Filled 2016-10-10 (×2): qty 10

## 2016-10-10 MED ORDER — GADOBENATE DIMEGLUMINE 529 MG/ML IV SOLN
20.0000 mL | Freq: Once | INTRAVENOUS | Status: AC | PRN
  Administered 2016-10-10: 20 mL via INTRAVENOUS

## 2016-10-10 MED ORDER — PRAVASTATIN SODIUM 10 MG PO TABS
20.0000 mg | ORAL_TABLET | Freq: Every day | ORAL | Status: DC
  Administered 2016-10-10 – 2016-10-13 (×4): 20 mg via ORAL
  Filled 2016-10-10 (×4): qty 2

## 2016-10-10 MED ORDER — PIPERACILLIN-TAZOBACTAM 3.375 G IVPB 30 MIN
3.3750 g | Freq: Once | INTRAVENOUS | Status: AC
  Administered 2016-10-10: 3.375 g via INTRAVENOUS
  Filled 2016-10-10: qty 50

## 2016-10-10 MED ORDER — OXYCODONE HCL 5 MG PO TABS
5.0000 mg | ORAL_TABLET | Freq: Once | ORAL | Status: AC
  Administered 2016-10-10: 5 mg via ORAL
  Filled 2016-10-10: qty 1

## 2016-10-10 MED ORDER — BUPROPION HCL ER (SR) 150 MG PO TB12
150.0000 mg | ORAL_TABLET | Freq: Two times a day (BID) | ORAL | Status: DC
  Administered 2016-10-10 – 2016-10-14 (×8): 150 mg via ORAL
  Filled 2016-10-10 (×8): qty 1

## 2016-10-10 MED ORDER — ACETAMINOPHEN 650 MG RE SUPP: 650.0000 mg | Freq: Four times a day (QID) | RECTAL | Status: DC | PRN

## 2016-10-10 MED ORDER — SODIUM CHLORIDE 0.9 % IV SOLN
INTRAVENOUS | Status: DC
  Administered 2016-10-10 – 2016-10-11 (×2): via INTRAVENOUS

## 2016-10-10 MED ORDER — VANCOMYCIN HCL 10 G IV SOLR
2500.0000 mg | Freq: Once | INTRAVENOUS | Status: AC
  Administered 2016-10-10: 2500 mg via INTRAVENOUS
  Filled 2016-10-10: qty 2500

## 2016-10-10 MED ORDER — MORPHINE SULFATE (PF) 4 MG/ML IV SOLN
4.0000 mg | Freq: Once | INTRAVENOUS | Status: AC
  Administered 2016-10-10: 4 mg via INTRAVENOUS
  Filled 2016-10-10: qty 1

## 2016-10-10 MED ORDER — ONDANSETRON HCL 4 MG/2ML IJ SOLN: 4.0000 mg | Freq: Four times a day (QID) | INTRAMUSCULAR | Status: DC | PRN

## 2016-10-10 MED ORDER — VANCOMYCIN HCL IN DEXTROSE 1-5 GM/200ML-% IV SOLN: 1000.0000 mg | Freq: Once | INTRAVENOUS | Status: DC

## 2016-10-10 MED ORDER — ACETAMINOPHEN 325 MG PO TABS
650.0000 mg | ORAL_TABLET | Freq: Four times a day (QID) | ORAL | Status: DC | PRN
  Administered 2016-10-10 – 2016-10-11 (×4): 650 mg via ORAL
  Filled 2016-10-10 (×4): qty 2

## 2016-10-10 MED ORDER — INSULIN ASPART 100 UNIT/ML ~~LOC~~ SOLN
0.0000 [IU] | Freq: Three times a day (TID) | SUBCUTANEOUS | Status: DC
  Administered 2016-10-11 – 2016-10-12 (×4): 3 [IU] via SUBCUTANEOUS
  Administered 2016-10-12: 2 [IU] via SUBCUTANEOUS
  Administered 2016-10-12: 3 [IU] via SUBCUTANEOUS
  Administered 2016-10-13: 2 [IU] via SUBCUTANEOUS
  Administered 2016-10-13: 5 [IU] via SUBCUTANEOUS
  Administered 2016-10-13 – 2016-10-14 (×2): 3 [IU] via SUBCUTANEOUS

## 2016-10-10 MED ORDER — PIPERACILLIN-TAZOBACTAM 3.375 G IVPB
3.3750 g | Freq: Three times a day (TID) | INTRAVENOUS | Status: DC
  Administered 2016-10-10 – 2016-10-12 (×5): 3.375 g via INTRAVENOUS
  Filled 2016-10-10 (×6): qty 50

## 2016-10-10 MED ORDER — ONDANSETRON HCL 4 MG PO TABS: 4.0000 mg | ORAL_TABLET | Freq: Four times a day (QID) | ORAL | Status: DC | PRN

## 2016-10-10 MED ORDER — VANCOMYCIN HCL 10 G IV SOLR
1250.0000 mg | Freq: Two times a day (BID) | INTRAVENOUS | Status: DC
  Administered 2016-10-11 – 2016-10-14 (×7): 1250 mg via INTRAVENOUS
  Filled 2016-10-10 (×9): qty 1250

## 2016-10-10 MED ORDER — ENOXAPARIN SODIUM 40 MG/0.4ML ~~LOC~~ SOLN
40.0000 mg | SUBCUTANEOUS | Status: DC
  Administered 2016-10-10 – 2016-10-12 (×3): 40 mg via SUBCUTANEOUS
  Filled 2016-10-10 (×3): qty 0.4

## 2016-10-10 MED ORDER — ASPIRIN EC 81 MG PO TBEC
81.0000 mg | DELAYED_RELEASE_TABLET | Freq: Every day | ORAL | Status: DC
  Administered 2016-10-11 – 2016-10-14 (×4): 81 mg via ORAL
  Filled 2016-10-10 (×4): qty 1

## 2016-10-10 NOTE — ED Notes (Signed)
[facility] contacted to activate Code Sepsis 

## 2016-10-10 NOTE — ED Provider Notes (Signed)
[facility] DEPT Provider Note   CSN: [id number] Arrival date & time: 10/10/16  1305     History   Chief Complaint Chief Complaint  Patient presents with  . Fever  . Cough  . Wound Infection    right leg    HPI Cody Harper is a 50 y.o. male.  The history is provided by the patient. No language interpreter was used.  Fever   Associated symptoms include cough.  Cough    Cody Harper is a 50 y.o. male who presents to the Emergency Department complaining of fever, leg pain.  He reports 2 days of increased pain to his chronic right lower extremity wound with increased redness. He has subjective fevers at home. He also endorses a cough with increasing shortness of breath. He had similar symptoms previously and had an infection in his leg. The pain in his leg radiates from his distal leg up to his right groin.Symptoms are severe, constant, worsening. Past Medical History:  Diagnosis Date  . Cellulitis of right leg 11/12/2014  . Diabetes mellitus   . Diabetes type 2, uncontrolled ([place]) 03/20/2011  . Diabetic foot ulcer ([place]) 03/20/2011  . Elevated TSH 11/13/2014  . Obesity, unspecified 05/31/2014  . Other and unspecified hyperlipidemia 05/31/2014  . Superficial vein thrombosis 06/01/2014  . Syncope 05/30/2014  . Tendinitis of elbow or forearm 11/13/2014  . Tobacco use disorder 05/31/2014  . 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 Vein, varicose 03/20/2011    Patient Active Problem List   Diagnosis Date Noted  . Venous stasis of both lower extremities 08/05/2016  . Sepsis ([place]) 08/05/2016  . Elevated TSH 11/13/2014  . Tendinitis of elbow or forearm 11/13/2014  . Cellulitis of right leg 11/12/2014  . Right leg pain 05/31/2014  . Other and unspecified hyperlipidemia 05/31/2014  . Obesity 05/31/2014  . Tobacco use disorder 05/31/2014  . Diabetes type 2, uncontrolled ([place]) 03/20/2011  . Vein, varicose 03/20/2011    Past Surgical History:    Procedure Laterality Date  . APPENDECTOMY         Home Medications    Prior to Admission medications   Medication Sig Start Date End Date Taking? Authorizing Provider  aspirin EC 81 MG tablet Take 1 tablet (81 mg total) by mouth daily. Patient taking differently: Take 81 mg by mouth daily as needed for moderate pain.  06/01/14   Delfina Redwood, MD  buPROPion (WELLBUTRIN SR) 150 MG 12 hr tablet Take 1 tablet (150 mg total) by mouth 2 (two) times daily. 12/05/14   Lorayne Marek, MD  doxycycline (VIBRAMYCIN) 100 MG capsule Take 1 capsule (100 mg total) by mouth 2 (two) times daily. 08/07/16   Donne Hazel, MD  gabapentin (NEURONTIN) 100 MG capsule Take 1 capsule (100 mg total) by mouth 3 (three) times daily. Patient not taking: Reported on 08/05/2016 12/05/14   Lorayne Marek, MD  insulin aspart protamine- aspart (NOVOLOG MIX 70/30) (70-30) 100 UNIT/ML injection Inject 0.3 mLs (30 Units total) into the skin 2 (two) times daily with a meal. 08/07/16   Donne Hazel, MD  metFORMIN (GLUCOPHAGE) 1000 MG tablet Take 1 tablet (1,000 mg total) by mouth 2 (two) times daily with a meal. 11/13/14   Venetia Maxon Rama, MD  pravastatin (PRAVACHOL) 20 MG tablet Take 1 tablet (20 mg total) by mouth daily. Patient not taking: Reported on 08/05/2016 12/05/14   Lorayne Marek, MD    Family History Family  History  Problem Relation Age of Onset  . Cancer Mother     breast cancer   . Hypertension Mother   . Hypertension Father     Social History Social History  Substance Use Topics  . Smoking status: Current Every Day Smoker    Packs/day: 1.00    Years: 30.00    Types: Cigarettes  . Smokeless tobacco: Never Used  . Alcohol use No     Comment: occassionally     Allergies   Patient has no known allergies.   Review of Systems Review of Systems  Constitutional: Positive for fever.  Respiratory: Positive for cough.   All other systems reviewed and are negative.    Physical Exam Updated  Vital Signs BP 114/70 (BP Location: Right Arm)   Pulse 88   Temp 99.4 F (37.4 C) (Oral)   Resp 24   SpO2 97%   Physical Exam  Constitutional: He is oriented to person, place, and time. He appears well-developed and well-nourished.  HENT:  Head: Normocephalic and atraumatic.  Cardiovascular: Normal rate and regular rhythm.   No murmur heard. Pulmonary/Chest: Effort normal and breath sounds normal. No respiratory distress.  Abdominal: Soft. There is no tenderness. There is no rebound and no guarding.  Musculoskeletal:  2+ DP pulses bilaterally. Diffuse tenderness throughout the right thigh and calf. There are chronic erythematous changes to the right calf and shin. There is an ulcer over the distal right medial aspect of the shin with a small amount of purulent exudate.  Neurological: He is alert and oriented to person, place, and time.  Skin: Skin is warm. He is diaphoretic.  Psychiatric: He has a normal mood and affect. His behavior is normal.  Nursing note and vitals reviewed.    ED Treatments / Results  Labs (all labs ordered are listed, but only abnormal results are displayed) Labs Reviewed  CBC WITH DIFFERENTIAL/PLATELET - Abnormal; Notable for the following:       Result Value   WBC 16.2 (*)    Neutro Abs 13.4 (*)    Monocytes Absolute 1.1 (*)    All other components within normal limits  I-STAT CG4 LACTIC ACID, ED - Abnormal; Notable for the following:    Lactic Acid, Venous 3.40 (*)    All other components within normal limits  CBG MONITORING, ED - Abnormal; Notable for the following:    Glucose-Capillary 349 (*)    All other components within normal limits  CULTURE, BLOOD (ROUTINE X 2)  CULTURE, BLOOD (ROUTINE X 2)  URINE CULTURE  URINALYSIS, ROUTINE W REFLEX MICROSCOPIC  COMPREHENSIVE METABOLIC PANEL    EKG  EKG Interpretation None       Radiology Dg Chest 2 View  Result Date: 10/10/2016 CLINICAL DATA:  Right leg swelling infection. Dry cough and  fever. Headache and dizziness. EXAM: CHEST  2 VIEW COMPARISON:  Two-view chest x-ray 10/10/2016. FINDINGS: Low lung volumes exaggerate the heart size and pulmonary interstitium. No focal airspace disease present. There is no edema or effusion. IMPRESSION: No active cardiopulmonary disease. Electronically Signed   By: San Morelle M.D.   On: 10/10/2016 14:35    Procedures Procedures (including critical care time)  Medications Ordered in ED Medications  piperacillin-tazobactam (ZOSYN) IVPB 3.375 g (not administered)  vancomycin (VANCOCIN) IVPB 1000 mg/200 mL premix (not administered)  sodium chloride 0.9 % bolus 1,000 mL (not administered)     Initial Impression / Assessment and Plan / ED Course  I have reviewed the triage vital  signs and the nursing notes.  Pertinent labs & imaging results that were available during my care of the patient were reviewed by me and considered in my medical decision making (see chart for details).  Clinical Course     Patient with history of diabetes and prior sepsis related to DM and cellulitis of the right lower extremity here with increased pain and redness to the right leg. Examination is concerning for cellulitis with potentially infected diabetic ulcer. He was started on IV fluids and broad-spectrum antibiotics. His lactate cleared after IV fluid administration. Patient care transferred pending BMP.  Final Clinical Impressions(s) / ED Diagnoses   Final diagnoses:  Leg pain    New Prescriptions New Prescriptions   No medications on file     Quintella Reichert, MD 10/10/16 1729

## 2016-10-10 NOTE — ED Notes (Signed)
Critical lactic acid of 3.40 reported to Dr. Maryan Rued, will move pt to treatment room ASAP.

## 2016-10-10 NOTE — H&P (Signed)
History and Physical    Cody Harper [id number] DOB: [birth year] DOA: 10/10/2016  Referring MD/NP/PA: EDP PCP: No PCP Per Patient  Patient coming from: Home  Chief Complaint: fever/R leg pain and swelling  HPI: Cody Harper is a 50 y.o. 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 months presents to rthe ER with fevers, chills and R leg pain and swelling around the wound and some tenderness extending upto the thigh for the last 2 days. H/o prior h/o same  ED Course: found to have cellulitis, fevers, lactic acidosis  Review of Systems: As per HPI otherwise 10 point review of systems negative.   Past Medical History:  Diagnosis Date  . Cellulitis of right leg 11/12/2014  . Diabetes mellitus   . Diabetes type 2, uncontrolled ([place]) 03/20/2011  . Diabetic foot ulcer ([place]) 03/20/2011  . Elevated TSH 11/13/2014  . Obesity, unspecified 05/31/2014  . Other and unspecified hyperlipidemia 05/31/2014  . Superficial vein thrombosis 06/01/2014  . Syncope 05/30/2014  . Tendinitis of elbow or forearm 11/13/2014  . Tobacco use disorder 05/31/2014  . 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 Vein, varicose 03/20/2011    Past Surgical History:  Procedure Laterality Date  . APPENDECTOMY       reports that he has been smoking Cigarettes.  He has a 30.00 pack-year smoking history. He has never used smokeless tobacco. He reports that he does not drink alcohol or use drugs.  No Known Allergies  Family History  Problem Relation Age of Onset  . Cancer Mother     breast cancer   . Hypertension Mother   . Hypertension Father     Prior to Admission medications   Medication Sig Start Date End Date Taking? Authorizing Provider  aspirin EC 81 MG tablet Take 1 tablet (81 mg total) by mouth daily. Patient taking differently: Take 81 mg by mouth daily as needed for moderate pain.  06/01/14    Delfina Redwood, MD  buPROPion (WELLBUTRIN SR) 150 MG 12 hr tablet Take 1 tablet (150 mg total) by mouth 2 (two) times daily. 12/05/14   Lorayne Marek, MD  doxycycline (VIBRAMYCIN) 100 MG capsule Take 1 capsule (100 mg total) by mouth 2 (two) times daily. 08/07/16   Donne Hazel, MD  gabapentin (NEURONTIN) 100 MG capsule Take 1 capsule (100 mg total) by mouth 3 (three) times daily. Patient not taking: Reported on 08/05/2016 12/05/14   Lorayne Marek, MD  insulin aspart protamine- aspart (NOVOLOG MIX 70/30) (70-30) 100 UNIT/ML injection Inject 0.3 mLs (30 Units total) into the skin 2 (two) times daily with a meal. 08/07/16   Donne Hazel, MD  metFORMIN (GLUCOPHAGE) 1000 MG tablet Take 1 tablet (1,000 mg total) by mouth 2 (two) times daily with a meal. 11/13/14   Venetia Maxon Rama, MD  pravastatin (PRAVACHOL) 20 MG tablet Take 1 tablet (20 mg total) by mouth daily. Patient not taking: Reported on 08/05/2016 12/05/14   Lorayne Marek, MD    Physical Exam: Vitals:   10/10/16 1311 10/10/16 1643 10/10/16 1715 10/10/16 1730  BP: 114/70 133/75 148/84 104/85  Pulse: 88 86 86 84  Resp: 24 17 26 22   Temp: 99.4 F (37.4 C)     TempSrc: Oral     SpO2: 97% 99% 95% 95%      Constitutional: NAD, calm, comfortable Vitals:   10/10/16 1311 10/10/16  1643 10/10/16 1715 10/10/16 1730  BP: 114/70 133/75 148/84 104/85  Pulse: 88 86 86 84  Resp: 24 17 26 22   Temp: 99.4 F (37.4 C)     TempSrc: Oral     SpO2: 97% 99% 95% 95%   Eyes: PERRL, lids and conjunctivae normal ENMT: Mucous membranes are moist, .poor dentition.  Neck: normal, supple Respiratory: poor air movement, no wheezing, no crackles. Normal respiratory effort. No accessory muscle use.  Cardiovascular: Regular rate and rhythm, no murmurs / rubs / gallops. No extremity edema. 2+ pedal pulses. No carotid bruits.  Abdomen: no tenderness, no masses palpated. No hepatosplenomegaly. Bowel sounds positive.  Musculoskeletal: right lower leg with  open 2x5cm deep ulcer with dried secretions, surrounding erythema involving most of lower leg, diminished DP pulses Skin: ulcer as above Neurologic: CN 2-12 grossly intact. ,decreased sensations in lower legs DTR normal. Strength 5/5 in all 4.  Psychiatric: Normal judgment and insight. Alert and oriented x 3. Normal mood.  Labs on Admission: I have personally reviewed following labs and imaging studies  CBC:  Recent Labs Lab 10/10/16 1314  WBC 16.2*  NEUTROABS 13.4*  HGB 15.3  HCT 42.6  MCV 84.4  PLT [id number]   Basic Metabolic Panel:  Recent Labs Lab 10/10/16 1640  NA 134*  K 3.7  CL 98*  CO2 25  GLUCOSE 265*  BUN 13  CREATININE 0.97  CALCIUM 8.5*   GFR: CrCl cannot be calculated (Unknown ideal weight.). Liver Function Tests:  Recent Labs Lab 10/10/16 1640  AST 14*  ALT 13*  ALKPHOS 79  BILITOT 0.6  PROT 6.8  ALBUMIN 3.0*   No results for input(s): LIPASE, AMYLASE in the last 168 hours. No results for input(s): AMMONIA in the last 168 hours. Coagulation Profile: No results for input(s): INR, PROTIME in the last 168 hours. Cardiac Enzymes: No results for input(s): CKTOTAL, CKMB, CKMBINDEX, TROPONINI in the last 168 hours. BNP (last 3 results) No results for input(s): PROBNP in the last 8760 hours. HbA1C: No results for input(s): HGBA1C in the last 72 hours. CBG:  Recent Labs Lab 10/10/16 1319  GLUCAP 349*   Lipid Profile: No results for input(s): CHOL, HDL, LDLCALC, TRIG, CHOLHDL, LDLDIRECT in the last 72 hours. Thyroid Function Tests: No results for input(s): TSH, T4TOTAL, FREET4, T3FREE, THYROIDAB in the last 72 hours. Anemia Panel: No results for input(s): VITAMINB12, FOLATE, FERRITIN, TIBC, IRON, RETICCTPCT in the last 72 hours. Urine analysis:    Component Value Date/Time   COLORURINE AMBER (A) 08/05/2016 1248   APPEARANCEUR CLEAR 08/05/2016 1248   LABSPEC 1.044 (H) 08/05/2016 1248   PHURINE 6.5 08/05/2016 1248   GLUCOSEU >1000 (A)  08/05/2016 1248   HGBUR NEGATIVE 08/05/2016 1248   BILIRUBINUR SMALL (A) 08/05/2016 1248   KETONESUR NEGATIVE 08/05/2016 1248   PROTEINUR >300 (A) 08/05/2016 1248   UROBILINOGEN 0.2 04/18/2015 1434   NITRITE NEGATIVE 08/05/2016 1248   LEUKOCYTESUR NEGATIVE 08/05/2016 1248   Sepsis Labs: @LABRCNTIP (procalcitonin:4,lacticidven:4) )No results found for this or any previous visit (from the past 240 hour(s)).   Radiological Exams on Admission: Dg Chest 2 View  Result Date: 10/10/2016 CLINICAL DATA:  Right leg swelling infection. Dry cough and fever. Headache and dizziness. EXAM: CHEST  2 VIEW COMPARISON:  Two-view chest x-ray 10/10/2016. FINDINGS: Low lung volumes exaggerate the heart size and pulmonary interstitium. No focal airspace disease present. There is no edema or effusion. IMPRESSION: No active cardiopulmonary disease. Electronically Signed   By: Wynetta Fines.D.  On: 10/10/2016 14:35   Dg Tibia/fibula Right  Result Date: 10/10/2016 CLINICAL DATA:  Right lower leg pain. EXAM: RIGHT TIBIA AND FIBULA - 2 VIEW COMPARISON:  11/11/2014 FINDINGS: There is diffuse soft tissue swelling. Calcified phleboliths are identified along the medial aspect of the lower leg. No acute fractures or subluxations. IMPRESSION: 1. No acute bone abnormalities. 2. Soft tissue swelling. Electronically Signed   By: Kerby Moors M.D.   On: 10/10/2016 16:11   Assessment/Plan   Sepsis due to cellulitis -started Iv Vanc/since diabetic with chronic wound add Zosyn  -FU blood cx -check MRI r/o osteomyelitis-given chronicity -lactate cleared, IVF    Diabetes type 2, uncontrolled ([place]) -last hba1c was 12.7 -has no PCP/CM consult -resume Insulin 70/30, SSI    Obesity    Tobacco use disorder -counseled, nicotine patch   DM neuropathy -resume gabapentin  DVT prophylaxis: lovenox Code Status: Full code Family Communication: no family at bedside Disposition Plan: home in 2-3days  Admission  status: inpatient   Domenic Polite MD [facility] Pager [phone]  If 7PM-7AM, please contact night-coverage [email or web address] Password [email or web address]  10/10/2016, 6:01 PM

## 2016-10-10 NOTE — ED Notes (Signed)
Blood drawn for cultures at this time.

## 2016-10-10 NOTE — Progress Notes (Signed)
1845 Received pt from Ed, A&O x4. RLE swelling, an open wound noted to right lower leg. C/o pain, Temp 101.7. [facility] paged.

## 2016-10-10 NOTE — ED Triage Notes (Addendum)
PT states started having right wound infection on Tuesday to right leg and has spread up his leg.  Not had insulin since Tuesday.  Not felt well since and developed high fever and then developed a cough.  Pt is diaphoretic at triage. Pt was given Tylenol 1000mg  by EMS at 1245. No chest pain.  Pulse palpable in right lower extremity.  Complains of sob

## 2016-10-10 NOTE — Progress Notes (Signed)
Pharmacy Antibiotic Note  Cody Harper is a 50 y.o. male admitted on 10/10/2016 with cellulitis.  Pharmacy has been consulted for vancomycin and zosyn dosing. Tmax is 99.4 and WBC is elevated at 16.2. SCr is WNL. Lactic acid is improving from 3.4 to 1.54.   Plan: Vancomycin 2500mg  IV x 1 then 1250mg  IV Q12H Zosyn 3.375gm IV Q8H (4 hr inf) F/u renal fxn, C&S, clinical status and trough at SS     Temp (24hrs), Avg:99.4 F (37.4 C), Min:99.4 F (37.4 C), Max:99.4 F (37.4 C)   Recent Labs Lab 10/10/16 1314 10/10/16 1337 10/10/16 1640 10/10/16 1658  WBC 16.2*  --   --   --   CREATININE  --   --  0.97  --   LATICACIDVEN  --  3.40*  --  1.54    CrCl cannot be calculated (Unknown ideal weight.).    No Known Allergies  Antimicrobials this admission: Vanc 12/28>> Zosyn 12/28>>  Dose adjustments this admission: N/A  Microbiology results: Pending  Thank you for allowing pharmacy to be a part of this patient's care.  Kaye Luoma, Rande Lawman 10/10/2016 5:56 PM

## 2016-10-11 ENCOUNTER — Encounter (HOSPITAL_COMMUNITY): Payer: Self-pay | Admitting: General Practice

## 2016-10-11 LAB — RESPIRATORY PANEL BY PCR
Adenovirus: NOT DETECTED
Bordetella pertussis: NOT DETECTED
CHLAMYDOPHILA PNEUMONIAE-RVPPCR: NOT DETECTED
CORONAVIRUS OC43-RVPPCR: NOT DETECTED
Coronavirus 229E: NOT DETECTED
Coronavirus HKU1: NOT DETECTED
Coronavirus NL63: NOT DETECTED
INFLUENZA A H3-RVPPCR: DETECTED — AB
INFLUENZA B-RVPPCR: NOT DETECTED
MYCOPLASMA PNEUMONIAE-RVPPCR: NOT DETECTED
Metapneumovirus: NOT DETECTED
PARAINFLUENZA VIRUS 4-RVPPCR: NOT DETECTED
Parainfluenza Virus 1: NOT DETECTED
Parainfluenza Virus 2: NOT DETECTED
Parainfluenza Virus 3: NOT DETECTED
RESPIRATORY SYNCYTIAL VIRUS-RVPPCR: NOT DETECTED
RHINOVIRUS / ENTEROVIRUS - RVPPCR: NOT DETECTED

## 2016-10-11 LAB — CBC
HCT: 38.7 % — ABNORMAL LOW (ref 39.0–52.0)
Hemoglobin: 13.4 g/dL (ref 13.0–17.0)
MCH: 29.5 pg (ref 26.0–34.0)
MCHC: 34.6 g/dL (ref 30.0–36.0)
MCV: 85.1 fL (ref 78.0–100.0)
PLATELETS: 194 10*3/uL (ref 150–400)
RBC: 4.55 MIL/uL (ref 4.22–5.81)
RDW: 13.9 % (ref 11.5–15.5)
WBC: 12.4 10*3/uL — ABNORMAL HIGH (ref 4.0–10.5)

## 2016-10-11 LAB — HEMOGLOBIN A1C
HEMOGLOBIN A1C: 11.4 % — AB (ref 4.8–5.6)
MEAN PLASMA GLUCOSE: 280 mg/dL

## 2016-10-11 LAB — URINALYSIS, ROUTINE W REFLEX MICROSCOPIC
BACTERIA UA: NONE SEEN
Bilirubin Urine: NEGATIVE
Glucose, UA: 150 mg/dL — AB
HGB URINE DIPSTICK: NEGATIVE
Ketones, ur: 5 mg/dL — AB
Leukocytes, UA: NEGATIVE
NITRITE: NEGATIVE
Protein, ur: 100 mg/dL — AB
SPECIFIC GRAVITY, URINE: 1.035 — AB (ref 1.005–1.030)
pH: 5 (ref 5.0–8.0)

## 2016-10-11 LAB — BASIC METABOLIC PANEL
Anion gap: 9 (ref 5–15)
BUN: 12 mg/dL (ref 6–20)
CALCIUM: 8.3 mg/dL — AB (ref 8.9–10.3)
CO2: 25 mmol/L (ref 22–32)
CREATININE: 0.85 mg/dL (ref 0.61–1.24)
Chloride: 98 mmol/L — ABNORMAL LOW (ref 101–111)
GFR calc non Af Amer: >60 mL/min (ref >60)
Glucose, Bld: 171 mg/dL — ABNORMAL HIGH (ref 65–99)
Potassium: 3.6 mmol/L (ref 3.5–5.1)
SODIUM: 132 mmol/L — AB (ref 135–145)

## 2016-10-11 LAB — GLUCOSE, CAPILLARY
GLUCOSE-CAPILLARY: 146 mg/dL — AB (ref 65–99)
Glucose-Capillary: 177 mg/dL — ABNORMAL HIGH (ref 65–99)
Glucose-Capillary: 189 mg/dL — ABNORMAL HIGH (ref 65–99)
Glucose-Capillary: 191 mg/dL — ABNORMAL HIGH (ref 65–99)

## 2016-10-11 MED ORDER — INFLUENZA VAC SPLIT QUAD 0.5 ML IM SUSY
0.5000 mL | PREFILLED_SYRINGE | INTRAMUSCULAR | Status: DC
  Filled 2016-10-11: qty 0.5

## 2016-10-11 MED ORDER — HYDROCODONE-ACETAMINOPHEN 5-325 MG PO TABS
1.0000 | ORAL_TABLET | Freq: Four times a day (QID) | ORAL | Status: DC | PRN
  Administered 2016-10-11 – 2016-10-13 (×6): 2 via ORAL
  Filled 2016-10-11 (×6): qty 2

## 2016-10-11 MED ORDER — GUAIFENESIN ER 600 MG PO TB12
600.0000 mg | ORAL_TABLET | Freq: Two times a day (BID) | ORAL | Status: DC
  Administered 2016-10-11 – 2016-10-14 (×7): 600 mg via ORAL
  Filled 2016-10-11 (×7): qty 1

## 2016-10-11 MED ORDER — MUPIROCIN CALCIUM 2 % EX CREA
TOPICAL_CREAM | Freq: Every day | CUTANEOUS | Status: DC
  Administered 2016-10-11 – 2016-10-14 (×4): via TOPICAL
  Filled 2016-10-11: qty 15

## 2016-10-11 MED ORDER — OSELTAMIVIR PHOSPHATE 75 MG PO CAPS
75.0000 mg | ORAL_CAPSULE | Freq: Two times a day (BID) | ORAL | Status: DC
  Administered 2016-10-11 – 2016-10-14 (×6): 75 mg via ORAL
  Filled 2016-10-11 (×6): qty 1

## 2016-10-11 MED ORDER — HYDROCOD POLST-CPM POLST ER 10-8 MG/5ML PO SUER
5.0000 mL | Freq: Once | ORAL | Status: AC
  Administered 2016-10-11: 5 mL via ORAL
  Filled 2016-10-11: qty 5

## 2016-10-11 NOTE — Care Management Note (Signed)
Case Management Note  Patient Details  Name: Cody Harper MRN: [id number] Date of Birth: 10-02-1966  Subjective/Objective:     CM following for progression and d/c planning.                Action/Plan: 10/11/2016 Noted CM consult for PCP, pt is uninsured. This CM provide phone number and info re [facility] and [facility]. Pt will need to schedule himself as no appointments are available today.   Expected Discharge Date:                  Expected Discharge Plan:  Home/Self Care  In-House Referral:  NA  Discharge planning Services  CM Consult, [facility]  Post Acute Care Choice:    Choice offered to:     DME Arranged:    DME Agency:     HH Arranged:    HH Agency:     Status of Service:  In process, will continue to follow  If discussed at Long Length of Stay Meetings, dates discussed:    Additional Comments:  Adron Bene, RN 10/11/2016, 5:00 PM

## 2016-10-11 NOTE — Progress Notes (Addendum)
Inpatient Diabetes Program Recommendations  AACE/ADA: New Consensus Statement on Inpatient Glycemic Control (2015)  Target Ranges:  Prepandial:   less than 140 mg/dL      Peak postprandial:   less than 180 mg/dL (1-2 hours)      Critically ill patients:  140 - 180 mg/dL    Results for Cody Harper, Cody Harper (MRN [id number]) as of 10/11/2016 08:42  Ref. Range 10/10/2016 13:19 10/10/2016 21:00 10/11/2016 07:26  Glucose-Capillary Latest Ref Range: 65 - 99 mg/dL 349 (H) 227 (H) 177 (H)   Results for Cody Harper, Cody Harper (MRN [id number]) as of 10/11/2016 08:42  Ref. Range 11/12/2014 06:20 08/06/2016 03:52 10/10/2016 19:45  Hemoglobin A1C Latest Ref Range: 4.8 - 5.6 % 11.8 (H) 12.7 (H) 11.4 (H)   Review of Glycemic Control  Diabetes history:     DM (consistently not well controlled over last 2 years), Diabetic with chronic wound (septic due to cellulitis), Obesity, BUN and Creatinine WNL Outpatient Diabetes medications:     Metformin 1000 mg BID,     Novolog Mix 70/30 30 units with a meal BID Current orders for Inpatient glycemic control:     Novolog 0-15 units TIDAC correction,     Novolog Mix 70/30 30 units BID basal and meal coverage combined  Inpatient Diabetes Program Recommendations:    Noted that patient was seen by Lorenda Peck, Inpatient DM Program RD, on 08/05/2016.  At that time, [name] stressed the importance of finding a PCP to manage DM.  DM complications explained and Living Well with DM given to patient.  Patient has received outpatient DM education and support in past.     A HGBA1C of 11.4% indicates an average CBG of 285 mg/dL and therefore this patient's self management of his DM has not significantly improved over a 2 year period.     Currently, CBG's responding well to home insulin dosing, which may indicate that the patient is not following the same care plan at home.  At bedside, discussed long-term management with patient.  Patient states he is taking insulin and recording  CBG's.  Patient states that CBG's have been getting a little better and will continue to monitor CBG's and take medications as prescribed.  Thank you,  Windy Carina, RN, MSN Diabetes Coordinator Inpatient Diabetes Program [phone] (Team Pager)

## 2016-10-11 NOTE — Progress Notes (Signed)
PROGRESS NOTE    Cody Harper  [id number] DOB: 1966-05-28 DOA: 10/10/2016 PCP: No PCP Per Patient  Brief Narrative: Cody Harper is a 50 y.o. 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 months presents to rthe ER with fevers, chills and R leg pain and swelling around the wound and some tenderness extending upto the thigh for the last 2 days., in ED found to have cellulitis, fevers, lactic acidosis  Assessment & Plan:   Sepsis due to cellulitis -started Iv Vanc/Zosyn since diabetic with chronic wound -FU blood cx -MRI without osteomyelitis -lactate cleared, IVF  Cough/congestion -supportive care, check FLu and Resp virus panel -mucinex/benzonatate    Diabetes type 2, uncontrolled ([place]) -last hba1c was 12.7 -has no PCP/CM consulted -continue Insulin 70/30, SSI,     Obesity    Tobacco use disorder -counseled, nicotine patch   DM neuropathy -resumed gabapentin  DVT prophylaxis: lovenox Code Status: Full code Family Communication: no family at bedside Disposition Plan: home in 2-3days  Consultants:    Antimicrobials:Vanc Zosyn   Subjective: C/o cough, congestion  Objective: Vitals:   10/10/16 1851 10/10/16 2227 10/11/16 0200 10/11/16 0527  BP: (!) 178/91 (!) 142/77 (!) 141/73 133/62  Pulse: 90 92 92 80  Resp: 18 18 20 18   Temp: (!) 101.7 F (38.7 C) (!) 101.3 F (38.5 C) (!) 101.8 F (38.8 C) (!) 100.7 F (38.2 C)  TempSrc: Oral Oral Oral Oral  SpO2: 100% 97% 95% 97%    Intake/Output Summary (Last 24 hours) at 10/11/16 1331 Last data filed at 10/11/16 1300  Gross per 24 hour  Intake             3530 ml  Output             1000 ml  Net             2530 ml   There were no vitals filed for this visit.  Examination:  General exam: Appears calm and comfortable, ill appearing  Respiratory system: Clear to auscultation. Respiratory effort  normal. Cardiovascular system: S1 & S2 heard, RRR. No JVD, murmurs, rubs, gallops or clicks. No pedal edema. Gastrointestinal system: Abdomen is nondistended, soft and nontender. No organomegaly or masses felt. Normal bowel sounds heard. Central nervous system: Alert and oriented. No focal neurological deficits. Extremities: right lower leg with open 2x5cm deep ulcer with dried secretions, surrounding erythema involving most of lower leg, diminished DP pulses Skin: as above Psychiatry: Judgement and insight appear normal. Mood & affect appropriate.     Data Reviewed: I have personally reviewed following labs and imaging studies  CBC:  Recent Labs Lab 10/10/16 1314 10/11/16 0501  WBC 16.2* 12.4*  NEUTROABS 13.4*  --   HGB 15.3 13.4  HCT 42.6 38.7*  MCV 84.4 85.1  PLT 232 Q000111Q   Basic Metabolic Panel:  Recent Labs Lab 10/10/16 1640 10/11/16 0501  NA 134* 132*  K 3.7 3.6  CL 98* 98*  CO2 25 25  GLUCOSE 265* 171*  BUN 13 12  CREATININE 0.97 0.85  CALCIUM 8.5* 8.3*   GFR: CrCl cannot be calculated (Unknown ideal weight.). Liver Function Tests:  Recent Labs Lab 10/10/16 1640  AST 14*  ALT 13*  ALKPHOS 79  BILITOT 0.6  PROT 6.8  ALBUMIN 3.0*   No results for input(s): LIPASE, AMYLASE in the last 168 hours. No results for input(s): AMMONIA in the last 168  hours. Coagulation Profile: No results for input(s): INR, PROTIME in the last 168 hours. Cardiac Enzymes: No results for input(s): CKTOTAL, CKMB, CKMBINDEX, TROPONINI in the last 168 hours. BNP (last 3 results) No results for input(s): PROBNP in the last 8760 hours. HbA1C:  Recent Labs  10/10/16 1945  HGBA1C 11.4*   CBG:  Recent Labs Lab 10/10/16 1319 10/10/16 2100 10/11/16 0726 10/11/16 1121  GLUCAP 349* 227* 177* 189*   Lipid Profile: No results for input(s): CHOL, HDL, LDLCALC, TRIG, CHOLHDL, LDLDIRECT in the last 72 hours. Thyroid Function Tests: No results for input(s): TSH, T4TOTAL,  FREET4, T3FREE, THYROIDAB in the last 72 hours. Anemia Panel: No results for input(s): VITAMINB12, FOLATE, FERRITIN, TIBC, IRON, RETICCTPCT in the last 72 hours. Urine analysis:    Component Value Date/Time   COLORURINE YELLOW 10/10/2016 1314   APPEARANCEUR HAZY (A) 10/10/2016 1314   LABSPEC 1.035 (H) 10/10/2016 1314   PHURINE 5.0 10/10/2016 1314   GLUCOSEU 150 (A) 10/10/2016 1314   HGBUR NEGATIVE 10/10/2016 1314   BILIRUBINUR NEGATIVE 10/10/2016 1314   KETONESUR 5 (A) 10/10/2016 1314   PROTEINUR 100 (A) 10/10/2016 1314   UROBILINOGEN 0.2 04/18/2015 1434   NITRITE NEGATIVE 10/10/2016 1314   LEUKOCYTESUR NEGATIVE 10/10/2016 1314   Sepsis Labs: @LABRCNTIP (procalcitonin:4,lacticidven:4)  )No results found for this or any previous visit (from the past 240 hour(s)).       Radiology Studies: Dg Chest 2 View  Result Date: 10/10/2016 CLINICAL DATA:  Right leg swelling infection. Dry cough and fever. Headache and dizziness. EXAM: CHEST  2 VIEW COMPARISON:  Two-view chest x-ray 10/10/2016. FINDINGS: Low lung volumes exaggerate the heart size and pulmonary interstitium. No focal airspace disease present. There is no edema or effusion. IMPRESSION: No active cardiopulmonary disease. Electronically Signed   By: San Morelle M.D.   On: 10/10/2016 14:35   Dg Tibia/fibula Right  Result Date: 10/10/2016 CLINICAL DATA:  Right lower leg pain. EXAM: RIGHT TIBIA AND FIBULA - 2 VIEW COMPARISON:  11/11/2014 FINDINGS: There is diffuse soft tissue swelling. Calcified phleboliths are identified along the medial aspect of the lower leg. No acute fractures or subluxations. IMPRESSION: 1. No acute bone abnormalities. 2. Soft tissue swelling. Electronically Signed   By: Kerby Moors M.D.   On: 10/10/2016 16:11   Mr Tibia Fibula Right W Wo Contrast  Result Date: 10/10/2016 CLINICAL DATA:  Uncontrolled diabetic with right leg pain, swelling and fever. EXAM: MRI OF LOWER RIGHT EXTREMITY WITHOUT AND  WITH CONTRAST TECHNIQUE: Multiplanar, multisequence MR imaging of the right leg was performed both before and after administration of intravenous contrast. CONTRAST:  35mL MULTIHANCE GADOBENATE DIMEGLUMINE 529 MG/ML IV SOLN COMPARISON:  None. FINDINGS: Bones/Joint/Cartilage No findings of osteomyelitis.  No fracture bone destruction. Ligaments Negative Muscles and Tendons No intramuscular mass, abscess or hematoma. Soft tissues Soft tissue edema swelling of the right leg consistent with cellulitis. Superficial varicose veins of the right leg medially. IMPRESSION: Cellulitis of the right leg without evidence of osteomyelitis. Electronically Signed   By: Ashley Royalty M.D.   On: 10/10/2016 22:37        Scheduled Meds: . aspirin EC  81 mg Oral Daily  . buPROPion  150 mg Oral BID  . enoxaparin (LOVENOX) injection  40 mg Subcutaneous Q24H  . gabapentin  100 mg Oral TID  . guaiFENesin  600 mg Oral BID  . insulin aspart  0-15 Units Subcutaneous TID WC  . insulin aspart protamine- aspart  30 Units Subcutaneous BID WC  .  mupirocin cream   Topical Daily  . piperacillin-tazobactam (ZOSYN)  IV  3.375 g Intravenous Q8H  . pravastatin  20 mg Oral q1800  . vancomycin  1,250 mg Intravenous Q12H   Continuous Infusions: . sodium chloride 75 mL/hr at 10/11/16 1221     LOS: 1 day    Time spent: 19min    Domenic Polite, MD [facility] Pager [phone]  If 7PM-7AM, please contact night-coverage [email or web address] Password [email or web address] 10/11/2016, 1:31 PM

## 2016-10-11 NOTE — Progress Notes (Signed)
Fever and non-productive cough noted. Resp panel obtained. Came back positive for Influenza A. Dr Broadus John notified. Droplet precaution maint.

## 2016-10-11 NOTE — Consult Note (Signed)
[facility] Nurse wound consult note Reason for Consult: Consult requested for right leg wound.  Pt is currently being treated for cellulitis to RLE with systemic antibiotics. Wound type: Full thickness stasis ulcer to right lower calf Measurement: 3.2X1.8X.2cm Wound bed: 10% yellow, 90% dry red wound bed Drainage (amount, consistency, odor) No odor or drainage Periwound: Generalized erythremia and edema to RLE surrounding wound Dressing procedure/placement/frequency: Bactroban to promote moist healing, foam dressing to protect from further injury.  Discussed plan of care with patient and he verbalized understanding. Please re-consult if further assistance is needed.  Thank-you,  Julien Girt MSN, [place], [place], [place], [place]

## 2016-10-12 LAB — URINE CULTURE: Culture: NO GROWTH

## 2016-10-12 LAB — GLUCOSE, CAPILLARY
GLUCOSE-CAPILLARY: 150 mg/dL — AB (ref 65–99)
GLUCOSE-CAPILLARY: 192 mg/dL — AB (ref 65–99)
GLUCOSE-CAPILLARY: 160 mg/dL — AB (ref 65–99)
Glucose-Capillary: 178 mg/dL — ABNORMAL HIGH (ref 65–99)

## 2016-10-12 MED ORDER — FUROSEMIDE 10 MG/ML IJ SOLN
20.0000 mg | Freq: Once | INTRAMUSCULAR | Status: AC
  Administered 2016-10-12: 20 mg via INTRAVENOUS
  Filled 2016-10-12: qty 2

## 2016-10-12 NOTE — Progress Notes (Signed)
PROGRESS NOTE    JEKHI MCNIVEN  [id number] DOB: 12-12-1965 DOA: 10/10/2016 PCP: No PCP Per Patient  Brief Narrative: NAME Cody Harper is a 50 y.o. 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 months presents to rthe ER with fevers, chills and R leg pain and swelling around the wound and some tenderness extending upto the thigh for the last 2 days., in ED found to have cellulitis, fevers, lactic acidosis  Assessment & Plan:   Sepsis due to cellulitis -started Iv Vanc/Zosyn since diabetic with chronic wound -blood cx negative, STop Zosyn, continue Vanc today -MRI without osteomyelitis -lactate cleared, stop IVF  Influenza A -supportive care -mucinex/benzonatate    Diabetes type 2, uncontrolled ([place]) -last hba1c was 12.7 -has no PCP/CM consulted -continue Insulin 70/30, SSI,     Obesity    Tobacco use disorder -counseled, nicotine patch   DM neuropathy -resumed gabapentin  DVT prophylaxis: lovenox Code Status: Full code Family Communication: no family at bedside Disposition Plan: home in 1-2days  Consultants:    Antimicrobials:Vanc Zosyn   Subjective: C/o cough, congestion, starting to feel better  Objective: Vitals:   10/11/16 1733 10/11/16 2018 10/12/16 0547 10/12/16 1239  BP:  140/79 125/77 (!) 148/85  Pulse:  89 88 82  Resp:  18 18 18   Temp: (!) 102 F (38.9 C) 99.5 F (37.5 C) 99.3 F (37.4 C) 99.1 F (37.3 C)  TempSrc: Oral Oral Oral Oral  SpO2:  98% 98% 96%  Weight:    (!) 138.6 kg (305 lb 8 oz)  Height:    6\' 3"  (1.905 m)    Intake/Output Summary (Last 24 hours) at 10/12/16 1459 Last data filed at 10/12/16 1233  Gross per 24 hour  Intake          2738.75 ml  Output             1725 ml  Net          1013.75 ml   Filed Weights   10/12/16 1239  Weight: (!) 138.6 kg (305 lb 8 oz)    Examination:  General exam: Appears calm and comfortable,  ill appearing  Respiratory system: Clear to auscultation. Respiratory effort normal. Cardiovascular system: S1 & S2 heard, RRR. No JVD, murmurs, rubs, gallops or clicks. No pedal edema. Gastrointestinal system: Abdomen is nondistended, soft and nontender. No organomegaly or masses felt. Normal bowel sounds heard. Central nervous system: Alert and oriented. No focal neurological deficits. Extremities: right lower leg with open 2x5cm deep ulcer with dried secretions, surrounding erythema involving most of lower leg, diminished DP pulses Skin: as above Psychiatry: Judgement and insight appear normal. Mood & affect appropriate.     Data Reviewed: I have personally reviewed following labs and imaging studies  CBC:  Recent Labs Lab 10/10/16 1314 10/11/16 0501  WBC 16.2* 12.4*  NEUTROABS 13.4*  --   HGB 15.3 13.4  HCT 42.6 38.7*  MCV 84.4 85.1  PLT 232 Q000111Q   Basic Metabolic Panel:  Recent Labs Lab 10/10/16 1640 10/11/16 0501  NA 134* 132*  K 3.7 3.6  CL 98* 98*  CO2 25 25  GLUCOSE 265* 171*  BUN 13 12  CREATININE 0.97 0.85  CALCIUM 8.5* 8.3*   GFR: Estimated Creatinine Clearance: 154.3 mL/min (by C-G formula based on SCr of 0.85 mg/dL). Liver Function Tests:  Recent Labs Lab 10/10/16 1640  AST 14*  ALT 13*  ALKPHOS 79  BILITOT 0.6  PROT 6.8  ALBUMIN 3.0*   No results for input(s): LIPASE, AMYLASE in the last 168 hours. No results for input(s): AMMONIA in the last 168 hours. Coagulation Profile: No results for input(s): INR, PROTIME in the last 168 hours. Cardiac Enzymes: No results for input(s): CKTOTAL, CKMB, CKMBINDEX, TROPONINI in the last 168 hours. BNP (last 3 results) No results for input(s): PROBNP in the last 8760 hours. HbA1C:  Recent Labs  10/10/16 1945  HGBA1C 11.4*   CBG:  Recent Labs Lab 10/11/16 1121 10/11/16 1729 10/11/16 2154 10/12/16 0736 10/12/16 1238  GLUCAP 189* 191* 146* 150* 178*   Lipid Profile: No results for input(s):  CHOL, HDL, LDLCALC, TRIG, CHOLHDL, LDLDIRECT in the last 72 hours. Thyroid Function Tests: No results for input(s): TSH, T4TOTAL, FREET4, T3FREE, THYROIDAB in the last 72 hours. Anemia Panel: No results for input(s): VITAMINB12, FOLATE, FERRITIN, TIBC, IRON, RETICCTPCT in the last 72 hours. Urine analysis:    Component Value Date/Time   COLORURINE YELLOW 10/10/2016 1314   APPEARANCEUR HAZY (A) 10/10/2016 1314   LABSPEC 1.035 (H) 10/10/2016 1314   PHURINE 5.0 10/10/2016 1314   GLUCOSEU 150 (A) 10/10/2016 1314   HGBUR NEGATIVE 10/10/2016 1314   BILIRUBINUR NEGATIVE 10/10/2016 1314   KETONESUR 5 (A) 10/10/2016 1314   PROTEINUR 100 (A) 10/10/2016 1314   UROBILINOGEN 0.2 04/18/2015 1434   NITRITE NEGATIVE 10/10/2016 1314   LEUKOCYTESUR NEGATIVE 10/10/2016 1314   Sepsis Labs: @LABRCNTIP (procalcitonin:4,lacticidven:4)  ) Recent Results (from the past 240 hour(s))  Urine culture     Status: None   Collection Time: 10/10/16  2:39 PM  Result Value Ref Range Status   Specimen Description URINE, RANDOM  Final   Special Requests NONE  Final   Culture NO GROWTH  Final   Report Status 10/12/2016 FINAL  Final  Wound or Superficial Culture     Status: None (Preliminary result)   Collection Time: 10/10/16  2:44 PM  Result Value Ref Range Status   Specimen Description WOUND ULCER  Final   Special Requests NONE  Final   Gram Stain   Final    RARE WBC PRESENT, PREDOMINANTLY PMN FEW GRAM POSITIVE COCCI IN PAIRS    Culture   Final    MODERATE STAPHYLOCOCCUS AUREUS FEW STREPTOCOCCUS GROUP G    Report Status PENDING  Incomplete  Blood Culture (routine x 2)     Status: None (Preliminary result)   Collection Time: 10/10/16  3:00 PM  Result Value Ref Range Status   Specimen Description BLOOD LEFT ANTECUBITAL  Final   Special Requests BOTTLES DRAWN AEROBIC AND ANAEROBIC 10CC  Final   Culture NO GROWTH 2 DAYS  Final   Report Status PENDING  Incomplete  Blood Culture (routine x 2)     Status:  None (Preliminary result)   Collection Time: 10/10/16  3:15 PM  Result Value Ref Range Status   Specimen Description BLOOD LEFT HAND  Final   Special Requests BOTTLES DRAWN AEROBIC AND ANAEROBIC 10CC  Final   Culture NO GROWTH 2 DAYS  Final   Report Status PENDING  Incomplete  Respiratory Panel by PCR     Status: Abnormal   Collection Time: 10/11/16  1:32 PM  Result Value Ref Range Status   Adenovirus NOT DETECTED NOT DETECTED Final   Coronavirus 229E NOT DETECTED NOT DETECTED Final   Coronavirus HKU1 NOT DETECTED NOT DETECTED Final   Coronavirus NL63 NOT DETECTED NOT DETECTED Final   Coronavirus OC43 NOT DETECTED NOT DETECTED  Final   Metapneumovirus NOT DETECTED NOT DETECTED Final   Rhinovirus / Enterovirus NOT DETECTED NOT DETECTED Final   Influenza A H3 DETECTED (A) NOT DETECTED Final   Influenza B NOT DETECTED NOT DETECTED Final   Parainfluenza Virus 1 NOT DETECTED NOT DETECTED Final   Parainfluenza Virus 2 NOT DETECTED NOT DETECTED Final   Parainfluenza Virus 3 NOT DETECTED NOT DETECTED Final   Parainfluenza Virus 4 NOT DETECTED NOT DETECTED Final   Respiratory Syncytial Virus NOT DETECTED NOT DETECTED Final   Bordetella pertussis NOT DETECTED NOT DETECTED Final   Chlamydophila pneumoniae NOT DETECTED NOT DETECTED Final   Mycoplasma pneumoniae NOT DETECTED NOT DETECTED Final         Radiology Studies: Dg Tibia/fibula Right  Result Date: 10/10/2016 CLINICAL DATA:  Right lower leg pain. EXAM: RIGHT TIBIA AND FIBULA - 2 VIEW COMPARISON:  11/11/2014 FINDINGS: There is diffuse soft tissue swelling. Calcified phleboliths are identified along the medial aspect of the lower leg. No acute fractures or subluxations. IMPRESSION: 1. No acute bone abnormalities. 2. Soft tissue swelling. Electronically Signed   By: Kerby Moors M.D.   On: 10/10/2016 16:11   Mr Tibia Fibula Right W Wo Contrast  Result Date: 10/10/2016 CLINICAL DATA:  Uncontrolled diabetic with right leg pain,  swelling and fever. EXAM: MRI OF LOWER RIGHT EXTREMITY WITHOUT AND WITH CONTRAST TECHNIQUE: Multiplanar, multisequence MR imaging of the right leg was performed both before and after administration of intravenous contrast. CONTRAST:  27mL MULTIHANCE GADOBENATE DIMEGLUMINE 529 MG/ML IV SOLN COMPARISON:  None. FINDINGS: Bones/Joint/Cartilage No findings of osteomyelitis.  No fracture bone destruction. Ligaments Negative Muscles and Tendons No intramuscular mass, abscess or hematoma. Soft tissues Soft tissue edema swelling of the right leg consistent with cellulitis. Superficial varicose veins of the right leg medially. IMPRESSION: Cellulitis of the right leg without evidence of osteomyelitis. Electronically Signed   By: Ashley Royalty M.D.   On: 10/10/2016 22:37        Scheduled Meds: . aspirin EC  81 mg Oral Daily  . buPROPion  150 mg Oral BID  . enoxaparin (LOVENOX) injection  40 mg Subcutaneous Q24H  . gabapentin  100 mg Oral TID  . guaiFENesin  600 mg Oral BID  . Influenza vac split quadrivalent PF  0.5 mL Intramuscular Tomorrow-1000  . insulin aspart  0-15 Units Subcutaneous TID WC  . insulin aspart protamine- aspart  30 Units Subcutaneous BID WC  . mupirocin cream   Topical Daily  . oseltamivir  75 mg Oral BID  . pravastatin  20 mg Oral q1800  . vancomycin  1,250 mg Intravenous Q12H   Continuous Infusions:    LOS: 2 days    Time spent: 41min    Domenic Polite, MD [facility] Pager [phone]  If 7PM-7AM, please contact night-coverage [email or web address] Password [email or web address] 10/12/2016, 2:59 PM

## 2016-10-13 LAB — BASIC METABOLIC PANEL
Anion gap: 8 (ref 5–15)
BUN: 10 mg/dL (ref 6–20)
CHLORIDE: 97 mmol/L — AB (ref 101–111)
CO2: 27 mmol/L (ref 22–32)
Calcium: 8.8 mg/dL — ABNORMAL LOW (ref 8.9–10.3)
Creatinine, Ser: 0.64 mg/dL (ref 0.61–1.24)
GFR calc non Af Amer: >60 mL/min (ref >60)
Glucose, Bld: 170 mg/dL — ABNORMAL HIGH (ref 65–99)
POTASSIUM: 3.6 mmol/L (ref 3.5–5.1)
SODIUM: 132 mmol/L — AB (ref 135–145)

## 2016-10-13 LAB — GLUCOSE, CAPILLARY
GLUCOSE-CAPILLARY: 148 mg/dL — AB (ref 65–99)
GLUCOSE-CAPILLARY: 178 mg/dL — AB (ref 65–99)
GLUCOSE-CAPILLARY: 231 mg/dL — AB (ref 65–99)
GLUCOSE-CAPILLARY: 200 mg/dL — AB (ref 65–99)

## 2016-10-13 LAB — CBC
HCT: 37.5 % — ABNORMAL LOW (ref 39.0–52.0)
HEMOGLOBIN: 12.7 g/dL — AB (ref 13.0–17.0)
MCH: 29.4 pg (ref 26.0–34.0)
MCHC: 33.9 g/dL (ref 30.0–36.0)
MCV: 86.8 fL (ref 78.0–100.0)
Platelets: 236 10*3/uL (ref 150–400)
RBC: 4.32 MIL/uL (ref 4.22–5.81)
RDW: 14.7 % (ref 11.5–15.5)
WBC: 8.3 10*3/uL (ref 4.0–10.5)

## 2016-10-13 LAB — AEROBIC CULTURE W GRAM STAIN (SUPERFICIAL SPECIMEN)

## 2016-10-13 LAB — AEROBIC CULTURE  (SUPERFICIAL SPECIMEN)

## 2016-10-13 MED ORDER — ENOXAPARIN SODIUM 80 MG/0.8ML ~~LOC~~ SOLN
0.5000 mg/kg | SUBCUTANEOUS | Status: DC
  Administered 2016-10-13: 70 mg via SUBCUTANEOUS
  Filled 2016-10-13: qty 0.8

## 2016-10-13 NOTE — Progress Notes (Signed)
Pharmacy Antibiotic Note  Cody Harper is a 50 y.o. male admitted on 10/10/2016 with cellulitis.  Pharmacy has been consulted for Vancomycin dosing. He is on day #4 of Vancomycin. Note a wound culture is growing Staph aureus and Group G Strep with sensitivities still pending.  He is also influenza A positive and is on Day #2/5 of Tamiflu. His BMI is 38 so will adjust his Lovenox for obesity.  Plan: Continue Vancomycin 1250mg  IV q12h Follow-up sensitivities on wound culture organisms - if no MRSA is isolated recommend discontinuing Vancomycin and using a beta-lactam for cellulitis treatment.  If MRSA is isolated will check a Vancomycin trough for monitoring. Check BMET with AM labs 1/1. Increase Lovenox to 0.5mg /kg q24h for DVT prophylaxis for BMI >30.  Height: 6\' 3"  (190.5 cm) Weight: (!) 305 lb 8 oz (138.6 kg) IBW/kg (Calculated) : 84.5  Temp (24hrs), Avg:98.9 F (37.2 C), Min:98.3 F (36.8 C), Max:99.2 F (37.3 C)   Recent Labs Lab 10/10/16 1314 10/10/16 1337 10/10/16 1640 10/10/16 1658 10/11/16 0501  WBC 16.2*  --   --   --  12.4*  CREATININE  --   --  0.97  --  0.85  LATICACIDVEN  --  3.40*  --  1.54  --     Estimated Creatinine Clearance: 154.3 mL/min (by C-G formula based on SCr of 0.85 mg/dL).    No Known Allergies  Antimicrobials this admission: Vanc 12/28 >> Zosyn 12/28 >>12/30 Tamiflu 12/29>> (1/3)  Dose adjustments this admission:    Microbiology results:  12/28 BCx: ngtd 12/28 UCx: neg 12/28 Wound Cx (superficial): Staph aureus, Group G Strep - sensitivities pending 12/29 influenza A positive  Thank you for allowing pharmacy to be a part of this patient's care.  Legrand Como, Pharm.D., BCPS, AAHIVP Clinical Pharmacist Phone: [phone] or [phone] 10/13/2016, 8:28 AM

## 2016-10-13 NOTE — Progress Notes (Addendum)
PROGRESS NOTE    Cody Harper  [id number] DOB: 10/08/1966 DOA: 10/10/2016 PCP: No PCP Per Patient  Brief Narrative: Cody Harper is a 50 y.o. 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 months presents to rthe ER with fevers, chills and R leg pain and swelling around the wound and some tenderness extending upto the thigh for the last 2 days., in ED found to have cellulitis, fevers, lactic acidosis  Assessment & Plan:   Sepsis due to cellulitis -started Iv Vanc/Zosyn since diabetic with chronic wound -blood cx negative, STop Zosyn, continue Vanc today -MRI without osteomyelitis -lactate cleared, stop IVF  Influenza A -supportive care, tamiflu -mucinex/benzonatate   Diabetes type 2, uncontrolled ([place]) -last hba1c was 12.7 -has no PCP/CM consulted -continue Insulin 70/30, SSI,    Obesity   Tobacco use disorder -counseled, nicotine patch   DM neuropathy -resumed gabapentin  DVT prophylaxis: lovenox Code Status: Full code Family Communication: no family at bedside Disposition Plan: home in 1-2days  Consultants:    Antimicrobials:Vanc Zosyn   Subjective: C/o cough, congestion, starting to feel better  Objective: Vitals:   10/12/16 0547 10/12/16 1239 10/12/16 2036 10/13/16 0449  BP: 125/77 (!) 148/85 125/72 117/78  Pulse: 88 82 72 72  Resp: 18 18 19    Temp: 99.3 F (37.4 C) 99.1 F (37.3 C) 99.2 F (37.3 C) 98.3 F (36.8 C)  TempSrc: Oral Oral Oral Oral  SpO2: 98% 96% 96% 98%  Weight:  (!) 138.6 kg (305 lb 8 oz)    Height:  6\' 3"  (1.905 m)      Intake/Output Summary (Last 24 hours) at 10/13/16 1441 Last data filed at 10/13/16 1140  Gross per 24 hour  Intake              822 ml  Output             1300 ml  Net             -478 ml   Filed Weights   10/12/16 1239  Weight: (!) 138.6 kg (305 lb 8 oz)    Examination:  General exam: Appears calm and  comfortable, ill appearing  Respiratory system: Clear to auscultation. Respiratory effort normal. Cardiovascular system: S1 & S2 heard, RRR. No JVD, murmurs, rubs, gallops or clicks. No pedal edema. Gastrointestinal system: Abdomen is nondistended, soft and nontender. No organomegaly or masses felt. Normal bowel sounds heard. Central nervous system: Alert and oriented. No focal neurological deficits. Extremities: right lower leg with open 2x5cm deep ulcer with dried secretions, surrounding erythema involving most of lower leg, diminished DP pulses Skin: as above Psychiatry: Judgement and insight appear normal. Mood & affect appropriate.     Data Reviewed: I have personally reviewed following labs and imaging studies  CBC:  Recent Labs Lab 10/10/16 1314 10/11/16 0501 10/13/16 1217  WBC 16.2* 12.4* 8.3  NEUTROABS 13.4*  --   --   HGB 15.3 13.4 12.7*  HCT 42.6 38.7* 37.5*  MCV 84.4 85.1 86.8  PLT 232 194 [id number]   Basic Metabolic Panel:  Recent Labs Lab 10/10/16 1640 10/11/16 0501 10/13/16 1217  NA 134* 132* 132*  K 3.7 3.6 3.6  CL 98* 98* 97*  CO2 25 25 27   GLUCOSE 265* 171* 170*  BUN 13 12 10   CREATININE 0.97 0.85 0.64  CALCIUM 8.5* 8.3* 8.8*   GFR: Estimated Creatinine Clearance: 163.9 mL/min (by C-G formula  based on SCr of 0.64 mg/dL). Liver Function Tests:  Recent Labs Lab 10/10/16 1640  AST 14*  ALT 13*  ALKPHOS 79  BILITOT 0.6  PROT 6.8  ALBUMIN 3.0*   No results for input(s): LIPASE, AMYLASE in the last 168 hours. No results for input(s): AMMONIA in the last 168 hours. Coagulation Profile: No results for input(s): INR, PROTIME in the last 168 hours. Cardiac Enzymes: No results for input(s): CKTOTAL, CKMB, CKMBINDEX, TROPONINI in the last 168 hours. BNP (last 3 results) No results for input(s): PROBNP in the last 8760 hours. HbA1C:  Recent Labs  10/10/16 1945  HGBA1C 11.4*   CBG:  Recent Labs Lab 10/12/16 1238 10/12/16 1652 10/12/16 2138  10/13/16 0752 10/13/16 1132  GLUCAP 178* 192* 160* 148* 178*   Lipid Profile: No results for input(s): CHOL, HDL, LDLCALC, TRIG, CHOLHDL, LDLDIRECT in the last 72 hours. Thyroid Function Tests: No results for input(s): TSH, T4TOTAL, FREET4, T3FREE, THYROIDAB in the last 72 hours. Anemia Panel: No results for input(s): VITAMINB12, FOLATE, FERRITIN, TIBC, IRON, RETICCTPCT in the last 72 hours. Urine analysis:    Component Value Date/Time   COLORURINE YELLOW 10/10/2016 1314   APPEARANCEUR HAZY (A) 10/10/2016 1314   LABSPEC 1.035 (H) 10/10/2016 1314   PHURINE 5.0 10/10/2016 1314   GLUCOSEU 150 (A) 10/10/2016 1314   HGBUR NEGATIVE 10/10/2016 1314   BILIRUBINUR NEGATIVE 10/10/2016 1314   KETONESUR 5 (A) 10/10/2016 1314   PROTEINUR 100 (A) 10/10/2016 1314   UROBILINOGEN 0.2 04/18/2015 1434   NITRITE NEGATIVE 10/10/2016 1314   LEUKOCYTESUR NEGATIVE 10/10/2016 1314   Sepsis Labs: @LABRCNTIP (procalcitonin:4,lacticidven:4)  ) Recent Results (from the past 240 hour(s))  Urine culture     Status: None   Collection Time: 10/10/16  2:39 PM  Result Value Ref Range Status   Specimen Description URINE, RANDOM  Final   Special Requests NONE  Final   Culture NO GROWTH  Final   Report Status 10/12/2016 FINAL  Final  Wound or Superficial Culture     Status: None   Collection Time: 10/10/16  2:44 PM  Result Value Ref Range Status   Specimen Description WOUND ULCER  Final   Special Requests NONE  Final   Gram Stain   Final    RARE WBC PRESENT, PREDOMINANTLY PMN FEW GRAM POSITIVE COCCI IN PAIRS    Culture   Final    MODERATE STAPHYLOCOCCUS AUREUS FEW STREPTOCOCCUS GROUP G    Report Status 10/13/2016 FINAL  Final   Organism ID, Bacteria STAPHYLOCOCCUS AUREUS  Final      Susceptibility   Staphylococcus aureus - MIC*    CIPROFLOXACIN <=0.5 SENSITIVE Sensitive     ERYTHROMYCIN >=8 RESISTANT Resistant     GENTAMICIN <=0.5 SENSITIVE Sensitive     OXACILLIN <=0.25 SENSITIVE Sensitive      TETRACYCLINE <=1 SENSITIVE Sensitive     VANCOMYCIN <=0.5 SENSITIVE Sensitive     TRIMETH/SULFA <=10 SENSITIVE Sensitive     CLINDAMYCIN >=8 RESISTANT Resistant     RIFAMPIN <=0.5 SENSITIVE Sensitive     Inducible Clindamycin NEGATIVE Sensitive     * MODERATE STAPHYLOCOCCUS AUREUS  Blood Culture (routine x 2)     Status: None (Preliminary result)   Collection Time: 10/10/16  3:00 PM  Result Value Ref Range Status   Specimen Description BLOOD LEFT ANTECUBITAL  Final   Special Requests BOTTLES DRAWN AEROBIC AND ANAEROBIC 10CC  Final   Culture NO GROWTH 2 DAYS  Final   Report Status PENDING  Incomplete  Blood Culture (routine x 2)     Status: None (Preliminary result)   Collection Time: 10/10/16  3:15 PM  Result Value Ref Range Status   Specimen Description BLOOD LEFT HAND  Final   Special Requests BOTTLES DRAWN AEROBIC AND ANAEROBIC 10CC  Final   Culture NO GROWTH 2 DAYS  Final   Report Status PENDING  Incomplete  Respiratory Panel by PCR     Status: Abnormal   Collection Time: 10/11/16  1:32 PM  Result Value Ref Range Status   Adenovirus NOT DETECTED NOT DETECTED Final   Coronavirus 229E NOT DETECTED NOT DETECTED Final   Coronavirus HKU1 NOT DETECTED NOT DETECTED Final   Coronavirus NL63 NOT DETECTED NOT DETECTED Final   Coronavirus OC43 NOT DETECTED NOT DETECTED Final   Metapneumovirus NOT DETECTED NOT DETECTED Final   Rhinovirus / Enterovirus NOT DETECTED NOT DETECTED Final   Influenza A H3 DETECTED (A) NOT DETECTED Final   Influenza B NOT DETECTED NOT DETECTED Final   Parainfluenza Virus 1 NOT DETECTED NOT DETECTED Final   Parainfluenza Virus 2 NOT DETECTED NOT DETECTED Final   Parainfluenza Virus 3 NOT DETECTED NOT DETECTED Final   Parainfluenza Virus 4 NOT DETECTED NOT DETECTED Final   Respiratory Syncytial Virus NOT DETECTED NOT DETECTED Final   Bordetella pertussis NOT DETECTED NOT DETECTED Final   Chlamydophila pneumoniae NOT DETECTED NOT DETECTED Final   Mycoplasma  pneumoniae NOT DETECTED NOT DETECTED Final         Radiology Studies: No results found.      Scheduled Meds: . aspirin EC  81 mg Oral Daily  . buPROPion  150 mg Oral BID  . enoxaparin (LOVENOX) injection  0.5 mg/kg Subcutaneous Q24H  . gabapentin  100 mg Oral TID  . guaiFENesin  600 mg Oral BID  . Influenza vac split quadrivalent PF  0.5 mL Intramuscular Tomorrow-1000  . insulin aspart  0-15 Units Subcutaneous TID WC  . insulin aspart protamine- aspart  30 Units Subcutaneous BID WC  . mupirocin cream   Topical Daily  . oseltamivir  75 mg Oral BID  . pravastatin  20 mg Oral q1800  . vancomycin  1,250 mg Intravenous Q12H   Continuous Infusions:    LOS: 3 days    Time spent: 12min    Domenic Polite, MD [facility] Pager [phone]  If 7PM-7AM, please contact night-coverage [email or web address] Password [email or web address] 10/13/2016, 2:41 PM

## 2016-10-13 NOTE — Care Management Note (Signed)
Case Management Note  Patient Details  Name: EFFREY KRAMARZ MRN: [id number] Date of Birth: April 18, 1966  Subjective/Objective:                  Sepsis due to cellulitis Action/Plan: Discharge planning Expected Discharge Date:                  Expected Discharge Plan:  Home/Self Care  In-House Referral:  NA  Discharge planning Services  CM Consult, [facility]  Post Acute Care Choice:    Choice offered to:  Patient  DME Arranged:  N/A DME Agency:  NA  HH Arranged:  NA HH Agency:  NA  Status of Service:  Completed, signed off  If discussed at [place] of Stay Meetings, dates discussed:    Additional Comments: CM spoke with pt who states he has [employer] and CM encouraged pt to look at the back of his insurance card and call the 888/866/800 number to get a list of PCPs within his network.  CM also placed [place] number on pt's AVS.  No other CM needs were communicated. Dellie Catholic, RN 10/13/2016, 9:01 AM

## 2016-10-14 LAB — BASIC METABOLIC PANEL
ANION GAP: 9 (ref 5–15)
BUN: 8 mg/dL (ref 6–20)
CHLORIDE: 98 mmol/L — AB (ref 101–111)
CO2: 28 mmol/L (ref 22–32)
Calcium: 8.6 mg/dL — ABNORMAL LOW (ref 8.9–10.3)
Creatinine, Ser: 0.46 mg/dL — ABNORMAL LOW (ref 0.61–1.24)
GFR calc Af Amer: >60 mL/min (ref >60)
GFR calc non Af Amer: >60 mL/min (ref >60)
GLUCOSE: 121 mg/dL — AB (ref 65–99)
POTASSIUM: 3.6 mmol/L (ref 3.5–5.1)
Sodium: 135 mmol/L (ref 135–145)

## 2016-10-14 LAB — GLUCOSE, CAPILLARY: Glucose-Capillary: 152 mg/dL — ABNORMAL HIGH (ref 65–99)

## 2016-10-14 MED ORDER — OSELTAMIVIR PHOSPHATE 75 MG PO CAPS: 75.0000 mg | ORAL_CAPSULE | Freq: Two times a day (BID) | ORAL | 0 refills | Status: DC

## 2016-10-14 MED ORDER — HYDROCODONE-ACETAMINOPHEN 5-325 MG PO TABS: 1.0000 | ORAL_TABLET | Freq: Four times a day (QID) | ORAL | 0 refills | Status: DC | PRN

## 2016-10-14 MED ORDER — DOXYCYCLINE HYCLATE 100 MG PO CAPS: 100.0000 mg | ORAL_CAPSULE | Freq: Two times a day (BID) | ORAL | 0 refills | Status: DC

## 2016-10-14 NOTE — Discharge Summary (Addendum)
Physician Discharge Summary  Cody Harper [id number] DOB: 08-07-1966 DOA: 10/10/2016  PCP: No PCP Per Patient  Admit date: 10/10/2016 Discharge date: 10/14/2016  Time spent: 45 minutes  Recommendations for Outpatient Follow-up:  [facility] 1/2 Wound care of R leg ulcer/chronic-would benefit from wound center referral   Discharge Diagnoses:    R leg cellulitis and venous ulcer   Sepsis   Influenza A   Diabetes type 2, uncontrolled ([place])   Obesity   Tobacco use disorder   Sepsis ([place])   Cellulitis   Discharge Condition: stable  Diet recommendation: diabetic  Filed Weights   10/12/16 1239  Weight: (!) 138.6 kg (305 lb 8 oz)    History of present illness:  Cody Harper a 51 y.o.malewith medical history significant of Uncontrolled DM, Tobacco abuse, DM neuropathy, 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  Hospital Course:  Sepsis due to cellulitis/diabetic leg ulcer -started Iv Vanc/Zosyn since diabetic with chronic wound -blood cx negative,  Continued Iv Vanc  -MRI without osteomyelitis -improved, wound Cx grew MSSA, but significance of this from a chronic wound is unclear, anyway he was transitioned to oral doxycycline at discharge -advised to keep this leg elevated  Influenza A -also had cough/congestion/bodyaches and tested for influenza which was positive -improved with tamiflu, supportive care  Diabetes type 2, uncontrolled ([place]) -last hba1c was 12.7, suspect poor compliance since CBGs in 150-170 range in hospital on his home regimen -has no PCP/CM consulted, made FU with [facility] -continue Insulin 70/30 at same dose at discharge  Obesity  Tobacco use disorder -counseled, nicotine patch  DM  neuropathy -resumed gabapentin  Discharge Exam: Vitals:   10/13/16 2100 10/14/16 0601  BP: 131/80 123/74  Pulse: 70 66  Resp: 19 19  Temp: 98.4 F (36.9 C) 98.1 F (36.7 C)    General: AAOx3 Cardiovascular: S1S2/RRR Respiratory: CTAB  Discharge Instructions   Discharge Instructions    Diet - low sodium heart healthy    Complete by:  As directed    Diet Carb Modified    Complete by:  As directed    Increase activity slowly    Complete by:  As directed      Current Discharge Medication List    START taking these medications   Details  HYDROcodone-acetaminophen (NORCO/VICODIN) 5-325 MG tablet Take 1 tablet by mouth every 6 (six) hours as needed for severe pain. Qty: 20 tablet, Refills: 0    oseltamivir (TAMIFLU) 75 MG capsule Take 1 capsule (75 mg total) by mouth 2 (two) times daily. For 2days Qty: 4 capsule, Refills: 0      CONTINUE these medications which have CHANGED   Details  doxycycline (VIBRAMYCIN) 100 MG capsule Take 1 capsule (100 mg total) by mouth 2 (two) times daily. For 7days Qty: 14 capsule, Refills: 0      CONTINUE these medications which have NOT CHANGED   Details  insulin aspart protamine- aspart (NOVOLOG MIX 70/30) (70-30) 100 UNIT/ML injection Inject 0.3 mLs (30 Units total) into the skin 2 (two) times daily with a meal. Qty: 10 mL, Refills: 0    aspirin EC 81 MG tablet Take 1 tablet (81 mg total) by mouth daily.    gabapentin (NEURONTIN) 100 MG capsule Take 1 capsule (100  mg total) by mouth 3 (three) times daily. Qty: 90 capsule, Refills: 3   Associated Diagnoses: Other diabetic neurological complication associated with type 2 diabetes mellitus (HCC)    metFORMIN (GLUCOPHAGE) 1000 MG tablet Take 1 tablet (1,000 mg total) by mouth 2 (two) times daily with a meal. Qty: 60 tablet, Refills: 6    pravastatin (PRAVACHOL) 20 MG tablet Take 1 tablet (20 mg total) by mouth daily. Qty: 30 tablet, Refills: 3   Associated Diagnoses: Hyperlipidemia       STOP taking these medications     buPROPion (WELLBUTRIN SR) 150 MG 12 hr tablet        No Known Allergies Follow-up Information    [facility] Follow up.   Why:  Please call for an appointment 10/15/2016 Contact information: [place] [phone]       [facility] Follow up.   Why:  CALL THIS NUMBER; FOLLOW THE PROMPTS TO GET A PRIMARY CARE PHYSICIAN Contact information: [phone]           The results of significant diagnostics from this hospitalization (including imaging, microbiology, ancillary and laboratory) are listed below for reference.    Significant Diagnostic Studies: Dg Chest 2 View  Result Date: 10/10/2016 CLINICAL DATA:  Right leg swelling infection. Dry cough and fever. Headache and dizziness. EXAM: CHEST  2 VIEW COMPARISON:  Two-view chest x-ray 10/10/2016. FINDINGS: Low lung volumes exaggerate the heart size and pulmonary interstitium. No focal airspace disease present. There is no edema or effusion. IMPRESSION: No active cardiopulmonary disease. Electronically Signed   By: San Morelle M.D.   On: 10/10/2016 14:35   Dg Tibia/fibula Right  Result Date: 10/10/2016 CLINICAL DATA:  Right lower leg pain. EXAM: RIGHT TIBIA AND FIBULA - 2 VIEW COMPARISON:  11/11/2014 FINDINGS: There is diffuse soft tissue swelling. Calcified phleboliths are identified along the medial aspect of the lower leg. No acute fractures or subluxations. IMPRESSION: 1. No acute bone abnormalities. 2. Soft tissue swelling. Electronically Signed   By: Kerby Moors M.D.   On: 10/10/2016 16:11   Mr Tibia Fibula Right W Wo Contrast  Result Date: 10/10/2016 CLINICAL DATA:  Uncontrolled diabetic with right leg pain, swelling and fever. EXAM: MRI OF LOWER RIGHT EXTREMITY WITHOUT AND WITH CONTRAST TECHNIQUE: Multiplanar, multisequence MR imaging of the right leg was performed both before and after  administration of intravenous contrast. CONTRAST:  37mL MULTIHANCE GADOBENATE DIMEGLUMINE 529 MG/ML IV SOLN COMPARISON:  None. FINDINGS: Bones/Joint/Cartilage No findings of osteomyelitis.  No fracture bone destruction. Ligaments Negative Muscles and Tendons No intramuscular mass, abscess or hematoma. Soft tissues Soft tissue edema swelling of the right leg consistent with cellulitis. Superficial varicose veins of the right leg medially. IMPRESSION: Cellulitis of the right leg without evidence of osteomyelitis. Electronically Signed   By: Ashley Royalty M.D.   On: 10/10/2016 22:37    Microbiology: Recent Results (from the past 240 hour(s))  Urine culture     Status: None   Collection Time: 10/10/16  2:39 PM  Result Value Ref Range Status   Specimen Description URINE, RANDOM  Final   Special Requests NONE  Final   Culture NO GROWTH  Final   Report Status 10/12/2016 FINAL  Final  Wound or Superficial Culture     Status: None   Collection Time: 10/10/16  2:44 PM  Result Value Ref Range Status   Specimen Description WOUND ULCER  Final   Special Requests NONE  Final   Gram Stain   Final    RARE WBC PRESENT, PREDOMINANTLY PMN FEW GRAM POSITIVE COCCI IN PAIRS    Culture   Final    MODERATE STAPHYLOCOCCUS AUREUS FEW STREPTOCOCCUS GROUP G    Report Status 10/13/2016 FINAL  Final   Organism ID, Bacteria STAPHYLOCOCCUS AUREUS  Final      Susceptibility   Staphylococcus aureus - MIC*    CIPROFLOXACIN <=0.5 SENSITIVE Sensitive     ERYTHROMYCIN >=8 RESISTANT Resistant     GENTAMICIN <=0.5 SENSITIVE Sensitive     OXACILLIN <=0.25 SENSITIVE Sensitive     TETRACYCLINE <=1 SENSITIVE Sensitive     VANCOMYCIN <=0.5 SENSITIVE Sensitive     TRIMETH/SULFA <=10 SENSITIVE Sensitive     CLINDAMYCIN >=8 RESISTANT Resistant     RIFAMPIN <=0.5 SENSITIVE Sensitive     Inducible Clindamycin NEGATIVE Sensitive     * MODERATE STAPHYLOCOCCUS AUREUS  Blood Culture (routine x 2)     Status: None (Preliminary  result)   Collection Time: 10/10/16  3:00 PM  Result Value Ref Range Status   Specimen Description BLOOD LEFT ANTECUBITAL  Final   Special Requests BOTTLES DRAWN AEROBIC AND ANAEROBIC 10CC  Final   Culture NO GROWTH 3 DAYS  Final   Report Status PENDING  Incomplete  Blood Culture (routine x 2)     Status: None (Preliminary result)   Collection Time: 10/10/16  3:15 PM  Result Value Ref Range Status   Specimen Description BLOOD LEFT HAND  Final   Special Requests BOTTLES DRAWN AEROBIC AND ANAEROBIC 10CC  Final   Culture NO GROWTH 3 DAYS  Final   Report Status PENDING  Incomplete  Respiratory Panel by PCR     Status: Abnormal   Collection Time: 10/11/16  1:32 PM  Result Value Ref Range Status   Adenovirus NOT DETECTED NOT DETECTED Final   Coronavirus 229E NOT DETECTED NOT DETECTED Final   Coronavirus HKU1 NOT DETECTED NOT DETECTED Final   Coronavirus NL63 NOT DETECTED NOT DETECTED Final   Coronavirus OC43 NOT DETECTED NOT DETECTED Final   Metapneumovirus NOT DETECTED NOT DETECTED Final   Rhinovirus / Enterovirus NOT DETECTED NOT DETECTED Final   Influenza A H3 DETECTED (A) NOT DETECTED Final   Influenza B NOT DETECTED NOT DETECTED Final   Parainfluenza Virus 1 NOT DETECTED NOT DETECTED Final   Parainfluenza Virus 2 NOT DETECTED NOT DETECTED Final   Parainfluenza Virus 3 NOT DETECTED NOT DETECTED Final   Parainfluenza Virus 4 NOT DETECTED NOT DETECTED Final   Respiratory Syncytial Virus NOT DETECTED NOT DETECTED Final   Bordetella pertussis NOT DETECTED NOT DETECTED Final   Chlamydophila pneumoniae NOT DETECTED NOT DETECTED Final   Mycoplasma pneumoniae NOT DETECTED NOT DETECTED Final     Labs: Basic Metabolic Panel:  Recent Labs Lab 10/10/16 1640 10/11/16 0501 10/13/16 1217 10/14/16 0333  NA 134* 132* 132* 135  K 3.7 3.6 3.6 3.6  CL 98* 98* 97* 98*  CO2 25 25 27 28   GLUCOSE 265* 171* 170* 121*  BUN 13 12 10 8   CREATININE 0.97 0.85 0.64 0.46*  CALCIUM 8.5* 8.3* 8.8*  8.6*   Liver Function Tests:  Recent Labs Lab 10/10/16 1640  AST 14*  ALT 13*  ALKPHOS 79  BILITOT 0.6  PROT 6.8  ALBUMIN 3.0*   No results for input(s): LIPASE, AMYLASE in the last 168 hours. No results for input(s): AMMONIA in the last 168 hours. CBC:  Recent Labs Lab 10/10/16 1314 10/11/16 0501  10/13/16 1217  WBC 16.2* 12.4* 8.3  NEUTROABS 13.4*  --   --   HGB 15.3 13.4 12.7*  HCT 42.6 38.7* 37.5*  MCV 84.4 85.1 86.8  PLT 232 194 236   Cardiac Enzymes: No results for input(s): CKTOTAL, CKMB, CKMBINDEX, TROPONINI in the last 168 hours. BNP: BNP (last 3 results) No results for input(s): BNP in the last 8760 hours.  ProBNP (last 3 results) No results for input(s): PROBNP in the last 8760 hours.  CBG:  Recent Labs Lab 10/13/16 0752 10/13/16 1132 10/13/16 1631 10/13/16 2159 10/14/16 0742  GLUCAP 148* 178* 231* 200* 152*       SignedDomenic Polite MD.  [facility] 10/14/2016, 10:08 AM

## 2016-10-14 NOTE — Progress Notes (Signed)
Patient discharged home in stable condition. Verbalizes understanding of all discharge instructions, including home medications and follow up appointments. 

## 2016-10-15 LAB — CULTURE, BLOOD (ROUTINE X 2)
CULTURE: NO GROWTH
Culture: NO GROWTH

## 2016-10-23 ENCOUNTER — Ambulatory Visit: Payer: 59 | Attending: Internal Medicine | Admitting: Physician Assistant

## 2016-10-23 VITALS — BP 138/86 | HR 82 | Temp 98.0°F | Ht 72.0 in | Wt 288.8 lb

## 2016-10-23 DIAGNOSIS — I83891 Varicose veins of right lower extremities with other complications: Secondary | ICD-10-CM | POA: Diagnosis not present

## 2016-10-23 DIAGNOSIS — E118 Type 2 diabetes mellitus with unspecified complications: Secondary | ICD-10-CM | POA: Diagnosis not present

## 2016-10-23 DIAGNOSIS — E785 Hyperlipidemia, unspecified: Secondary | ICD-10-CM | POA: Diagnosis not present

## 2016-10-23 DIAGNOSIS — Z794 Long term (current) use of insulin: Secondary | ICD-10-CM | POA: Diagnosis not present

## 2016-10-23 DIAGNOSIS — E11621 Type 2 diabetes mellitus with foot ulcer: Secondary | ICD-10-CM | POA: Diagnosis not present

## 2016-10-23 DIAGNOSIS — E1165 Type 2 diabetes mellitus with hyperglycemia: Secondary | ICD-10-CM | POA: Insufficient documentation

## 2016-10-23 DIAGNOSIS — E669 Obesity, unspecified: Secondary | ICD-10-CM | POA: Diagnosis not present

## 2016-10-23 DIAGNOSIS — E1140 Type 2 diabetes mellitus with diabetic neuropathy, unspecified: Secondary | ICD-10-CM | POA: Insufficient documentation

## 2016-10-23 DIAGNOSIS — Z7982 Long term (current) use of aspirin: Secondary | ICD-10-CM | POA: Diagnosis not present

## 2016-10-23 DIAGNOSIS — L97519 Non-pressure chronic ulcer of other part of right foot with unspecified severity: Secondary | ICD-10-CM | POA: Diagnosis not present

## 2016-10-23 DIAGNOSIS — E1149 Type 2 diabetes mellitus with other diabetic neurological complication: Secondary | ICD-10-CM

## 2016-10-23 DIAGNOSIS — I872 Venous insufficiency (chronic) (peripheral): Secondary | ICD-10-CM

## 2016-10-23 DIAGNOSIS — Z86718 Personal history of other venous thrombosis and embolism: Secondary | ICD-10-CM | POA: Insufficient documentation

## 2016-10-23 DIAGNOSIS — L03115 Cellulitis of right lower limb: Secondary | ICD-10-CM | POA: Diagnosis not present

## 2016-10-23 DIAGNOSIS — F17210 Nicotine dependence, cigarettes, uncomplicated: Secondary | ICD-10-CM | POA: Insufficient documentation

## 2016-10-23 DIAGNOSIS — Z6839 Body mass index (BMI) 39.0-39.9, adult: Secondary | ICD-10-CM | POA: Diagnosis not present

## 2016-10-23 LAB — GLUCOSE, POCT (MANUAL RESULT ENTRY): POC Glucose: 344 mg/dl — AB (ref 70–99)

## 2016-10-23 MED ORDER — CEPHALEXIN 500 MG PO CAPS: 500.0000 mg | ORAL_CAPSULE | Freq: Four times a day (QID) | ORAL | 0 refills | Status: AC

## 2016-10-23 MED ORDER — INSULIN ASPART PROT & ASPART (70-30 MIX) 100 UNIT/ML ~~LOC~~ SUSP: 32.0000 [IU] | Freq: Two times a day (BID) | SUBCUTANEOUS | 1 refills | Status: DC

## 2016-10-23 MED ORDER — GABAPENTIN 300 MG PO CAPS: 100.0000 mg | ORAL_CAPSULE | Freq: Two times a day (BID) | ORAL | 1 refills | Status: DC

## 2016-10-23 NOTE — Progress Notes (Signed)
Cody Harper  [id number]  [id number]  DOB - 08/17/1966  Chief Complaint  Patient presents with  . Leg Pain  . Hospitalization Follow-up       Subjective:   Cody Harper is a 51 y.o. male here today for reestablishment of care. He was last seen here in 2016. He has a history of diabetes mellitus type 2 with diabetic neuropathy, hair, studies, hyperlipidemia, abnormal TSH, chronic right lower extremity wound and he is a smoker. He presented to the hospital on 10/10/2016 with fever, chills and right leg pain and swelling. He is very tender and had had symptoms for at least 2-3 days prior to treatment. He was diagnosed with cellulitis. He was treated with IV vancomycin and Zosyn. His blood cultures were negative. An MRI ruled out osteomyelitis. His antibiotics was changed to doxycycline at discharge. His hospital course was complicated by influenza type a and elevated blood sugars. He had his home diabetes medication refill prior to dismissal. Again he went home on oral antibiotics. He has not had any wound care. He has been back to work and is having some issues with continued swelling and pain in his right lower extremity. He feels like he walks some pins and needles. He has a constant burning in the right foot. He continues to smoke one pack per day of cigarettes. No more fevers.   ROS: GEN: denies fever or chills, denies change in weight Skin: denies lesions or rashes HEENT: denies headache, earache, epistaxis, sore throat, or neck pain LUNGS: denies SHOB, dyspnea, PND, orthopnea CV: denies CP or palpitations ABD: denies abd pain, N or V EXT: denies muscle spasms or swelling; no pain in lower ext, no weakness NEURO: + numbness or tingling, denies sz, stroke or TIA   ALLERGIES: No Known Allergies  PAST MEDICAL HISTORY: Past Medical History:  Diagnosis Date  . Arthritis    "fingers" (10/11/2016)  . Cellulitis of right leg 11/12/2014; 10/11/2016  . Diabetes type 2,  uncontrolled ([place]) 03/20/2011  . Diabetic foot ulcer ([place]) 03/20/2011  . Elevated TSH 11/13/2014  . Obesity, unspecified 05/31/2014  . Other and unspecified hyperlipidemia 05/31/2014  . Pneumonia 2016  . Superficial vein thrombosis 06/01/2014  . Syncope 05/30/2014  . Tendinitis of elbow or forearm 11/13/2014  . Tobacco use disorder 05/31/2014  . 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 Vein, varicose 03/20/2011    PAST SURGICAL HISTORY: Past Surgical History:  Procedure Laterality Date  . APPENDECTOMY      MEDICATIONS AT HOME: Prior to Admission medications   Medication Sig Start Date End Date Taking? Authorizing Provider  aspirin EC 81 MG tablet Take 1 tablet (81 mg total) by mouth daily. 06/01/14  Yes Delfina Redwood, MD  gabapentin (NEURONTIN) 300 MG capsule Take 1 capsule (300 mg total) by mouth 2 (two) times daily. 10/23/16  Yes Arine Foley Daneil Dan, PA-C  HYDROcodone-acetaminophen (NORCO/VICODIN) 5-325 MG tablet Take 1 tablet by mouth every 6 (six) hours as needed for severe pain. 10/14/16  Yes Domenic Polite, MD  insulin aspart protamine- aspart (NOVOLOG MIX 70/30) (70-30) 100 UNIT/ML injection Inject 0.32 mLs (32 Units total) into the skin 2 (two) times daily with a meal. 10/23/16  Yes Jonathyn Carothers S Trinette Vera, PA-C  metFORMIN (GLUCOPHAGE) 1000 MG tablet Take 1 tablet (1,000 mg total) by mouth 2 (two) times daily with a meal. 11/13/14  Yes Christina P Rama, MD  pravastatin (PRAVACHOL) 20 MG tablet Take 1 tablet (20 mg  total) by mouth daily. 12/05/14  Yes Lorayne Marek, MD  cephALEXin (KEFLEX) 500 MG capsule Take 1 capsule (500 mg total) by mouth 4 (four) times daily. 10/23/16 11/02/16  Brayton Caves, PA-C     Objective:   Vitals:   10/23/16 1338  BP: 138/86  Pulse: 82  Temp: 98 F (36.7 C)  TempSrc: Oral  SpO2: 97%  Weight: 288 lb 12.8 oz (131 kg)  Height: 6' (1.829 m)    Exam:AFEBRILE General appearance : Awake, alert, not in any distress. Speech Clear. Not toxic  looking HEENT: Atraumatic and Normocephalic, pupils equally reactive to light and accomodation Neck: supple, no JVD. No cervical lymphadenopathy.  Chest:Good air entry bilaterally, no added sounds  CVS: S1 S2 regular, no murmurs.  Abdomen: Bowel sounds present, Non tender and not distended with no gaurding, rigidity or rebound. Extremities: right leg with edema and varicosities; venous stasis, warm  Neurology: Awake alert, and oriented X 3, CN II-XII intact, Non focal Skin:No Rash Wounds:N/A  Data Review Lab Results  Component Value Date   HGBA1C 11.4 (H) 10/10/2016   HGBA1C 12.7 (H) 08/06/2016   HGBA1C 11.8 (H) 11/12/2014     Assessment & Plan  1. RLE cellulitis  -Keflex, elevation  -watch for fevers; low threshold to re-hospitalize 2. Varicosities/venous stasis  -referral to vascular surgery 3. DM2 with hyperglycemia  -Increase 70/30 32 U BID  -Aim for 30 minutes of exercise most days. Rethink what you drink. Water is great! Aim for 2-3 Carb Choices per meal (30-45 grams) +/- 1 either way  Aim for 0-15 Carbs per snack if hungry  Include protein in moderation with your meals and snacks  Consider reading food labels for Total Carbohydrate and Fat Grams of foods  Consider checking BG at alternate times per day  Continue taking medication as directed Be mindful about how much sugar you are adding to beverages and other foods. Fruit Punch - find one with no sugar  Measure and decrease portions of carbohydrate foods  Make your plate and don't go back for seconds  -keep log and bring to next appt 4. Diabetic neuropathy  -Neurontin  Return in about 1 week (around 10/30/2016).Low threshold to hospitalize if no improvement.  The patient was given clear instructions to go to ER or return to medical center if symptoms don't improve, worsen or new problems develop. The patient verbalized understanding. The patient was told to call to get lab results if they haven't heard  anything in the next week.   This note has been created with Surveyor, quantity. Any transcriptional errors are unintentional.    Zettie Pho, PA-C [facility] [place], [place]   10/23/2016, 3:04 PM

## 2016-11-04 ENCOUNTER — Encounter: Payer: Self-pay | Admitting: Internal Medicine

## 2016-11-04 ENCOUNTER — Ambulatory Visit: Payer: 59 | Attending: Internal Medicine | Admitting: Internal Medicine

## 2016-11-04 VITALS — BP 154/96 | HR 72 | Temp 98.1°F | Resp 16 | Wt 293.4 lb

## 2016-11-04 DIAGNOSIS — E11621 Type 2 diabetes mellitus with foot ulcer: Secondary | ICD-10-CM | POA: Diagnosis not present

## 2016-11-04 DIAGNOSIS — E1149 Type 2 diabetes mellitus with other diabetic neurological complication: Secondary | ICD-10-CM | POA: Diagnosis not present

## 2016-11-04 DIAGNOSIS — Z794 Long term (current) use of insulin: Secondary | ICD-10-CM | POA: Diagnosis not present

## 2016-11-04 DIAGNOSIS — Z803 Family history of malignant neoplasm of breast: Secondary | ICD-10-CM | POA: Diagnosis not present

## 2016-11-04 DIAGNOSIS — E1151 Type 2 diabetes mellitus with diabetic peripheral angiopathy without gangrene: Secondary | ICD-10-CM | POA: Insufficient documentation

## 2016-11-04 DIAGNOSIS — Z8249 Family history of ischemic heart disease and other diseases of the circulatory system: Secondary | ICD-10-CM | POA: Insufficient documentation

## 2016-11-04 DIAGNOSIS — E1142 Type 2 diabetes mellitus with diabetic polyneuropathy: Secondary | ICD-10-CM | POA: Diagnosis present

## 2016-11-04 DIAGNOSIS — E669 Obesity, unspecified: Secondary | ICD-10-CM | POA: Insufficient documentation

## 2016-11-04 DIAGNOSIS — F17210 Nicotine dependence, cigarettes, uncomplicated: Secondary | ICD-10-CM | POA: Diagnosis not present

## 2016-11-04 DIAGNOSIS — IMO0002 Reserved for concepts with insufficient information to code with codable children: Secondary | ICD-10-CM

## 2016-11-04 DIAGNOSIS — Z125 Encounter for screening for malignant neoplasm of prostate: Secondary | ICD-10-CM | POA: Insufficient documentation

## 2016-11-04 DIAGNOSIS — Z23 Encounter for immunization: Secondary | ICD-10-CM

## 2016-11-04 DIAGNOSIS — Z1211 Encounter for screening for malignant neoplasm of colon: Secondary | ICD-10-CM

## 2016-11-04 DIAGNOSIS — I10 Essential (primary) hypertension: Secondary | ICD-10-CM | POA: Diagnosis not present

## 2016-11-04 DIAGNOSIS — E1165 Type 2 diabetes mellitus with hyperglycemia: Secondary | ICD-10-CM | POA: Diagnosis not present

## 2016-11-04 DIAGNOSIS — F17200 Nicotine dependence, unspecified, uncomplicated: Secondary | ICD-10-CM

## 2016-11-04 DIAGNOSIS — E785 Hyperlipidemia, unspecified: Secondary | ICD-10-CM | POA: Insufficient documentation

## 2016-11-04 LAB — LIPID PANEL
CHOL/HDL RATIO: 5.7 ratio — AB (ref <5.0)
Cholesterol: 205 mg/dL — ABNORMAL HIGH (ref <200)
HDL: 36 mg/dL — AB (ref >40)
LDL CALC: 131 mg/dL — AB (ref <100)
Triglycerides: 188 mg/dL — ABNORMAL HIGH (ref <150)
VLDL: 38 mg/dL — AB (ref <30)

## 2016-11-04 LAB — PSA: PSA: 0.4 ng/mL (ref <=4.0)

## 2016-11-04 LAB — GLUCOSE, POCT (MANUAL RESULT ENTRY): POC GLUCOSE: 239 mg/dL — AB (ref 70–99)

## 2016-11-04 MED ORDER — METFORMIN HCL 1000 MG PO TABS: 1000.0000 mg | ORAL_TABLET | Freq: Two times a day (BID) | ORAL | 6 refills | Status: AC

## 2016-11-04 MED ORDER — GABAPENTIN 300 MG PO CAPS: 300.0000 mg | ORAL_CAPSULE | Freq: Three times a day (TID) | ORAL | 3 refills | Status: DC

## 2016-11-04 MED ORDER — LISINOPRIL-HYDROCHLOROTHIAZIDE 10-12.5 MG PO TABS: 1.0000 | ORAL_TABLET | Freq: Every day | ORAL | 3 refills | Status: AC

## 2016-11-04 MED ORDER — METFORMIN HCL 1000 MG PO TABS: 1000.0000 mg | ORAL_TABLET | Freq: Two times a day (BID) | ORAL | 6 refills | Status: DC

## 2016-11-04 MED ORDER — ASPIRIN EC 81 MG PO TBEC: 81.0000 mg | DELAYED_RELEASE_TABLET | Freq: Every day | ORAL | Status: AC

## 2016-11-04 MED ORDER — INSULIN ASPART PROT & ASPART (70-30 MIX) 100 UNIT/ML ~~LOC~~ SUSP: 38.0000 [IU] | Freq: Two times a day (BID) | SUBCUTANEOUS | 3 refills | Status: AC

## 2016-11-04 MED ORDER — PRAVASTATIN SODIUM 20 MG PO TABS: 20.0000 mg | ORAL_TABLET | Freq: Every day | ORAL | 3 refills | Status: DC

## 2016-11-04 NOTE — Patient Instructions (Addendum)
- [facility] 2 wks /dm chk. - RN [facility] bp check 2 wks -   Check blood sugars on waking up daily., and at least 1-2 times rest of day.    Also check blood sugars about 2 hours after a meal and do this after different meals by rotation  Recommended blood sugar levels on waking up is 90-130 and about 2 hours after meal is 130-160  Please bring your blood sugar monitor to each visit, thank you  Restart exercise  Continue cholesterol med.  -- please call us if you have any questions  -  Diabetes Mellitus and Food It is important for you to manage your blood sugar (glucose) level. Your blood glucose level can be greatly affected by what you eat. Eating healthier foods in the appropriate amounts throughout the day at about the same time each day will help you control your blood glucose level. It can also help slow or prevent worsening of your diabetes mellitus. Healthy eating may even help you improve the level of your blood pressure and reach or maintain a healthy weight. General recommendations for healthful eating and cooking habits include:  Eating meals and snacks regularly. Avoid going long periods of time without eating to lose weight.  Eating a diet that consists mainly of plant-based foods, such as fruits, vegetables, nuts, legumes, and whole grains.  Using low-heat cooking methods, such as baking, instead of high-heat cooking methods, such as deep frying. Work with your dietitian to make sure you understand how to use the Nutrition Facts information on food labels. How can food affect me? Carbohydrates  Carbohydrates affect your blood glucose level more than any other type of food. Your dietitian will help you determine how many carbohydrates to eat at each meal and teach you how to count carbohydrates. Counting carbohydrates is important to keep your blood glucose at a healthy level, especially if you are using insulin or taking certain medicines for diabetes  mellitus. Alcohol  Alcohol can cause sudden decreases in blood glucose (hypoglycemia), especially if you use insulin or take certain medicines for diabetes mellitus. Hypoglycemia can be a life-threatening condition. Symptoms of hypoglycemia (sleepiness, dizziness, and disorientation) are similar to symptoms of having too much alcohol. If your health care provider has given you approval to drink alcohol, do so in moderation and use the following guidelines:  Women should not have more than one drink per day, and men should not have more than two drinks per day. One drink is equal to:  12 oz of beer.  5 oz of wine.  1 oz of hard liquor.  Do not drink on an empty stomach.  Keep yourself hydrated. Have water, diet soda, or unsweetened iced tea.  Regular soda, juice, and other mixers might contain a lot of carbohydrates and should be counted. What foods are not recommended? As you make food choices, it is important to remember that all foods are not the same. Some foods have fewer nutrients per serving than other foods, even though they might have the same number of calories or carbohydrates. It is difficult to get your body what it needs when you eat foods with fewer nutrients. Examples of foods that you should avoid that are high in calories and carbohydrates but low in nutrients include:  Trans fats (most processed foods list trans fats on the Nutrition Facts label).  Regular soda.  Juice.  Candy.  Sweets, such as cake, pie, doughnuts, and cookies.  Fried foods. What foods  can I eat? Eat nutrient-rich foods, which will nourish your body and keep you healthy. The food you should eat also will depend on several factors, including:  The calories you need.  The medicines you take.  Your weight.  Your blood glucose level.  Your blood pressure level.  Your cholesterol level. You should eat a variety of foods, including:  Protein.  Lean cuts of meat.  Proteins low in  saturated fats, such as fish, egg whites, and beans. Avoid processed meats.  Fruits and vegetables.  Fruits and vegetables that may help control blood glucose levels, such as apples, mangoes, and yams.  Dairy products.  Choose fat-free or low-fat dairy products, such as milk, yogurt, and cheese.  Grains, bread, pasta, and rice.  Choose whole grain products, such as multigrain bread, whole oats, and brown rice. These foods may help control blood pressure.  Fats.  Foods containing healthful fats, such as nuts, avocado, olive oil, canola oil, and fish. Does everyone with diabetes mellitus have the same meal plan? Because every person with diabetes mellitus is different, there is not one meal plan that works for everyone. It is very important that you meet with a dietitian who will help you create a meal plan that is just right for you. This information is not intended to replace advice given to you by your health care provider. Make sure you discuss any questions you have with your health care provider. Document Released: 06/27/2005 Document Revised: 03/07/2016 Document Reviewed: 08/27/2013 Elsevier Interactive Patient Education  2017 [place] for Eating Away From Home If You Have Diabetes Controlling your level of blood glucose, also known as blood sugar, can be challenging. It can be even more difficult when you do not prepare your own meals. The following tips can help you manage your diabetes when you eat away from home. Planning ahead Plan ahead if you know you will be eating away from home:  Ask your health care provider how to time meals and medicine if you are taking insulin.  Make a list of restaurants near you that offer healthy choices. If they have a carry-out menu, take it home and plan what you will order ahead of time.  Look up the restaurant you want to eat at online. Many chain and fast-food restaurants list nutritional information online. Use this  information to choose the healthiest options and to calculate how many carbohydrates will be in your meal.  Use a carbohydrate-counting book or mobile app to look up the carbohydrate content and serving size of the foods you want to eat.  Become familiar with serving sizes and learn to recognize how many servings are in a portion. This will allow you to estimate how many carbohydrates you can eat. Free foods A "free food" is any food or drink that has less than 5 g of carbohydrates per serving. Free foods include:  Many vegetables.  Hard boiled eggs.  Nuts or seeds.  Olives.  Cheeses.  Meats. These types of foods make good appetizer choices and are often available at salad bars. Lemon juice, vinegar, or a low-calorie salad dressing of fewer than 20 calories per serving can be used as a "free" salad dressing. Choices to reduce carbohydrates  Substitute nonfat sweetened yogurt with a sugar-free yogurt. Yogurt made from soy milk may also be used, but you will still want a sugar-free or plain option to choose a lower carbohydrate amount.  Ask your server to take away the  bread basket or chips from your table.  Order fresh fruit. A salad bar often offers fresh fruit choices. Avoid canned fruit because it is usually packed in sugar or syrup.  Order a salad, and eat it without dressing. Or, create a "free" salad dressing.  Ask for substitutions. For example, instead of [place] fries, request an order of a vegetable such as salad, green beans, or broccoli. Other tips  If you take insulin, take the insulin once your food arrives to your table. This will ensure your insulin and food are timed correctly.  Ask your server about the portion size before your order, and ask for a take-out box if the portion has more servings than you should have. When your food comes, leave the amount you should have on the plate, and put the rest in the take-out box.  Consider splitting an entree with someone  and ordering a side salad. This information is not intended to replace advice given to you by your health care provider. Make sure you discuss any questions you have with your health care provider. Document Released: 09/30/2005 Document Revised: 03/07/2016 Document Reviewed: 12/28/2013 Elsevier Interactive Patient Education  2017 [place].   -  Diabetes Mellitus and Exercise Exercising regularly is important for your overall health, especially when you have diabetes (diabetes mellitus). Exercising is not only about losing weight. It has many health benefits, such as increasing muscle strength and bone density and reducing body fat and stress. This leads to improved fitness, flexibility, and endurance, all of which result in better overall health. Exercise has additional benefits for people with diabetes, including:  Reducing appetite.  Helping to lower and control blood glucose.  Lowering blood pressure.  Helping to control amounts of fatty substances (lipids) in the blood, such as cholesterol and triglycerides.  Helping the body to respond better to insulin (improving insulin sensitivity).  Reducing how much insulin the body needs.  Decreasing the risk for heart disease by:  Lowering cholesterol and triglyceride levels.  Increasing the levels of good cholesterol.  Lowering blood glucose levels. What is my activity plan? Your health care provider or certified diabetes educator can help you make a plan for the type and frequency of exercise (activity plan) that works for you. Make sure that you:  Do at least 150 minutes of moderate-intensity or vigorous-intensity exercise each week. This could be brisk walking, biking, or water aerobics.  Do stretching and strength exercises, such as yoga or weightlifting, at least 2 times a week.  Spread out your activity over at least 3 days of the week.  Get some form of physical activity every day.  Do not go more than 2 days in a  row without some kind of physical activity.  Avoid being inactive for more than 90 minutes at a time. Take frequent breaks to walk or stretch.  Choose a type of exercise or activity that you enjoy, and set realistic goals.  Start slowly, and gradually increase the intensity of your exercise over time. What do I need to know about managing my diabetes?  Check your blood glucose before and after exercising.  If your blood glucose is higher than 240 mg/dL (13.3 mmol/L) before you exercise, check your urine for ketones. If you have ketones in your urine, do not exercise until your blood glucose returns to normal.  Know the symptoms of low blood glucose (hypoglycemia) and how to treat it. Your risk for hypoglycemia increases during and after exercise. Common symptoms of hypoglycemia  can include:  Hunger.  Anxiety.  Sweating and feeling clammy.  Confusion.  Dizziness or feeling light-headed.  Increased heart rate or palpitations.  Blurry vision.  Tingling or numbness around the mouth, lips, or tongue.  Tremors or shakes.  Irritability.  Keep a rapid-acting carbohydrate snack available before, during, and after exercise to help prevent or treat hypoglycemia.  Avoid injecting insulin into areas of the body that are going to be exercised. For example, avoid injecting insulin into:  The arms, when playing tennis.  The legs, when jogging.  Keep records of your exercise habits. Doing this can help you and your health care provider adjust your diabetes management plan as needed. Write down:  Food that you eat before and after you exercise.  Blood glucose levels before and after you exercise.  The type and amount of exercise you have done.  When your insulin is expected to peak, if you use insulin. Avoid exercising at times when your insulin is peaking.  When you start a new exercise or activity, work with your health care provider to make sure the activity is safe for you, and  to adjust your insulin, medicines, or food intake as needed.  Drink plenty of water while you exercise to prevent dehydration or heat stroke. Drink enough fluid to keep your urine clear or pale yellow. This information is not intended to replace advice given to you by your health care provider. Make sure you discuss any questions you have with your health care provider. Document Released: 12/21/2003 Document Revised: 04/19/2016 Document Reviewed: 03/11/2016 Elsevier Interactive Patient Education  2017 [employer].  -  Steps to Quit Smoking Smoking tobacco can be bad for your health. It can also affect almost every organ in your body. Smoking puts you and people around you at risk for many serious long-lasting (chronic) diseases. Quitting smoking is hard, but it is one of the best things that you can do for your health. It is never too late to quit. What are the benefits of quitting smoking? When you quit smoking, you lower your risk for getting serious diseases and conditions. They can include:  Lung cancer or lung disease.  Heart disease.  Stroke.  Heart attack.  Not being able to have children (infertility).  Weak bones (osteoporosis) and broken bones (fractures). If you have coughing, wheezing, and shortness of breath, those symptoms may get better when you quit. You may also get sick less often. If you are pregnant, quitting smoking can help to lower your chances of having a baby of low birth weight. What can I do to help me quit smoking? Talk with your doctor about what can help you quit smoking. Some things you can do (strategies) include:  Quitting smoking totally, instead of slowly cutting back how much you smoke over a period of time.  Going to in-person counseling. You are more likely to quit if you go to many counseling sessions.  Using resources and support systems, such as:  Online chats with a Social worker.  Phone quitlines.  Printed Furniture conservator/restorer.  Support  groups or group counseling.  Text messaging programs.  Mobile phone apps or applications.  Taking medicines. Some of these medicines may have nicotine in them. If you are pregnant or breastfeeding, do not take any medicines to quit smoking unless your doctor says it is okay. Talk with your doctor about counseling or other things that can help you. Talk with your doctor about using more than one strategy at the same  time, such as taking medicines while you are also going to in-person counseling. This can help make quitting easier. What things can I do to make it easier to quit? Quitting smoking might feel very hard at first, but there is a lot that you can do to make it easier. Take these steps:  Talk to your family and friends. Ask them to support and encourage you.  Call phone quitlines, reach out to support groups, or work with a Social worker.  Ask people who smoke to not smoke around you.  Avoid places that make you want (trigger) to smoke, such as:  Bars.  Parties.  Smoke-break areas at work.  Spend time with people who do not smoke.  Lower the stress in your life. Stress can make you want to smoke. Try these things to help your stress:  Getting regular exercise.  Deep-breathing exercises.  Yoga.  Meditating.  Doing a body scan. To do this, close your eyes, focus on one area of your body at a time from head to toe, and notice which parts of your body are tense. Try to relax the muscles in those areas.  Download or buy apps on your mobile phone or tablet that can help you stick to your quit plan. There are many free apps, such as QuitGuide from the [employer] Office manager for Disease Control and Prevention). You can find more support from smokefree.gov and other websites. This information is not intended to replace advice given to you by your health care provider. Make sure you discuss any questions you have with your health care provider. Document Released: 07/27/2009 Document Revised:  05/28/2016 Document Reviewed: 02/14/2015 Elsevier Interactive Patient Education  2017 Elsevier Inc.   -  Low-Sodium Eating Plan Sodium raises blood pressure and causes water to be held in the body. Getting less sodium from food will help lower your blood pressure, reduce any swelling, and protect your heart, liver, and kidneys. We get sodium by adding salt (sodium chloride) to food. Most of our sodium comes from canned, boxed, and frozen foods. Restaurant foods, fast foods, and pizza are also very high in sodium. Even if you take medicine to lower your blood pressure or to reduce fluid in your body, getting less sodium from your food is important. What is my plan? Most people should limit their sodium intake to 2,300 mg a day. Your health care provider recommends that you limit your sodium intake to 000mg   a day. What do I need to know about this eating plan? For the low-sodium eating plan, you will follow these general guidelines:  Choose foods with a % Daily Value for sodium of less than 5% (as listed on the food label).  Use salt-free seasonings or herbs instead of table salt or sea salt.  Check with your health care provider or pharmacist before using salt substitutes.  Eat fresh foods.  Eat more vegetables and fruits.  Limit canned vegetables. If you do use them, rinse them well to decrease the sodium.  Limit cheese to 1 oz (28 g) per day.  Eat lower-sodium products, often labeled as "lower sodium" or "no salt added."  Avoid foods that contain monosodium glutamate (MSG). MSG is sometimes added to [place] food and some canned foods.  Check food labels (Nutrition Facts labels) on foods to learn how much sodium is in one serving.  Eat more home-cooked food and less restaurant, buffet, and fast food.  When eating at a restaurant, ask that your food be prepared with less  salt, or no salt if possible. How do I read food labels for sodium information? The Nutrition Facts label  lists the amount of sodium in one serving of the food. If you eat more than one serving, you must multiply the listed amount of sodium by the number of servings. Food labels may also identify foods as:  Sodium free-Less than 5 mg in a serving.  Very low sodium-35 mg or less in a serving.  Low sodium-140 mg or less in a serving.  Light in sodium-50% less sodium in a serving. For example, if a food that usually has 300 mg of sodium is changed to become light in sodium, it will have 150 mg of sodium.  Reduced sodium-25% less sodium in a serving. For example, if a food that usually has 400 mg of sodium is changed to reduced sodium, it will have 300 mg of sodium. What foods can I eat? Grains  Low-sodium cereals, including oats, puffed wheat and rice, and shredded wheat cereals. Low-sodium crackers. Unsalted rice and pasta. Lower-sodium bread. Vegetables  Frozen or fresh vegetables. Low-sodium or reduced-sodium canned vegetables. Low-sodium or reduced-sodium tomato sauce and paste. Low-sodium or reduced-sodium tomato and vegetable juices. Fruits  Fresh, frozen, and canned fruit. Fruit juice. Meat and Other Protein Products  Low-sodium canned tuna and salmon. Fresh or frozen meat, poultry, seafood, and fish. Lamb. Unsalted nuts. Dried beans, peas, and lentils without added salt. Unsalted canned beans. Homemade soups without salt. Eggs. Dairy  Milk. Soy milk. Ricotta cheese. Low-sodium or reduced-sodium cheeses. Yogurt. Condiments  Fresh and dried herbs and spices. Salt-free seasonings. Onion and garlic powders. Low-sodium varieties of mustard and ketchup. Fresh or refrigerated horseradish. Lemon juice. Fats and Oils  Reduced-sodium salad dressings. Unsalted butter. Other  Unsalted popcorn and pretzels. The items listed above may not be a complete list of recommended foods or beverages. Contact your dietitian for more options.  What foods are not recommended? Grains  Instant hot cereals.  Bread stuffing, pancake, and biscuit mixes. Croutons. Seasoned rice or pasta mixes. Noodle soup cups. Boxed or frozen macaroni and cheese. Self-rising flour. Regular salted crackers. Vegetables  Regular canned vegetables. Regular canned tomato sauce and paste. Regular tomato and vegetable juices. Frozen vegetables in sauces. Salted [place] fries. Olives. Cody Harper. Relishes. Sauerkraut. Salsa. Meat and Other Protein Products  Salted, canned, smoked, spiced, or pickled meats, seafood, or fish. Bacon, ham, sausage, hot dogs, corned beef, chipped beef, and packaged luncheon meats. Salt pork. Jerky. Pickled herring. Anchovies, regular canned tuna, and sardines. Salted nuts. Dairy  Processed cheese and cheese spreads. Cheese curds. Blue cheese and cottage cheese. Buttermilk. Condiments  Onion and garlic salt, seasoned salt, table salt, and sea salt. Canned and packaged gravies. Worcestershire sauce. Tartar sauce. Barbecue sauce. Teriyaki sauce. Soy sauce, including reduced sodium. Steak sauce. Fish sauce. Oyster sauce. Cocktail sauce. Horseradish that you find on the shelf. Regular ketchup and mustard. Meat flavorings and tenderizers. Bouillon cubes. Hot sauce. Tabasco sauce. Marinades. Taco seasonings. Relishes. Fats and Oils  Regular salad dressings. Salted butter. Margarine. Ghee. Bacon fat. Other  Potato and tortilla chips. Corn chips and puffs. Salted popcorn and pretzels. Canned or dried soups. Pizza. Frozen entrees and pot pies. The items listed above may not be a complete list of foods and beverages to avoid. Contact your dietitian for more information.  This information is not intended to replace advice given to you by your health care provider. Make sure you discuss any questions you have with your health care  provider. Document Released: 03/22/2002 Document Revised: 03/07/2016 Document Reviewed: 08/04/2013 Elsevier Interactive Patient Education  2017 Elsevier  Inc.   -  Hypertension Hypertension is another name for high blood pressure. High blood pressure forces your heart to work harder to pump blood. A blood pressure reading has two numbers, which includes a higher number over a lower number (example: 110/72). Follow these instructions at home:  Have your blood pressure rechecked by your doctor.  Only take medicine as told by your doctor. Follow the directions carefully. The medicine does not work as well if you skip doses. Skipping doses also puts you at risk for problems.  Do not smoke.  Monitor your blood pressure at home as told by your doctor. Contact a doctor if:  You think you are having a reaction to the medicine you are taking.  You have repeat headaches or feel dizzy.  You have puffiness (swelling) in your ankles.  You have trouble with your vision. Get help right away if:  You get a very bad headache and are confused.  You feel weak, numb, or faint.  You get chest or belly (abdominal) pain.  You throw up (vomit).  You cannot breathe very well. This information is not intended to replace advice given to you by your health care provider. Make sure you discuss any questions you have with your health care provider. Document Released: 03/18/2008 Document Revised: 03/07/2016 Document Reviewed: 07/23/2013 Elsevier Interactive Patient Education  2017 [place] (Tetanus and Diphtheria): What You Need to Know 1. Why get vaccinated? Tetanus  and diphtheria are very serious diseases. They are rare in the [place] today, but people who do become infected often have severe complications. Td vaccine is used to protect adolescents and adults from both of these diseases. Both tetanus and diphtheria are infections caused by bacteria. Diphtheria spreads from person to person through coughing or sneezing. Tetanus-causing bacteria enter the body through cuts, scratches, or wounds. TETANUS (lockjaw) causes painful  muscle tightening and stiffness, usually all over the body.  It can lead to tightening of muscles in the head and neck so you can't open your mouth, swallow, or sometimes even breathe. Tetanus kills about 1 out of every 10 people who are infected even after receiving the best medical care. DIPHTHERIA can cause a thick coating to form in the back of the throat.  It can lead to breathing problems, paralysis, heart failure, and death. Before vaccines, as many as 200,000 cases of diphtheria and hundreds of cases of tetanus were reported in the [place] each year. Since vaccination began, reports of cases for both diseases have dropped by about 99%. 2. Td vaccine Td vaccine can protect adolescents and adults from tetanus and diphtheria. Td is usually given as a booster dose every 10 years but it can also be given earlier after a severe and dirty wound or burn. Another vaccine, called Tdap, which protects against pertussis in addition to tetanus and diphtheria, is sometimes recommended instead of Td vaccine. Your doctor or the person giving you the vaccine can give you more information. Td may safely be given at the same time as other vaccines. 3. Some people should not get this vaccine  A person who has ever had a life-threatening allergic reaction after a previous dose of any tetanus or diphtheria containing vaccine, OR has a severe allergy to any part of this vaccine, should not get Td vaccine. Tell the person giving the vaccine about any severe allergies.  Talk to your doctor if you:  had severe pain or swelling after any vaccine containing diphtheria or tetanus,  ever had a condition called Guillain Barre Syndrome (GBS),  aren't feeling well on the day the shot is scheduled. 4. What are the risks from Td vaccine? With any medicine, including vaccines, there is a chance of side effects. These are usually mild and go away on their own. Serious reactions are also possible but are rare. Most  people who get Td vaccine do not have any problems with it. Mild problems following Td vaccine: (Did not interfere with activities)  Pain where the shot was given (about 8 people in 10)  Redness or swelling where the shot was given (about 1 person in 4)  Mild fever (rare)  Headache (about 1 person in 4)  Tiredness (about 1 person in 4) Moderate problems following Td vaccine: (Interfered with activities, but did not require medical attention)  Fever over 102F (rare) Severe problems following Td vaccine: (Unable to perform usual activities; required medical attention)  Swelling, severe pain, bleeding and/or redness in the arm where the shot was given (rare). Problems that could happen after any vaccine:  People sometimes faint after a medical procedure, including vaccination. Sitting or lying down for about 15 minutes can help prevent fainting, and injuries caused by a fall. Tell your doctor if you feel dizzy, or have vision changes or ringing in the ears.  Some people get severe pain in the shoulder and have difficulty moving the arm where a shot was given. This happens very rarely.  Any medication can cause a severe allergic reaction. Such reactions from a vaccine are very rare, estimated at fewer than 1 in a million doses, and would happen within a few minutes to a few hours after the vaccination. As with any medicine, there is a very remote chance of a vaccine causing a serious injury or death. The safety of vaccines is always being monitored. For more information, visit: http://www.aguilar.org/ 5. What if there is a serious reaction? What should I look for? Look for anything that concerns you, such as signs of a severe allergic reaction, very high fever, or unusual behavior. Signs of a severe allergic reaction can include hives, swelling of the face and throat, difficulty breathing, a fast heartbeat, dizziness, and weakness. These would usually start a few minutes to a few  hours after the vaccination. What should I do?  If you think it is a severe allergic reaction or other emergency that can't wait, call 9-1-1 or get the person to the nearest hospital. Otherwise, call your doctor.  Afterward, the reaction should be reported to the Vaccine Adverse Event Reporting System (VAERS). Your doctor might file this report, or you can do it yourself through the VAERS web site at www.vaers.SamedayNews.es, or by calling (409) 849-4574.  VAERS does not give medical advice. 6. The National Vaccine Injury Compensation Program The [employer] Vaccine Injury Compensation Program (VICP) is a federal program that was created to compensate people who may have been injured by certain vaccines. Persons who believe they may have been injured by a vaccine can learn about the program and about filing a claim by calling (313)700-0110 or visiting the [place] website at GoldCloset.com.ee. There is a time limit to file a claim for compensation. 7. How can I learn more?  Ask your doctor. He or she can give you the vaccine package insert or suggest other sources of information.  Call your local or state health department.  Contact the Centers for Disease Control and Prevention (CDC):  Call (828) 179-8082 (1-800-CDC-INFO)  Visit CDC's website at http://hunter.com/ CDC Td Vaccine VIS (01/23/16) This information is not intended to replace advice given to you by your health care provider. Make sure you discuss any questions you have with your health care provider. Document Released: 07/28/2006 Document Revised: 06/20/2016 Document Reviewed: 06/20/2016 Elsevier Interactive Patient Education  2017 [employer].

## 2016-11-04 NOTE — Progress Notes (Signed)
Cody Harper, is a 51 y.o. male  [id number]  [id number]  DOB - 16-Feb-1966  CC:  Chief Complaint  Patient presents with  . Establish Care  . Diabetes       HPI: Cody Harper is a 51 y.o. male here today to establish medical care.  Recently seen by PA Tiffany, was set up w/ Vascular surg for eval of pvd, appt not til March per pt. +dm 2, uncontrolled, and tob abuse, now smoking 1/2 -1ppd, use to smoke 1-2 ppd.  Denies etoh.  Has not been on metformin since recent hospitalization, since he thought they told him to stop it. Only on nph 70/30 32 units bid at this time. Denies hypglycemia, his am cbg typically are running >250s.  Eats a lot of rice.  Patient has No headache, No chest pain, No abdominal pain - No Nausea, No new weakness tingling or numbness, No Cough - SOB.    Review of Systems: Per hpi, o/w all systems reviewed and negative.    No Known Allergies Past Medical History:  Diagnosis Date  . Arthritis    "fingers" (10/11/2016)  . Cellulitis of right leg 11/12/2014; 10/11/2016  . Diabetes type 2, uncontrolled ([place]) 03/20/2011  . Diabetic foot ulcer ([place]) 03/20/2011  . Elevated TSH 11/13/2014  . Obesity, unspecified 05/31/2014  . Other and unspecified hyperlipidemia 05/31/2014  . Pneumonia 2016  . Superficial vein thrombosis 06/01/2014  . Syncope 05/30/2014  . Tendinitis of elbow or forearm 11/13/2014  . Tobacco use disorder 05/31/2014  . 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 Vein, varicose 03/20/2011   Current Outpatient Prescriptions on File Prior to Visit  Medication Sig Dispense Refill  . HYDROcodone-acetaminophen (NORCO/VICODIN) 5-325 MG tablet Take 1 tablet by mouth every 6 (six) hours as needed for severe pain. 20 tablet 0   No current facility-administered medications on file prior to visit.    Family History  Problem Relation Age of Onset  . Cancer Mother     breast cancer   . Hypertension Mother   . Hypertension Father     Social History   Social History  . Marital status: Married    Spouse name: N/A  . Number of children: N/A  . Years of education: N/A   Occupational History  . Not on file.   Social History Main Topics  . Smoking status: Current Every Day Smoker    Packs/day: 1.00    Years: 40.00    Types: Cigarettes  . Smokeless tobacco: Former Systems developer    Types: Chew     Comment: "chewed for ~ 1 month in my teens"  . Alcohol use Yes     Comment: 10/11/2016 "I quit ~ 08/2015"  . Drug use: No  . 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 Not on file   Social History Narrative  . No narrative on file    Objective:   Vitals:   11/04/16 0927  BP: (!) 154/96  Pulse: 72  Resp: 16  Temp: 98.1 F (36.7 C)    Filed Weights   11/04/16 0927  Weight: 293 lb 6.4 oz (133.1 kg)    BP Readings from Last 3 Encounters:  11/04/16 (!) 154/96  10/23/16 138/86  10/14/16 123/74    Physical Exam: Constitutional: Patient appears well-developed and well-nourished. No distress. AAOx3, elderly appearing. Pleasant. HENT: Normocephalic, atraumatic, External right and left  ear normal. Oropharynx is clear and moist.  bilat TMs clear. Eyes: Conjunctivae and EOM are normal. PERRL, no scleral icterus. Neck: Normal ROM. Neck supple. No JVD.  CVS: RRR, S1/S2 +, no murmurs, no gallops, no carotid bruit.  Pulmonary: Effort and breath sounds normal, no stridor, rhonchi, wheezes, rales.  Abdominal: Soft. BS +, obese, no distension, tenderness, rebound or guarding.  Musculoskeletal: Normal range of motion. No edema and no tenderness.  Foot exam: bilateral peripheral pulses 1+ (dorsalis pedis and post tibialis pulses), no ulcers noted/no ecchymosis, warm to touch, monofilament testing 1/3 bilat. Sensation intact.  No c/c, 1+ bilat le edema.  Chronic venous stasis changes bilat, w/ poorly healing wound on the lower right medial aspect of ankle.  Long yellow  tinged toe nails, bilat, mycotic appearing. Neuro: Alert.  muscle tone coordination wnl. No cranial nerve deficit grossly. Skin: Skin is warm and dry. No rash noted. Not diaphoretic. No erythema. No pallor. Psychiatric: Normal mood and affect. Behavior, judgment, thought content normal.  Lab Results  Component Value Date   WBC 8.3 10/13/2016   HGB 12.7 (L) 10/13/2016   HCT 37.5 (L) 10/13/2016   MCV 86.8 10/13/2016   PLT 236 10/13/2016   Lab Results  Component Value Date   CREATININE 0.46 (L) 10/14/2016   BUN 8 10/14/2016   NA 135 10/14/2016   K 3.6 10/14/2016   CL 98 (L) 10/14/2016   CO2 28 10/14/2016    Lab Results  Component Value Date   HGBA1C 11.4 (H) 10/10/2016   Lipid Panel     Component Value Date/Time   CHOL 232 (H) 05/30/2014 0019   TRIG 336 (H) 05/30/2014 0019   HDL 40 05/30/2014 0019   CHOLHDL 5.8 05/30/2014 0019   VLDL 67 (H) 05/30/2014 0019   LDLCALC 125 (H) 05/30/2014 0019        Depression screen PHQ 2/9 11/04/2016 10/23/2016 12/05/2014 03/20/2011  Decreased Interest 0 1 0 0  Down, Depressed, Hopeless 1 1 0 0  PHQ - 2 Score 1 2 0 0  Altered sleeping 1 2 - -  Tired, decreased energy (No Data) 2 - -  Change in appetite 0 0 - -  Feeling bad or failure about yourself  1 1 - -  Trouble concentrating 0 0 - -  Moving slowly or fidgety/restless 0 0 - -  Suicidal thoughts 0 0 - -  PHQ-9 Score 3 7 - -    Assessment and plan:   1.  Uncontrolled type 2 diabetes mellitus with diabetic polyneuropathy, with long-term current use of insulin (HCC) Uncontrolled, diab diet discussed at length, recd increase exercise, Renewed metformin 1000bid, instructed to resume it. Increase nph 70/30 to 38/units bid - fu [facility] - dm chk 2 wks - Microalbumin/Creatinine Ratio, Urine - Ambulatory referral to Podiatry - Ambulatory referral to Ophthalmology - Lipid Panel - POCT glucose (manual entry)  2. htn - low salt diet recd, increase exercise - start prinzide  10-12.5 qd - Rn fu w/ [place] 2 wks, if sbp >130, increase prinzide to 20-25 qd  3. Other diabetic neurological complication associated with type 2 diabetes mellitus (HCC) - gabapentin (NEURONTIN) 300 MG capsule; Take 1 capsule (300 mg total) by mouth 3 (three) times daily.  Dispense: 90 capsule; Refill: 3  4. Tobacco use disorder 1/2-1ppd, recd total cessation given concern for pvd. Has vass surg eval in March,   5. Hyperlipidemia, unspecified hyperlipidemia type - pravastatin (PRAVACHOL) 20 MG tablet; Take 1 tablet (  20 mg total) by mouth daily.  Dispense: 30 tablet; Refill: 3 - continue ASA 81 qd - ASCVD risk score >20% based on lipids 2015.  6. Prostate cancer screening - PSA  7. Colon cancer screening - Ambulatory referral to Gastroenterology   Return in about 3 months (around 02/02/2017), or if symptoms worsen or fail to improve.  The patient was given clear instructions to go to ER or return to medical center if symptoms don't improve, worsen or new problems develop. The patient verbalized understanding. The patient was told to call to get lab results if they haven't heard anything in the next week.    This note has been created with Surveyor, quantity. Any transcriptional errors are unintentional.   Maren Reamer, MD, [facility], [place]   11/04/2016, 11:36 AM

## 2016-11-05 ENCOUNTER — Other Ambulatory Visit: Payer: Self-pay | Admitting: Internal Medicine

## 2016-11-05 DIAGNOSIS — E785 Hyperlipidemia, unspecified: Secondary | ICD-10-CM

## 2016-11-05 LAB — MICROALBUMIN / CREATININE URINE RATIO
Creatinine, Urine: 128 mg/dL (ref 20–370)
MICROALB UR: 4.2 mg/dL
Microalb Creat Ratio: 33 mcg/mg creat — ABNORMAL HIGH (ref <30)

## 2016-11-05 MED ORDER — PRAVASTATIN SODIUM 40 MG PO TABS: 40.0000 mg | ORAL_TABLET | Freq: Every day | ORAL | 3 refills | Status: AC

## 2016-11-06 ENCOUNTER — Telehealth: Payer: Self-pay

## 2016-11-06 NOTE — Telephone Encounter (Signed)
Contacted pt to go over lab results pt is aware of results and doesn't have any questions or concerns 

## 2016-11-21 ENCOUNTER — Ambulatory Visit (INDEPENDENT_AMBULATORY_CARE_PROVIDER_SITE_OTHER): Payer: Managed Care, Other (non HMO) | Admitting: Podiatry

## 2016-11-21 ENCOUNTER — Ambulatory Visit (INDEPENDENT_AMBULATORY_CARE_PROVIDER_SITE_OTHER): Payer: Managed Care, Other (non HMO)

## 2016-11-21 ENCOUNTER — Encounter: Payer: Self-pay | Admitting: Podiatry

## 2016-11-21 VITALS — BP 154/95 | HR 91 | Resp 16 | Ht 75.0 in | Wt 293.0 lb

## 2016-11-21 DIAGNOSIS — B351 Tinea unguium: Secondary | ICD-10-CM

## 2016-11-21 DIAGNOSIS — M779 Enthesopathy, unspecified: Secondary | ICD-10-CM

## 2016-11-21 DIAGNOSIS — M79674 Pain in right toe(s): Secondary | ICD-10-CM | POA: Diagnosis not present

## 2016-11-21 DIAGNOSIS — E1149 Type 2 diabetes mellitus with other diabetic neurological complication: Secondary | ICD-10-CM | POA: Diagnosis not present

## 2016-11-21 DIAGNOSIS — M205X9 Other deformities of toe(s) (acquired), unspecified foot: Secondary | ICD-10-CM | POA: Diagnosis not present

## 2016-11-21 DIAGNOSIS — M79675 Pain in left toe(s): Secondary | ICD-10-CM | POA: Diagnosis not present

## 2016-11-21 MED ORDER — NONFORMULARY OR COMPOUNDED ITEM: 2 refills | Status: DC

## 2016-11-21 NOTE — Patient Instructions (Signed)

## 2016-11-21 NOTE — Progress Notes (Signed)
   Subjective:    Patient ID: Cody Harper, male    DOB: Jul 01, 1966, 51 y.o.   MRN: [id number]  HPI 52 year old male presents the office today for concerns of numbness to both of his feet as well as some sharp pains at times. He states his blood sugar this morning was 318 his last A1c was 11.4. He is also requesting diabetic shoes. He states his nails are thick, painful and he cannot trim them himself. He denies any open sores. Denies any increase in swelling or redness to his feet. He has no other complaints today.   Review of Systems  HENT: Positive for sneezing.   Respiratory: Positive for cough and wheezing.   Cardiovascular: Positive for leg swelling.  Endocrine: Positive for polyuria.  Genitourinary: Positive for frequency.  Musculoskeletal: Positive for arthralgias, gait problem and myalgias.  Neurological: Positive for weakness and numbness.  Hematological: Bruises/bleeds easily.  All other systems reviewed and are negative.      Objective:   Physical Exam General: AAO x3, NAD  Dermatological: Nails are hypertrophic, dystrophic, brittle, discolored, elongated 10. No surrounding redness or drainage. Tenderness nails 1-5 bilaterally. No open lesions or pre-ulcerative lesions are identified today.  Vascular: Dorsalis Pedis artery and Posterior Tibial artery pedal pulses are 2/4 bilateral with immedate capillary fill time.There is no pain with calf compression, swelling, warmth, erythema.   Neruologic: Sensation decreased with Derrel Nip monofilament.  Musculoskeletal: Decreased range of motion of the left first MTPJ. There is no other areas of tenderness identified bilaterally. Muscular strength 5/5 in all groups tested bilateral.  Gait: Unassisted, Nonantalgic.      Assessment & Plan:  51 year old male with symptomatic onychomycosis, neuropathy -Treatment options discussed including all alternatives, risks, and complications -X-rays were obtained and reviewed  with the patient. No evidence of acute fracture identified. Arthritic changes of the first MPJ. No evidence of acute fracture identified bilaterally. -Etiology of symptoms were discussed -Nails debrided 10 without complications or bleeding. -Daily foot inspection -Discussed treatment options for neuropathy. He elected to proceed with a  compound cream is ordered today through [employer].  -Paperwork was completed today for diabetic shoe precertification. -Follow-up in 3 months or sooner if any problems arise. In the meantime, encouraged to call the office with any questions, concerns, change in symptoms.   Celesta Gentile, DPM

## 2016-12-05 ENCOUNTER — Other Ambulatory Visit: Payer: Self-pay | Admitting: *Deleted

## 2016-12-05 DIAGNOSIS — L03115 Cellulitis of right lower limb: Secondary | ICD-10-CM

## 2016-12-06 ENCOUNTER — Encounter: Payer: Self-pay | Admitting: Vascular Surgery

## 2016-12-12 ENCOUNTER — Ambulatory Visit: Payer: MEDICAID | Admitting: Podiatry

## 2016-12-12 ENCOUNTER — Ambulatory Visit (HOSPITAL_COMMUNITY)
Admission: RE | Admit: 2016-12-12 | Discharge: 2016-12-12 | Disposition: A | Discharge status: Home / Self Care | Payer: 59 | Source: Ambulatory Visit | Attending: Vascular Surgery | Admitting: Vascular Surgery

## 2016-12-12 ENCOUNTER — Encounter: Payer: Self-pay | Admitting: Vascular Surgery

## 2016-12-12 ENCOUNTER — Ambulatory Visit (INDEPENDENT_AMBULATORY_CARE_PROVIDER_SITE_OTHER): Payer: 59 | Admitting: Vascular Surgery

## 2016-12-12 VITALS — BP 133/80 | HR 87 | Temp 99.8°F | Resp 20 | Ht 75.0 in | Wt 287.0 lb

## 2016-12-12 DIAGNOSIS — I83811 Varicose veins of right lower extremities with pain: Secondary | ICD-10-CM | POA: Diagnosis not present

## 2016-12-12 DIAGNOSIS — I8391 Asymptomatic varicose veins of right lower extremity: Secondary | ICD-10-CM | POA: Insufficient documentation

## 2016-12-12 DIAGNOSIS — L03115 Cellulitis of right lower limb: Secondary | ICD-10-CM

## 2016-12-12 NOTE — Progress Notes (Signed)
Referring Physician: Zettie Pho  Patient name: Cody Harper MRN: [id number] DOB: 1966/06/25 Sex: male  REASON FOR CONSULT: Varicose veins with ulceration and pain  HPI: Cody Harper is a 51 y.o. male [place] with several year history of right leg swelling. He denies any prior history of DVT. He has had several episodes of superficial thrombophlebitis in the past. He has also had 6 episodes of cellulitis in the right leg. He currently has tenderness and itching in the right leg. He has worn knee-high compression stockings in the past with minimal relief. Other medical problems include diabetes and hypertension both of which are stable. He is currently on aspirin and a statin. He does have some numbness and tingling in the toes bilaterally consistent with neuropathy. He currently does smoking greater than 3 minutes spent today regarding tobacco cessation counseling  Past Medical History:  Diagnosis Date  . Arthritis    "fingers" (10/11/2016)  . Cellulitis of right leg 11/12/2014; 10/11/2016  . Diabetes type 2, uncontrolled ([place]) 03/20/2011  . Diabetic foot ulcer ([place]) 03/20/2011  . Elevated TSH 11/13/2014  . Obesity, unspecified 05/31/2014  . Other and unspecified hyperlipidemia 05/31/2014  . Pneumonia 2016  . Superficial vein thrombosis 06/01/2014  . Syncope 05/30/2014  . Tendinitis of elbow or forearm 11/13/2014  . Tobacco use disorder 05/31/2014  . 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 Vein, varicose 03/20/2011   Past Surgical History:  Procedure Laterality Date  . APPENDECTOMY      Family History  Problem Relation Age of Onset  . Cancer Mother     breast cancer   . Hypertension Mother   . Hypertension Father     SOCIAL HISTORY: Social History   Social History  . Marital status: Married    Spouse name: N/A  . Number of children: N/A  . Years of education: N/A   Occupational History  . Not on file.   Social History Main Topics  . Smoking  status: Current Every Day Smoker    Packs/day: 1.00    Years: 40.00    Types: Cigarettes  . Smokeless tobacco: Former Systems developer    Types: Chew     Comment: "chewed for ~ 1 month in my teens"  . Alcohol use Yes     Comment: 10/11/2016 "I quit ~ 08/2015"  . Drug use: No  . 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 Not on file   Social History Narrative  . No narrative on file    No Known Allergies  Current Outpatient Prescriptions  Medication Sig Dispense Refill  . aspirin EC 81 MG tablet Take 1 tablet (81 mg total) by mouth daily.    Marland Kitchen gabapentin (NEURONTIN) 300 MG capsule Take 1 capsule (300 mg total) by mouth 3 (three) times daily. 90 capsule 3  . insulin aspart protamine- aspart (NOVOLOG MIX 70/30) (70-30) 100 UNIT/ML injection Inject 0.38 mLs (38 Units total) into the skin 2 (two) times daily with a meal. 20 mL 3  . lisinopril-hydrochlorothiazide (PRINZIDE,ZESTORETIC) 10-12.5 MG tablet Take 1 tablet by mouth daily. 90 tablet 3  . metFORMIN (GLUCOPHAGE) 1000 MG tablet Take 1 tablet (1,000 mg total) by mouth 2 (two) times daily with a meal. 60 tablet 6  . NONFORMULARY OR COMPOUNDED ITEM [facility]: Peripheral Neuropathy Cream - Bupivacaine 1%, Doxepin 3%, Gabapentin 6%, Pentoxifylline 3%, Topiramate 1%, apply  1-2 grams to affected area 3-4 times daily. 120 each 2  . pravastatin (PRAVACHOL) 40 MG tablet Take 1 tablet (40 mg total) by mouth daily. 90 tablet 3   No current facility-administered medications for this visit.     ROS:   General:  No weight loss, Fever, chills  HEENT: No recent headaches, no nasal bleeding, no visual changes, no sore throat  Neurologic: No dizziness, blackouts, seizures. No recent symptoms of stroke or mini- stroke. No recent episodes of slurred speech, or temporary blindness.  Cardiac: No recent episodes of chest pain/pressure, no shortness of breath at rest.  No shortness of breath with  exertion.  Denies history of atrial fibrillation or irregular heartbeat  Vascular: No history of rest pain in feet.  No history of claudication.  + history of non-healing ulcer, No history of DVT   Pulmonary: No home oxygen, no productive cough, no hemoptysis,  No asthma or wheezing  Musculoskeletal:  [ ]  Arthritis, [ ]  Low back pain,  [ ]  Joint pain  Hematologic:No history of hypercoagulable state.  No history of easy bleeding.  No history of anemia  Gastrointestinal: No hematochezia or melena,  No gastroesophageal reflux, no trouble swallowing  Urinary: [ ]  chronic Kidney disease, [ ]  on HD - [ ]  MWF or [ ]  TTHS, [ ]  Burning with urination, [ ]  Frequent urination, [ ]  Difficulty urinating;   Skin: No rashes  Psychological: No history of anxiety,  No history of depression   Physical Examination  Vitals:   12/12/16 1516  BP: 133/80  Pulse: 87  Resp: 20  Temp: 99.8 F (37.7 C)  TempSrc: Oral  SpO2: 98%  Weight: 287 lb (130.2 kg)  Height: 6\' 3"  (1.905 m)    Body mass index is 35.87 kg/m.  General:  Alert and oriented, no acute distress HEENT: Normal Neck: No bruit or JVD Pulmonary: Clear to auscultation bilaterally Cardiac: Regular Rate and Rhythm without murmur Abdomen: Soft, non-tender, non-distended, no mass, no scars Skin: Superficial abrasions and excoriations over entire anterior compartment of right leg with some surrounding erythema multiple thrombosed superficial varicosities tender to palpation, 2 x 3 cm ulceration right medial malleolus with thickened eschar, greater saphenous vein tortuous and palpable throughout the entire thigh and upper calf. This is also tender to palpation. Extremity Pulses:  2+ radial, brachial, femoral, dorsalis pedis bilaterally, absent right posterior tibial pulse 2+ left posterior tibial pulse Musculoskeletal: No deformity trace right leg edema  Neurologic: Upper and lower extremity motor 5/5 and symmetric  DATA:  Patient a right  lower extremity venous duplex today which showed reflux in the common femoral popliteal vein as well as in the greater and lesser saphenous veins. Greater and lesser saphenous vein diameters were 10 mm and 5 mm respectively.  ASSESSMENT:  Right lower extremity superficial and deep venous reflux with multiple episodes of cellulitis superficial thrombophlebitis and ulceration.  CEAP class 6.  Tobacco abuse patient was counseled for greater than 3 minutes today regarding smoking cessation   PLAN:  #1 We will place the patient in an Unna boot today and repeat this weekly for the next 4 weeks to heal his ulcer.  2.  He will have follow-up for consideration of laser ablation with either Dr. Donnetta Hutching and Dr. Kellie Simmering in 3 months.  3.Patient was given a work note today to stay out of work and elevate his right leg until Monday March 5   Ruta Hinds, MD [facility] Office:  [phone] Pager: [phone]

## 2016-12-13 ENCOUNTER — Encounter: Payer: Self-pay | Admitting: Family

## 2016-12-19 ENCOUNTER — Encounter: Payer: 59 | Admitting: Family

## 2016-12-20 ENCOUNTER — Encounter: Payer: Self-pay | Admitting: Vascular Surgery

## 2016-12-25 ENCOUNTER — Ambulatory Visit: Payer: Managed Care, Other (non HMO) | Admitting: Podiatry

## 2017-01-02 ENCOUNTER — Encounter: Payer: 59 | Admitting: Family

## 2017-01-02 ENCOUNTER — Ambulatory Visit (INDEPENDENT_AMBULATORY_CARE_PROVIDER_SITE_OTHER): Payer: 59 | Admitting: Family

## 2017-01-02 DIAGNOSIS — I83811 Varicose veins of right lower extremities with pain: Secondary | ICD-10-CM | POA: Diagnosis not present

## 2017-01-02 NOTE — Progress Notes (Signed)
Cody Harper was seen today for an unna boot reapplication to his R LE. He had removed the previous unna boot prior and cleansed his leg with soap and water. A new unna boot was applied. Pt states his leg is looking and feeling a lot better. He will return next week for reapplication.

## 2017-01-03 ENCOUNTER — Encounter: Payer: Self-pay | Admitting: Family

## 2017-01-03 ENCOUNTER — Other Ambulatory Visit: Payer: Self-pay | Admitting: Gastroenterology

## 2017-01-07 ENCOUNTER — Encounter (HOSPITAL_COMMUNITY): Payer: Self-pay | Admitting: *Deleted

## 2017-01-08 ENCOUNTER — Encounter (HOSPITAL_COMMUNITY): Payer: Self-pay | Admitting: *Deleted

## 2017-01-08 ENCOUNTER — Encounter (HOSPITAL_COMMUNITY)
Admission: RE | Disposition: A | Discharge status: Home / Self Care | Payer: Self-pay | Source: Ambulatory Visit | Attending: Gastroenterology

## 2017-01-08 ENCOUNTER — Ambulatory Visit (HOSPITAL_COMMUNITY): Payer: 59 | Admitting: Certified Registered Nurse Anesthetist

## 2017-01-08 ENCOUNTER — Ambulatory Visit (HOSPITAL_COMMUNITY)
Admission: RE | Admit: 2017-01-08 | Discharge: 2017-01-08 | Disposition: A | Discharge status: Home / Self Care | Payer: 59 | Source: Ambulatory Visit | Attending: Gastroenterology | Admitting: Gastroenterology

## 2017-01-08 DIAGNOSIS — E785 Hyperlipidemia, unspecified: Secondary | ICD-10-CM | POA: Insufficient documentation

## 2017-01-08 DIAGNOSIS — Z79899 Other long term (current) drug therapy: Secondary | ICD-10-CM | POA: Diagnosis not present

## 2017-01-08 DIAGNOSIS — Z1211 Encounter for screening for malignant neoplasm of colon: Secondary | ICD-10-CM | POA: Diagnosis not present

## 2017-01-08 DIAGNOSIS — D125 Benign neoplasm of sigmoid colon: Secondary | ICD-10-CM | POA: Insufficient documentation

## 2017-01-08 DIAGNOSIS — K640 First degree hemorrhoids: Secondary | ICD-10-CM | POA: Insufficient documentation

## 2017-01-08 DIAGNOSIS — F17210 Nicotine dependence, cigarettes, uncomplicated: Secondary | ICD-10-CM | POA: Insufficient documentation

## 2017-01-08 DIAGNOSIS — Z86718 Personal history of other venous thrombosis and embolism: Secondary | ICD-10-CM | POA: Diagnosis not present

## 2017-01-08 DIAGNOSIS — I10 Essential (primary) hypertension: Secondary | ICD-10-CM | POA: Diagnosis not present

## 2017-01-08 DIAGNOSIS — E1140 Type 2 diabetes mellitus with diabetic neuropathy, unspecified: Secondary | ICD-10-CM | POA: Diagnosis not present

## 2017-01-08 DIAGNOSIS — Z794 Long term (current) use of insulin: Secondary | ICD-10-CM | POA: Insufficient documentation

## 2017-01-08 DIAGNOSIS — Z7982 Long term (current) use of aspirin: Secondary | ICD-10-CM | POA: Diagnosis not present

## 2017-01-08 HISTORY — DX: Essential (primary) hypertension: I10

## 2017-01-08 HISTORY — PX: COLONOSCOPY WITH PROPOFOL: SHX5780

## 2017-01-08 LAB — GLUCOSE, CAPILLARY: Glucose-Capillary: 193 mg/dL — ABNORMAL HIGH (ref 65–99)

## 2017-01-08 SURGERY — COLONOSCOPY WITH PROPOFOL
Anesthesia: Monitor Anesthesia Care

## 2017-01-08 MED ORDER — PROPOFOL 500 MG/50ML IV EMUL
INTRAVENOUS | Status: DC | PRN
  Administered 2017-01-08: 150 ug/kg/min via INTRAVENOUS

## 2017-01-08 MED ORDER — PROPOFOL 10 MG/ML IV BOLUS
INTRAVENOUS | Status: AC
  Filled 2017-01-08: qty 20

## 2017-01-08 MED ORDER — PROPOFOL 10 MG/ML IV BOLUS
INTRAVENOUS | Status: AC
  Filled 2017-01-08: qty 60

## 2017-01-08 MED ORDER — PROPOFOL 10 MG/ML IV BOLUS
INTRAVENOUS | Status: DC | PRN
  Administered 2017-01-08 (×2): 20 mg via INTRAVENOUS
  Administered 2017-01-08: 30 mg via INTRAVENOUS
  Administered 2017-01-08: 40 mg via INTRAVENOUS

## 2017-01-08 MED ORDER — SODIUM CHLORIDE 0.9 % IV SOLN
INTRAVENOUS | Status: DC
Start: 2017-01-08 — End: 2017-01-08

## 2017-01-08 MED ORDER — PHENYLEPHRINE 40 MCG/ML (10ML) SYRINGE FOR IV PUSH (FOR BLOOD PRESSURE SUPPORT)
PREFILLED_SYRINGE | INTRAVENOUS | Status: AC
  Filled 2017-01-08: qty 10

## 2017-01-08 MED ORDER — LACTATED RINGERS IV SOLN
INTRAVENOUS | Status: DC
  Administered 2017-01-08: 1000 mL via INTRAVENOUS

## 2017-01-08 MED ORDER — PHENYLEPHRINE 40 MCG/ML (10ML) SYRINGE FOR IV PUSH (FOR BLOOD PRESSURE SUPPORT)
PREFILLED_SYRINGE | INTRAVENOUS | Status: DC | PRN
  Administered 2017-01-08: 80 ug via INTRAVENOUS

## 2017-01-08 SURGICAL SUPPLY — 22 items

## 2017-01-08 NOTE — Interval H&P Note (Signed)
History and Physical Interval Note:  01/08/2017 8:24 AM  Cody Harper  has presented today for surgery, with the diagnosis of screening  The various methods of treatment have been discussed with the patient and family. After consideration of risks, benefits and other options for treatment, the patient has consented to  Procedure(s): COLONOSCOPY WITH PROPOFOL (N/A) as a surgical intervention .  The patient's history has been reviewed, patient examined, no change in status, stable for surgery.  I have reviewed the patient's chart and labs.  Questions were answered to the patient's satisfaction.     Glenn Dale C.

## 2017-01-08 NOTE — Anesthesia Preprocedure Evaluation (Signed)
Anesthesia Evaluation  Patient identified by MRN, date of birth, ID band Patient awake    Reviewed: Allergy & Precautions, NPO status , Patient's Chart, lab work & pertinent test results  Airway Mallampati: II  TM Distance: >3 FB Neck ROM: Full    Dental no notable dental hx.    Pulmonary Current Smoker,    Pulmonary exam normal breath sounds clear to auscultation       Cardiovascular hypertension, Normal cardiovascular exam Rhythm:Regular Rate:Normal     Neuro/Psych negative neurological ROS  negative psych ROS   GI/Hepatic negative GI ROS, Neg liver ROS,   Endo/Other  diabetes  Renal/GU negative Renal ROS  negative genitourinary   Musculoskeletal negative musculoskeletal ROS (+)   Abdominal   Peds negative pediatric ROS (+)  Hematology negative hematology ROS (+)   Anesthesia Other Findings   Reproductive/Obstetrics negative OB ROS                             Anesthesia Physical Anesthesia Plan  ASA: II  Anesthesia Plan: MAC   Post-op Pain Management:    Induction: Intravenous  Airway Management Planned: Nasal Cannula  Additional Equipment:   Intra-op Plan:   Post-operative Plan:   Informed Consent: I have reviewed the patients History and Physical, chart, labs and discussed the procedure including the risks, benefits and alternatives for the proposed anesthesia with the patient or authorized representative who has indicated his/her understanding and acceptance.   Dental advisory given  Plan Discussed with: CRNA and Surgeon  Anesthesia Plan Comments:         Anesthesia Quick Evaluation

## 2017-01-08 NOTE — H&P (Signed)
Date of Initial H&P: 01/01/17  History reviewed, patient examined, no change in status, stable for surgery.

## 2017-01-08 NOTE — Transfer of Care (Signed)
Immediate Anesthesia Transfer of Care Note  Patient: Cody Harper  Procedure(s) Performed: Procedure(s): COLONOSCOPY WITH PROPOFOL (N/A)  Patient Location: PACU  Anesthesia Type:MAC  Level of Consciousness: Patient easily awoken, sedated, comfortable, cooperative, following commands, responds to stimulation.   Airway & Oxygen Therapy: Patient spontaneously breathing, ventilating well, oxygen via simple oxygen mask.  Post-op Assessment: Report given to PACU RN, vital signs reviewed and stable, moving all extremities.   Post vital signs: Reviewed and stable.  Complications: No apparent anesthesia complications  Last Vitals:  Vitals:   01/08/17 0722  BP: (!) 143/85  Pulse: 68  Resp: 17  Temp: 36.6 C    Last Pain:  Vitals:   01/08/17 0722  TempSrc: Oral         Complications: No apparent anesthesia complications

## 2017-01-08 NOTE — Discharge Instructions (Signed)
Will call you when pathology results on the polyp are complete. No Aspirin for the next 5 days. Avoid Ibuprofen products for the next 2 weeks.

## 2017-01-08 NOTE — Anesthesia Postprocedure Evaluation (Addendum)
Anesthesia Post Note  Patient: Cody Harper  Procedure(s) Performed: Procedure(s) (LRB): COLONOSCOPY WITH PROPOFOL (N/A)  Patient location during evaluation: PACU Anesthesia Type: MAC Level of consciousness: awake and alert Pain management: pain level controlled Vital Signs Assessment: post-procedure vital signs reviewed and stable Respiratory status: spontaneous breathing, nonlabored ventilation, respiratory function stable and patient connected to nasal cannula oxygen Cardiovascular status: stable and blood pressure returned to baseline Anesthetic complications: no       Last Vitals:  Vitals:   01/08/17 0722 01/08/17 0903  BP: (!) 143/85 117/66  Pulse: 68 65  Resp: 17 17  Temp: 36.6 C 36.4 C    Last Pain:  Vitals:   01/08/17 0903  TempSrc: Oral                 [name] S

## 2017-01-08 NOTE — Op Note (Signed)
[facility] Patient Name: Cody Harper Procedure Date: 01/08/2017 MRN: [id number] Attending MD: Lear Ng , MD Date of Birth: 1966/01/08 CSN: [id number] Age: 51 Admit Type: Outpatient Procedure:                Colonoscopy Indications:              Screening for colorectal malignant neoplasm, This                            is the patient's first colonoscopy Providers:                Lear Ng, MD, Zenon Mayo, RN,                            William Dalton, Technician Referring MD:              Medicines:                Propofol per Anesthesia, Monitored Anesthesia Care Complications:            No immediate complications. Estimated Blood Loss:     Estimated blood loss: none. Procedure:                Pre-Anesthesia Assessment:                           - Prior to the procedure, a History and Physical                            was performed, and patient medications and                            allergies were reviewed. The patient's tolerance of                            previous anesthesia was also reviewed. The risks                            and benefits of the procedure and the sedation                            options and risks were discussed with the patient.                            All questions were answered, and informed consent                            was obtained. Prior Anticoagulants: The patient has                            taken no previous anticoagulant or antiplatelet                            agents. ASA Grade Assessment: II - A patient with  mild systemic disease. After reviewing the risks                            and benefits, the patient was deemed in                            satisfactory condition to undergo the procedure.                           After obtaining informed consent, the colonoscope                            was passed under direct vision. Throughout the                             procedure, the patient's blood pressure, pulse, and                            oxygen saturations were monitored continuously. The                            EC-[id number] ([id number]) scope was introduced through                            the anus and advanced to the the cecum, identified                            by appendiceal orifice and ileocecal valve. The                            colonoscopy was performed without difficulty. The                            patient tolerated the procedure well. The quality                            of the bowel preparation was adequate and good. The                            appendiceal orifice and the rectum were                            photographed. Scope In: 8:37:40 AM Scope Out: 8:55:15 AM Scope Withdrawal Time: 0 hours 12 minutes 39 seconds  Total Procedure Duration: 0 hours 17 minutes 35 seconds  Findings:      The perianal and digital rectal examinations were normal.      A 18 mm polyp was found in the sigmoid colon. The polyp was       pedunculated. The polyp was removed with a hot snare. Resection and       retrieval were complete. Estimated blood loss: none.      Internal hemorrhoids were found during retroflexion. The hemorrhoids       were medium-sized and Grade I (internal hemorrhoids that do not  prolapse). Impression:               - One 18 mm polyp in the sigmoid colon, removed                            with a hot snare. Resected and retrieved.                           - Internal hemorrhoids. Moderate Sedation:      N/A- Per Anesthesia Care Recommendation:           - Patient has a contact number available for                            emergencies. The signs and symptoms of potential                            delayed complications were discussed with the                            patient. Return to normal activities tomorrow.                            Written discharge instructions were provided to the                             patient.                           - Resume previous diet.                           - Await pathology results.                           - Repeat colonoscopy for surveillance based on                            pathology results.                           - No aspirin products for 5 days.                           - No ibuprofen, naproxen, or other non-steroidal                            anti-inflammatory drugs for 2 weeks. Procedure Code(s):        --- Professional ---                           [phone], Colonoscopy, flexible; with removal of                            tumor(s), polyp(s), or other lesion(s) by snare  technique Diagnosis Code(s):        --- Professional ---                           Z12.11, Encounter for screening for malignant                            neoplasm of colon                           D12.5, Benign neoplasm of sigmoid colon                           K64.0, First degree hemorrhoids CPT copyright 2016 [employer]. All rights reserved. The codes documented in this report are preliminary and upon coder review may  be revised to meet current compliance requirements. Lear Ng, MD 01/08/2017 9:04:57 AM This report has been signed electronically. Number of Addenda: 0

## 2017-01-08 NOTE — Interval H&P Note (Signed)
History and Physical Interval Note:  01/08/2017 8:24 AM  Cody Harper  has presented today for surgery, with the diagnosis of screening  The various methods of treatment have been discussed with the patient and family. After consideration of risks, benefits and other options for treatment, the patient has consented to  Procedure(s): COLONOSCOPY WITH PROPOFOL (N/A) as a surgical intervention .  The patient's history has been reviewed, patient examined, no change in status, stable for surgery.  I have reviewed the patient's chart and labs.  Questions were answered to the patient's satisfaction.     [place] C.

## 2017-01-09 ENCOUNTER — Ambulatory Visit (INDEPENDENT_AMBULATORY_CARE_PROVIDER_SITE_OTHER): Payer: 59 | Admitting: Family

## 2017-01-09 DIAGNOSIS — L03115 Cellulitis of right lower limb: Secondary | ICD-10-CM | POA: Diagnosis not present

## 2017-01-09 NOTE — Progress Notes (Addendum)
Pt came in for what he says is his "last [employer] change".  He had already taken the prior unna boot off in preparation for his visit with [place] today.  I cleansed his R leg and applied a new [employer] without any complications.  Pt will contact the office if he has any further issues.  Ashleigh E., LPN.  According to Dr. Oneida Alar progress note on [phone], return in 3 months with Dr. Kellie Simmering or Dr. Donnetta Hutching for consideration of laser ablation. Cody Level Nickel, RN, MSN-FNP-C

## 2017-02-27 ENCOUNTER — Encounter: Payer: Self-pay | Admitting: Internal Medicine

## 2017-03-05 ENCOUNTER — Encounter: Payer: Self-pay | Admitting: Vascular Surgery

## 2017-03-17 NOTE — Addendum Note (Signed)
Addendum  created 03/17/17 1245 by Myrtie Soman, MD   Sign clinical note

## 2017-03-18 ENCOUNTER — Ambulatory Visit: Payer: 59 | Admitting: Vascular Surgery

## 2017-04-25 ENCOUNTER — Emergency Department (HOSPITAL_COMMUNITY): Payer: Managed Care, Other (non HMO)

## 2017-04-25 ENCOUNTER — Emergency Department (HOSPITAL_COMMUNITY)
Admission: EM | Admit: 2017-04-25 | Discharge: 2017-04-25 | Disposition: A | Discharge status: Home / Self Care | Payer: Managed Care, Other (non HMO) | Attending: Emergency Medicine | Admitting: Emergency Medicine

## 2017-04-25 ENCOUNTER — Encounter (HOSPITAL_COMMUNITY): Payer: Self-pay | Admitting: Emergency Medicine

## 2017-04-25 DIAGNOSIS — J0100 Acute maxillary sinusitis, unspecified: Secondary | ICD-10-CM

## 2017-04-25 DIAGNOSIS — F17210 Nicotine dependence, cigarettes, uncomplicated: Secondary | ICD-10-CM | POA: Diagnosis not present

## 2017-04-25 DIAGNOSIS — R404 Transient alteration of awareness: Secondary | ICD-10-CM | POA: Insufficient documentation

## 2017-04-25 DIAGNOSIS — I10 Essential (primary) hypertension: Secondary | ICD-10-CM | POA: Diagnosis not present

## 2017-04-25 DIAGNOSIS — K047 Periapical abscess without sinus: Secondary | ICD-10-CM | POA: Diagnosis not present

## 2017-04-25 DIAGNOSIS — R220 Localized swelling, mass and lump, head: Secondary | ICD-10-CM

## 2017-04-25 DIAGNOSIS — R51 Headache: Secondary | ICD-10-CM | POA: Diagnosis present

## 2017-04-25 DIAGNOSIS — R1031 Right lower quadrant pain: Secondary | ICD-10-CM | POA: Insufficient documentation

## 2017-04-25 DIAGNOSIS — Z794 Long term (current) use of insulin: Secondary | ICD-10-CM | POA: Insufficient documentation

## 2017-04-25 DIAGNOSIS — R42 Dizziness and giddiness: Secondary | ICD-10-CM | POA: Diagnosis not present

## 2017-04-25 LAB — COMPREHENSIVE METABOLIC PANEL
ALT: 13 U/L — ABNORMAL LOW (ref 17–63)
AST: 12 U/L — ABNORMAL LOW (ref 15–41)
Albumin: 3.9 g/dL (ref 3.5–5.0)
Alkaline Phosphatase: 89 U/L (ref 38–126)
Anion gap: 11 (ref 5–15)
BILIRUBIN TOTAL: 0.6 mg/dL (ref 0.3–1.2)
BUN: 10 mg/dL (ref 6–20)
CO2: 22 mmol/L (ref 22–32)
Calcium: 9.3 mg/dL (ref 8.9–10.3)
Chloride: 95 mmol/L — ABNORMAL LOW (ref 101–111)
Creatinine, Ser: 0.89 mg/dL (ref 0.61–1.24)
Glucose, Bld: 388 mg/dL — ABNORMAL HIGH (ref 65–99)
POTASSIUM: 4.3 mmol/L (ref 3.5–5.1)
Sodium: 128 mmol/L — ABNORMAL LOW (ref 135–145)
TOTAL PROTEIN: 7.2 g/dL (ref 6.5–8.1)

## 2017-04-25 LAB — URINALYSIS, ROUTINE W REFLEX MICROSCOPIC
BACTERIA UA: NONE SEEN
BILIRUBIN URINE: NEGATIVE
Glucose, UA: >=500 mg/dL — AB
Hgb urine dipstick: NEGATIVE
Ketones, ur: NEGATIVE mg/dL
Leukocytes, UA: NEGATIVE
NITRITE: NEGATIVE
PH: 5 (ref 5.0–8.0)
Protein, ur: NEGATIVE mg/dL
RBC / HPF: NONE SEEN RBC/hpf (ref 0–5)

## 2017-04-25 LAB — PROTIME-INR
INR: 1.02
Prothrombin Time: 13.4 seconds (ref 11.4–15.2)

## 2017-04-25 LAB — DIFFERENTIAL
Basophils Absolute: 0 10*3/uL (ref 0.0–0.1)
Basophils Relative: 0 %
EOS ABS: 0.1 10*3/uL (ref 0.0–0.7)
Eosinophils Relative: 1 %
LYMPHS ABS: 2.3 10*3/uL (ref 0.7–4.0)
Lymphocytes Relative: 22 %
Monocytes Absolute: 0.8 10*3/uL (ref 0.1–1.0)
Monocytes Relative: 8 %
NEUTROS PCT: 69 %
Neutro Abs: 7.1 10*3/uL (ref 1.7–7.7)

## 2017-04-25 LAB — CBG MONITORING, ED: GLUCOSE-CAPILLARY: 381 mg/dL — AB (ref 65–99)

## 2017-04-25 LAB — I-STAT CHEM 8, ED
BUN: 12 mg/dL (ref 6–20)
CALCIUM ION: 1.07 mmol/L — AB (ref 1.15–1.40)
Chloride: 97 mmol/L — ABNORMAL LOW (ref 101–111)
Creatinine, Ser: 0.6 mg/dL — ABNORMAL LOW (ref 0.61–1.24)
Glucose, Bld: 388 mg/dL — ABNORMAL HIGH (ref 65–99)
HCT: 48 % (ref 39.0–52.0)
Hemoglobin: 16.3 g/dL (ref 13.0–17.0)
Potassium: 4.3 mmol/L (ref 3.5–5.1)
SODIUM: 133 mmol/L — AB (ref 135–145)
TCO2: 24 mmol/L (ref 0–100)

## 2017-04-25 LAB — APTT: aPTT: 26 seconds (ref 24–36)

## 2017-04-25 LAB — CBC
HEMATOCRIT: 44.3 % (ref 39.0–52.0)
HEMOGLOBIN: 15.6 g/dL (ref 13.0–17.0)
MCH: 29.8 pg (ref 26.0–34.0)
MCHC: 35.2 g/dL (ref 30.0–36.0)
MCV: 84.7 fL (ref 78.0–100.0)
Platelets: 260 10*3/uL (ref 150–400)
RBC: 5.23 MIL/uL (ref 4.22–5.81)
RDW: 13.7 % (ref 11.5–15.5)
WBC: 10.3 10*3/uL (ref 4.0–10.5)

## 2017-04-25 LAB — I-STAT TROPONIN, ED: TROPONIN I, POC: 0.01 ng/mL (ref 0.00–0.08)

## 2017-04-25 LAB — I-STAT CG4 LACTIC ACID, ED: Lactic Acid, Venous: 1.62 mmol/L (ref 0.5–1.9)

## 2017-04-25 MED ORDER — PIPERACILLIN-TAZOBACTAM 3.375 G IVPB 30 MIN
3.3750 g | Freq: Once | INTRAVENOUS | Status: AC
  Administered 2017-04-25: 3.375 g via INTRAVENOUS
  Filled 2017-04-25: qty 50

## 2017-04-25 MED ORDER — MECLIZINE HCL 25 MG PO TABS
25.0000 mg | ORAL_TABLET | Freq: Once | ORAL | Status: AC
  Administered 2017-04-25: 25 mg via ORAL
  Filled 2017-04-25: qty 1

## 2017-04-25 MED ORDER — METRONIDAZOLE 500 MG PO TABS
500.0000 mg | ORAL_TABLET | Freq: Once | ORAL | Status: AC
  Administered 2017-04-25: 500 mg via ORAL
  Filled 2017-04-25: qty 1

## 2017-04-25 MED ORDER — ACETAMINOPHEN 500 MG PO TABS: 1000.0000 mg | ORAL_TABLET | Freq: Four times a day (QID) | ORAL | 0 refills | Status: AC | PRN

## 2017-04-25 MED ORDER — INSULIN ASPART 100 UNIT/ML ~~LOC~~ SOLN
8.0000 [IU] | Freq: Once | SUBCUTANEOUS | Status: AC
  Administered 2017-04-25: 8 [IU] via SUBCUTANEOUS
  Filled 2017-04-25: qty 1

## 2017-04-25 MED ORDER — CHLORHEXIDINE GLUCONATE 0.12 % MT SOLN: 15.0000 mL | Freq: Two times a day (BID) | OROMUCOSAL | 0 refills | Status: AC

## 2017-04-25 MED ORDER — METRONIDAZOLE 500 MG PO TABS: 500.0000 mg | ORAL_TABLET | Freq: Two times a day (BID) | ORAL | 0 refills | Status: AC

## 2017-04-25 MED ORDER — PENICILLIN V POTASSIUM 500 MG PO TABS: 1000.0000 mg | ORAL_TABLET | Freq: Two times a day (BID) | ORAL | 0 refills | Status: AC

## 2017-04-25 MED ORDER — SODIUM CHLORIDE 0.9 % IV BOLUS (SEPSIS)
1000.0000 mL | Freq: Once | INTRAVENOUS | Status: AC
  Administered 2017-04-25: 1000 mL via INTRAVENOUS

## 2017-04-25 MED ORDER — IOPAMIDOL (ISOVUE-300) INJECTION 61%
INTRAVENOUS | Status: AC
  Administered 2017-04-25: 100 mL
  Filled 2017-04-25: qty 100

## 2017-04-25 MED ORDER — SODIUM CHLORIDE 0.9 % IV SOLN
Freq: Once | INTRAVENOUS | Status: AC
  Administered 2017-04-25: 125 mL/h via INTRAVENOUS

## 2017-04-25 MED ORDER — MECLIZINE HCL 25 MG PO TABS: 25.0000 mg | ORAL_TABLET | Freq: Three times a day (TID) | ORAL | 0 refills | Status: AC | PRN

## 2017-04-25 NOTE — ED Provider Notes (Signed)
[facility] DEPT Provider Note   CSN: [id number] Arrival date & time: 04/25/17  0626     History   Chief Complaint Chief Complaint  Patient presents with  . Altered Mental Status    HPI HARI CASAUS is a 51 y.o. male.  HPI Patient states he developed a headache yesterday with some dizziness. He reports that initially it was not that severe. He took some Tylenol and felt sleepy so rested for part of the day. He reports the pain is mostly in his forehead and around his face. He reports he had dizziness that was spinning in quality. He did not identify any fever or other associated symptoms. I did point out that he has facial swelling and dental abscess, he reports he did start to notice that yesterday. Today he went to work. He reports he still felt somewhat dizzy but drove himself to work. 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 Patient's last hospital admission was in January for sepsis secondary to right lower extremity cellulitis and venous ulcer. He reports he still has problems with pain and has an ulcer on the right leg at this time. He reports he chronically has numbness and sensory problems with the right foot due to his chronic venous stasis. Past Medical History:  Diagnosis Date  . Arthritis    "fingers" (10/11/2016)  . Cellulitis of right leg 11/12/2014; 10/11/2016  . Diabetes type 2, uncontrolled ([place]) 03/20/2011  . Diabetic foot ulcer ([place]) 03/20/2011  . Elevated TSH 11/13/2014  . Hypertension   . Obesity, unspecified 05/31/2014  . Other and unspecified hyperlipidemia 05/31/2014  . Pneumonia 2016  . Superficial vein thrombosis 06/01/2014  . Syncope 05/30/2014  . Tendinitis of elbow or forearm 11/13/2014  . Tobacco use disorder 05/31/2014  . 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 Vein, varicose 03/20/2011     Patient Active Problem List   Diagnosis Date Noted  . Special screening for malignant neoplasm of colon 01/08/2017  . Cellulitis 10/10/2016  . Venous stasis of both lower extremities 08/05/2016  . Sepsis ([place]) 08/05/2016  . Elevated TSH 11/13/2014  . Tendinitis of elbow or forearm 11/13/2014  . Cellulitis of right leg 11/12/2014  . Right leg pain 05/31/2014  . Other and unspecified hyperlipidemia 05/31/2014  . Obesity 05/31/2014  . Tobacco use disorder 05/31/2014  . Diabetes type 2, uncontrolled ([place]) 03/20/2011  . Vein, varicose 03/20/2011    Past Surgical History:  Procedure Laterality Date  . APPENDECTOMY  1988  . COLONOSCOPY WITH PROPOFOL N/A 01/08/2017   Procedure: COLONOSCOPY WITH PROPOFOL;  Surgeon: Wilford Corner, MD;  Location: [facility];  Service: Endoscopy;  Laterality: N/A;       Home Medications    Prior to Admission medications   Medication Sig Start Date End Date Taking? Authorizing Provider  aspirin 325 MG EC tablet Take 325 mg by mouth every 6 (six) hours as needed for pain.    Yes [provider]  insulin aspart protamine- aspart (NOVOLOG MIX 70/30) (70-30) 100 UNIT/ML injection Inject 0.38 mLs (38 Units total) into the skin 2 (two) times daily with a meal. Patient taking differently: Inject 30 Units into the skin 2 (two) times daily.  11/04/16  Yes Langeland, Dawn T, MD  acetaminophen (TYLENOL) 500 MG tablet Take 2 tablets (1,000 mg total) by mouth every  6 (six) hours as needed. 04/25/17   Charlesetta Shanks, MD  aspirin EC 81 MG tablet Take 1 tablet (81 mg total) by mouth daily. Patient not taking: Reported on 04/25/2017 11/04/16   Lottie Mussel T, MD  chlorhexidine (PERIDEX) 0.12 % solution Use as directed 15 mLs in the mouth or throat 2 (two) times daily. 04/25/17   Charlesetta Shanks, MD  lisinopril-hydrochlorothiazide (PRINZIDE,ZESTORETIC) 10-12.5 MG tablet Take 1 tablet by mouth daily. Patient not taking: Reported on 04/25/2017 11/04/16    Maren Reamer, MD  meclizine (ANTIVERT) 25 MG tablet Take 1 tablet (25 mg total) by mouth 3 (three) times daily as needed for dizziness. 04/25/17   Charlesetta Shanks, MD  metFORMIN (GLUCOPHAGE) 1000 MG tablet Take 1 tablet (1,000 mg total) by mouth 2 (two) times daily with a meal. Patient not taking: Reported on 04/25/2017 11/04/16   Maren Reamer, MD  metroNIDAZOLE (FLAGYL) 500 MG tablet Take 1 tablet (500 mg total) by mouth 2 (two) times daily. One po bid x 7 days 04/25/17   Charlesetta Shanks, MD  penicillin v potassium (VEETID) 500 MG tablet Take 2 tablets (1,000 mg total) by mouth 2 (two) times daily. X 7 days 04/25/17   Charlesetta Shanks, MD  pravastatin (PRAVACHOL) 40 MG tablet Take 1 tablet (40 mg total) by mouth daily. Patient not taking: Reported on 01/06/2017 11/05/16   Maren Reamer, MD    Family History Family History  Problem Relation Age of Onset  . Cancer Mother        breast cancer   . Hypertension Mother   . Hypertension Father     Social History Social History  Substance Use Topics  . Smoking status: Current Every Day Smoker    Packs/day: 1.00    Years: 40.00    Types: Cigarettes  . Smokeless tobacco: Former Systems developer    Types: Chew     Comment: "chewed for ~ 1 month in my teens"  . Alcohol use No     Comment: quit 2000     Allergies   Patient has no known allergies.   Review of Systems Review of Systems 10 Systems reviewed and are negative for acute change except as noted in the HPI.  Physical Exam Updated Vital Signs BP 135/87 (BP Location: Right Arm)   Pulse 80   Temp 98.7 F (37.1 C)   Resp 18   Ht 6\' 3"  (1.905 m)   Wt 131.1 kg (289 lb)   SpO2 96%   BMI 36.12 kg/m   Physical Exam  Constitutional: He is oriented to person, place, and time. He appears well-developed and well-nourished.  Patient is alert and nontoxic. He has mild appearance of illness. No respiratory distress. Mental status clear.  HENT:  One and half centimeter nodule to right  temple area. This is soft and slightly tender. Has the consistency of a soft cyst. (Patient is unsure if this was pre-existing) moderate swelling in the nasal labial fold of the left side of the face. Extreme poor dentition with necrotizing gingivitis. Fetid breath. Posterior airway widely patent. No difficulty handling secretions, no swelling, no trismus bilateral TMs normal.  Eyes: Pupils are equal, round, and reactive to light. Conjunctivae and EOM are normal.  Neck: Neck supple.  No meningismus.  Cardiovascular: Normal rate, regular rhythm, normal heart sounds and intact distal pulses.   Pulmonary/Chest: Effort normal and breath sounds normal.  Abdominal: Soft. There is tenderness.  Right lower quadrant very tender to palpation. Patient reports this  just started after arrival to the emergency department. No abdominal distention. No guarding.  Genitourinary:  Genitourinary Comments: Penis and scrotum normal appearance without edema or swelling. No appearance of intertriginous groin rashes.  Musculoskeletal:  Chronic venous stasis changes of right lower extremity with hyperpigmentation and thinning of skin. Evident varicosities. Dry flat eschar approximately 2.5 x 1 cm lower medial leg. No significant warmth or erythema to the leg. Foot is normal without edema. No erythema of the foot. Left lower extremity is normal without edema or erythema. Normal skin without thinning or signs of venous stasis. Normal range of motion bilateral upper extremities without evidence of injury.  Neurological: He is alert and oriented to person, place, and time. No cranial nerve deficit or sensory deficit. He exhibits normal muscle tone. Coordination normal.  Subtle lateral nice tightness. Patient reports vertiginous symptoms with position change. Finger-nose intact. Motor strength intact 4 extremities.  Skin: Skin is warm and dry.  Psychiatric: He has a normal mood and affect.     ED Treatments / Results   Labs (all labs ordered are listed, but only abnormal results are displayed) Labs Reviewed  COMPREHENSIVE METABOLIC PANEL - Abnormal; Notable for the following:       Result Value   Sodium 128 (*)    Chloride 95 (*)    Glucose, Bld 388 (*)    AST 12 (*)    ALT 13 (*)    All other components within normal limits  URINALYSIS, ROUTINE W REFLEX MICROSCOPIC - Abnormal; Notable for the following:    Specific Gravity, Urine >1.046 (*)    Glucose, UA >=500 (*)    Squamous Epithelial / LPF 0-5 (*)    All other components within normal limits  CBG MONITORING, ED - Abnormal; Notable for the following:    Glucose-Capillary 381 (*)    All other components within normal limits  I-STAT CHEM 8, ED - Abnormal; Notable for the following:    Sodium 133 (*)    Chloride 97 (*)    Creatinine, Ser 0.60 (*)    Glucose, Bld 388 (*)    Calcium, Ion 1.07 (*)    All other components within normal limits  CULTURE, BLOOD (ROUTINE X 2)  CULTURE, BLOOD (ROUTINE X 2)  PROTIME-INR  APTT  CBC  DIFFERENTIAL  I-STAT TROPOININ, ED  I-STAT CG4 LACTIC ACID, ED  I-STAT CG4 LACTIC ACID, ED    EKG  EKG Interpretation  Date/Time:  Friday April 25 2017 09:43:31 EDT Ventricular Rate:  93 PR Interval:    QRS Duration: 101 QT Interval:  371 QTC Calculation: 462 R Axis:   111 Text Interpretation:  Sinus rhythm Probable left atrial enlargement Consider right ventricular hypertrophy Inferior infarct, old Borderline ST elevation, anterior leads Confirmed by Charlesetta Shanks [phone]) on 04/25/2017 10:04:42 AM       Radiology Dg Chest 2 View  Result Date: 04/25/2017 CLINICAL DATA:  possible syncope started today ,headaches for 2 days EXAM: CHEST - 2 VIEW COMPARISON:  10/10/2016 FINDINGS: Lungs are clear. Heart size and mediastinal contours are within normal limits. No effusion. Visualized bones unremarkable. IMPRESSION: No acute cardiopulmonary disease. Electronically Signed   By: Lucrezia Europe M.D.   On: 04/25/2017  11:23   Ct Head Wo Contrast  Result Date: 04/25/2017 CLINICAL DATA:  dizzy with headache since yesterday.Arrived via EMS found at work in the freezer lethargic, pale, diaphoretic by EMS. Patient stated developed a headache with dizziness yesterday resolved with tylenol and returned today. States room  is spinning. EMS reported patient alert x 4 however at times forgetful. Upon arrival alert answering and following commands appropriate EXAM: CT HEAD WITHOUT CONTRAST TECHNIQUE: Contiguous axial images were obtained from the base of the skull through the vertex without intravenous contrast. COMPARISON:  03/01/2016 FINDINGS: Brain: No evidence of acute infarction, hemorrhage, hydrocephalus, extra-axial collection or mass lesion/mass effect. Vascular: No hyperdense vessel or unexpected calcification. Skull: Normal. Negative for fracture or focal lesion. Sinuses/Orbits: Maxillary sinus mucoperiosteal thickening left worse than right. Other: None. IMPRESSION: 1. Negative for bleed or other acute intracranial process. 2. Bilateral maxillary sinus disease Electronically Signed   By: Lucrezia Europe M.D.   On: 04/25/2017 11:17   Ct Abdomen Pelvis W Contrast  Result Date: 04/25/2017 CLINICAL DATA:  Right lower quadrant tenderness. Altered mental status. EXAM: CT ABDOMEN AND PELVIS WITH CONTRAST TECHNIQUE: Multidetector CT imaging of the abdomen and pelvis was performed using the standard protocol following bolus administration of intravenous contrast. CONTRAST:  100 cc Isovue 300 COMPARISON:  None. FINDINGS: Lower chest: No acute abnormality. Hepatobiliary: No focal liver abnormality is seen. No gallstones, gallbladder wall thickening, or biliary dilatation. Pancreas: Unremarkable. No pancreatic ductal dilatation or surrounding inflammatory changes. Spleen: Normal in size without focal abnormality. Adrenals/Urinary Tract: Adrenal glands are unremarkable. Kidneys are normal, without renal calculi, focal lesion, or  hydronephrosis. Bladder is unremarkable. Stomach/Bowel: Stomach is within normal limits. Appendix has been removed. No evidence of bowel wall thickening, distention, or inflammatory changes. Vascular/Lymphatic: No significant vascular findings are present. No enlarged abdominal or pelvic lymph nodes. Reproductive: Prostate is unremarkable. Other: No abdominal wall hernia or abnormality. No abdominopelvic ascites. Musculoskeletal: Degenerative disc disease in the lumbar spine. Moderately severe right facet arthritis at L5-S1. IMPRESSION: No acute abnormalities of the abdomen or pelvis. Electronically Signed   By: Lorriane Shire M.D.   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 Patient stated developed a headache with dizziness yesterday resolved with tylenol and returned today. States room is spinning. EMS reported patient alert x 4 however at times forgetful. Upon arrival alert answering and following commands appropriate EXAM: CT MAXILLOFACIAL WITH CONTRAST TECHNIQUE: Multidetector CT imaging of the maxillofacial structures was performed. Multiplanar CT image reconstructions were also generated. A small metallic BB was placed on the right temple in order to reliably differentiate right from left. CONTRAST:  100 mL Isovue-300 IV COMPARISON:  None. FINDINGS: Osseous: Negative for fracture. Temporomandibular joints are seated. Extensive dental caries. Orbits: Negative. No traumatic or inflammatory finding. Sinuses: Maxillary sinus mucoperiosteal thickening more extensive left than right. Remainder of paranasal sinuses and mastoid air cells appear normally developed and well aerated. Nasal septum midline. Soft tissues: Probable lipoma anterior to the left masseter. Otherwise Negative. Limited intracranial: No significant or unexpected finding.  IMPRESSION: 1. No acute findings. 2. Maxillary sinus disease. 3. Dental caries. Electronically Signed   By: Lucrezia Europe M.D.   On: 04/25/2017 11:23    Procedures Procedures (including critical care time)  Medications Ordered in ED Medications  0.9 %  sodium chloride infusion ( Intravenous Stopped 04/25/17 1502)  piperacillin-tazobactam (ZOSYN) IVPB 3.375 g (0 g Intravenous Stopped 04/25/17 1230)  iopamidol (ISOVUE-300) 61 % injection (100 mLs  Contrast Given 04/25/17 1036)  metroNIDAZOLE (FLAGYL) tablet 500 mg (500 mg Oral Given 04/25/17 1148)  insulin aspart (novoLOG) injection 8 Units (8 Units Subcutaneous Given  04/25/17 1149)  sodium chloride 0.9 % bolus 1,000 mL (0 mLs Intravenous Stopped 04/25/17 1255)  meclizine (ANTIVERT) tablet 25 mg (25 mg Oral Given 04/25/17 1148)     Initial Impression / Assessment and Plan / ED Course  I have reviewed the triage vital signs and the nursing notes.  Pertinent labs & imaging results that were available during my care of the patient were reviewed by me and considered in my medical decision making (see chart for details).     Final Clinical Impressions(s) / ED Diagnoses   Final diagnoses:  Acute non-recurrent maxillary sinusitis  Apical abscess  Transient alteration of awareness  Vertigo   Patient is alert and nontoxic. Vital signs have been stable. Diagnostic workup does not show facial abscess, no lactic acidosis or leukocytosis. I do feel the patient's symptoms of described headache onset yesterday and dizziness are due to sinusitis and apical abscess. He does have a focus of mild to moderate facial swelling associated with dental abscess. He did not show a fluid collection and I cannot find a focal abscess pocket he does have severe dental decay and gingivitis. She is counseled on the importance of getting this managed with a dentist given his diabetes and risk for disseminating infection. At this time however no evidence of systemic infection.  Patient reports he can monitor his blood sugars at home with his monitor and will follow-up at the beginning of the week. New Prescriptions New Prescriptions   ACETAMINOPHEN (TYLENOL) 500 MG TABLET    Take 2 tablets (1,000 mg total) by mouth every 6 (six) hours as needed.   CHLORHEXIDINE (PERIDEX) 0.12 % SOLUTION    Use as directed 15 mLs in the mouth or throat 2 (two) times daily.   MECLIZINE (ANTIVERT) 25 MG TABLET    Take 1 tablet (25 mg total) by mouth 3 (three) times daily as needed for dizziness.   METRONIDAZOLE (FLAGYL) 500 MG TABLET    Take 1 tablet (500 mg total) by mouth 2 (two) times daily. One po bid x 7 days   PENICILLIN V POTASSIUM (VEETID) 500 MG TABLET    Take 2 tablets (1,000 mg total) by mouth 2 (two) times daily. X 7 days     Charlesetta Shanks, MD 04/25/17 [phone]

## 2017-04-25 NOTE — ED Notes (Signed)
Patient continues to be in radiology 

## 2017-04-25 NOTE — ED Notes (Signed)
Pt given Sprite per Greg(RN)

## 2017-04-25 NOTE — Discharge Instructions (Signed)
You must see a dentist as soon as possible. A referral list for dental clinics has been attached to her discharge instructions. Monitor your blood sugars and use your insulin. If her blood sugar remains poorly controlled, if you develop fever or worsening symptoms return to the emergency department. You must see her doctor by Monday or Tuesday this week.

## 2017-04-25 NOTE — ED Triage Notes (Signed)
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 Patient stated developed a headache with dizziness yesterday resolved with tylenol and returned today. States room is spinning. EMS reported patient alert x 4 however at times forgetful. Upon arrival alert answering and following commands appropriate.

## 2017-04-25 NOTE — ED Notes (Signed)
Pt given water per Greg(RN)

## 2017-04-25 NOTE — ED Notes (Signed)
Returned from radiology. 

## 2017-04-25 NOTE — ED Notes (Signed)
Pt ambulated in hallway no complaints seen by Greg(RN) & Dr. Johnney Killian

## 2017-04-30 LAB — CULTURE, BLOOD (ROUTINE X 2)
CULTURE: NO GROWTH
Culture: NO GROWTH
SPECIAL REQUESTS: ADEQUATE
Special Requests: ADEQUATE

## 2018-03-14 IMAGING — MR MR [PERSON_NAME] LOW WO/W CM*R*
4 of 8 series · 19 of 40 positions shown · IV contrast (multihance)
Comparison: None.

CLINICAL DATA: Uncontrolled diabetic with right leg pain, swelling
and fever.

EXAM:
MRI OF LOWER RIGHT EXTREMITY WITHOUT AND WITH CONTRAST
TECHNIQUE: Multiplanar, multisequence MR imaging of the right leg was performed
both before and after administration of intravenous contrast.
CONTRAST:  20mL MULTIHANCE GADOBENATE DIMEGLUMINE 529 MG/ML IV SOLN

[Series 5: T1 · coronal · 5.0mm · 0.90mm/px · 4 of 30 slices shown (1 of 2)]
[im 1/30]
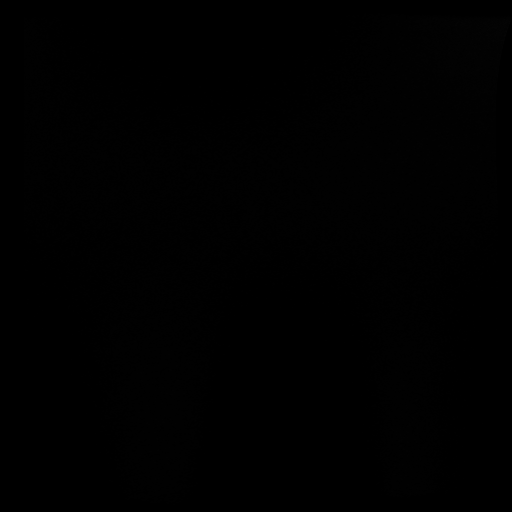
[im 10/30]
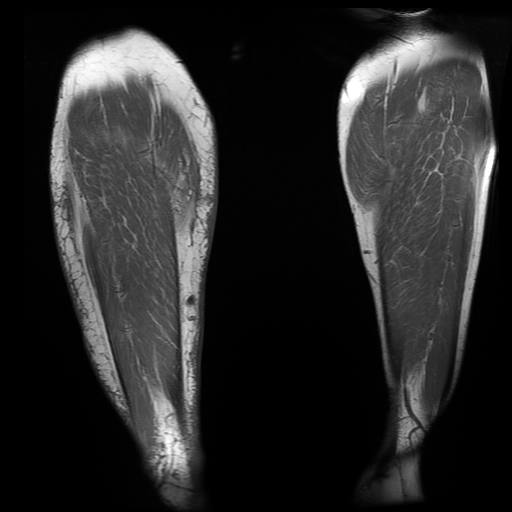
[im 20/30]
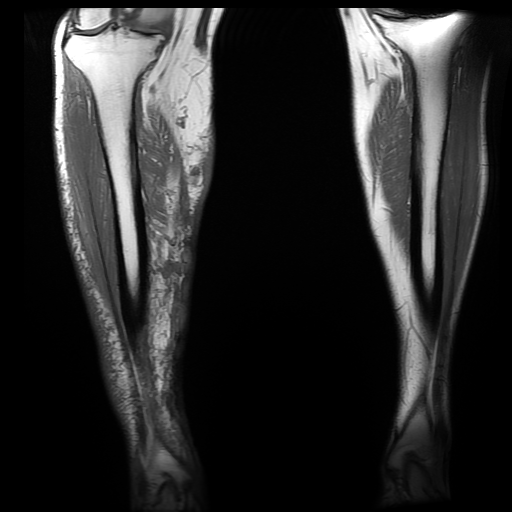
[im 30/30]
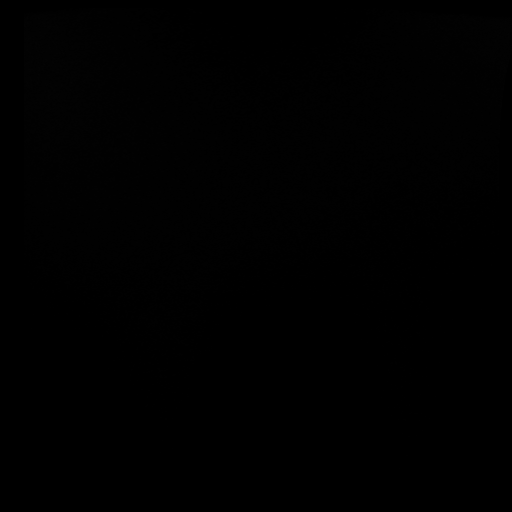

[Series 7: T1 · axial · 7.0mm · 0.86mm/px · z∈[-187,+263]mm · 6 of 51 slices shown (2 of 2)]
[im 1/51]
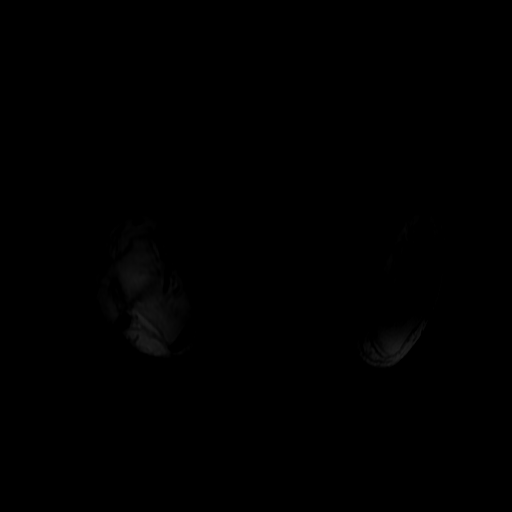
[im 11/51]
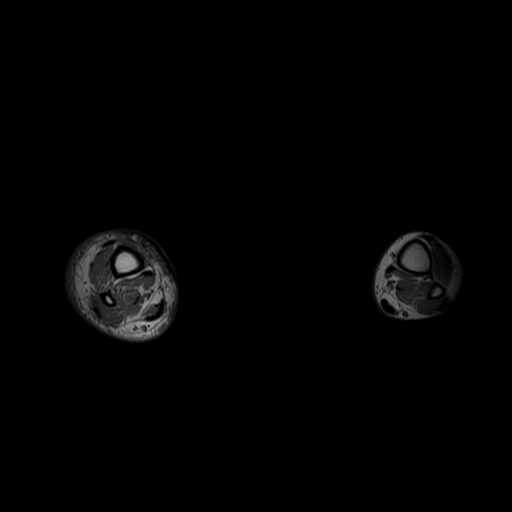
[im 21/51]
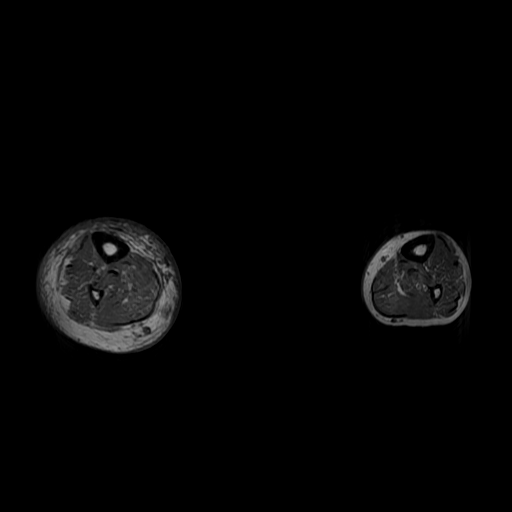
[im 31/51]
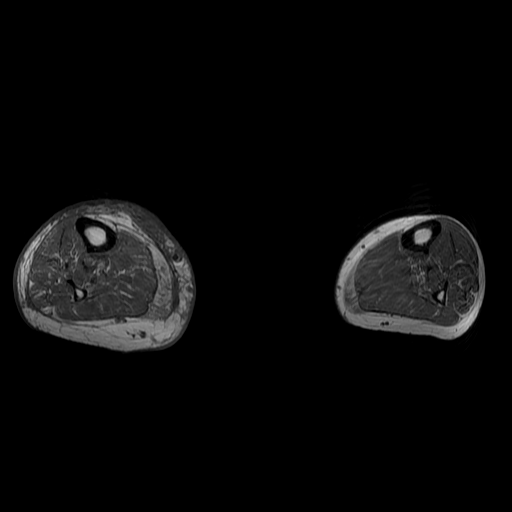
[im 41/51]
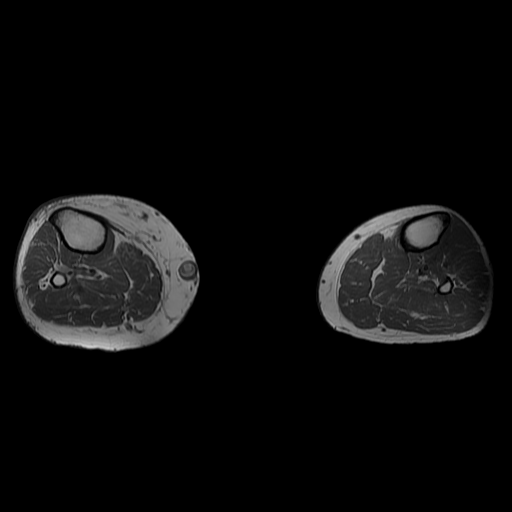
[im 51/51]
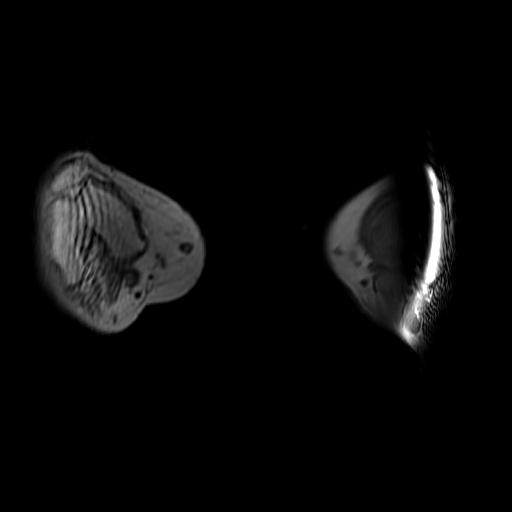

[Series 8: T2 fat-sat · axial · 7.0mm · 0.86mm/px · z∈[-187,+263]mm · 6 of 51 slices shown]
[im 1/51]
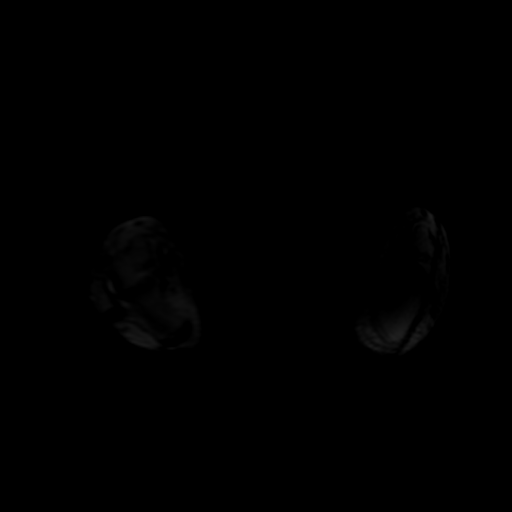
[im 11/51]
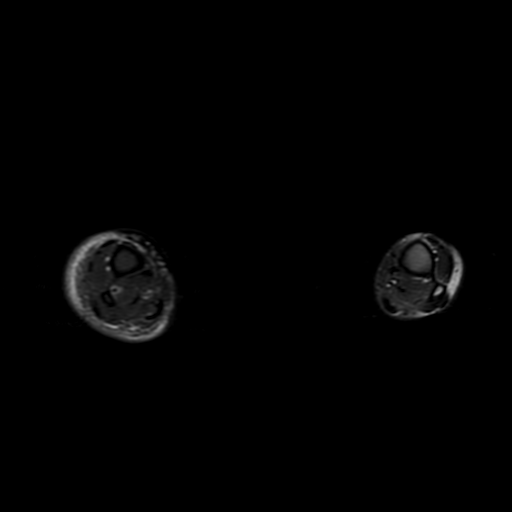
[im 21/51]
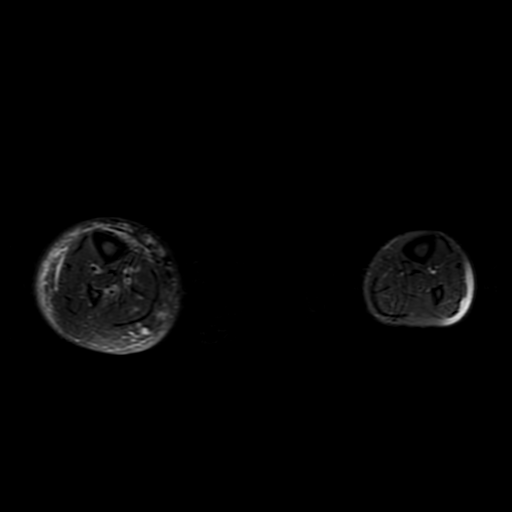
[im 31/51]
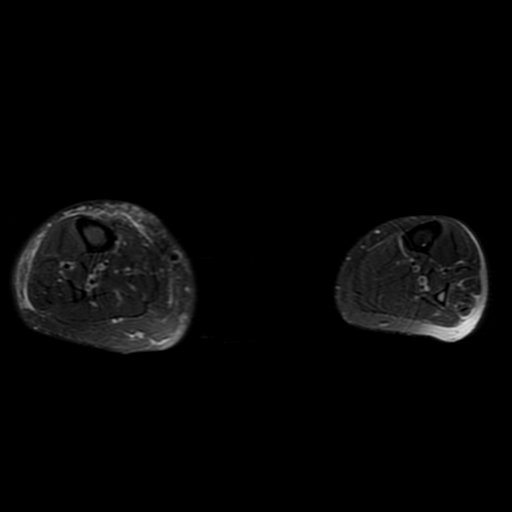
[im 41/51]
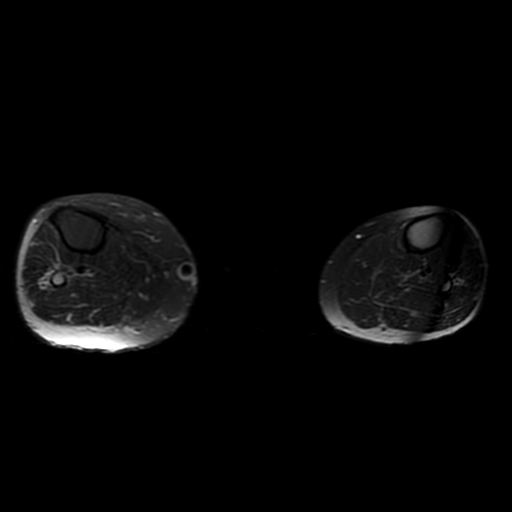
[im 51/51]
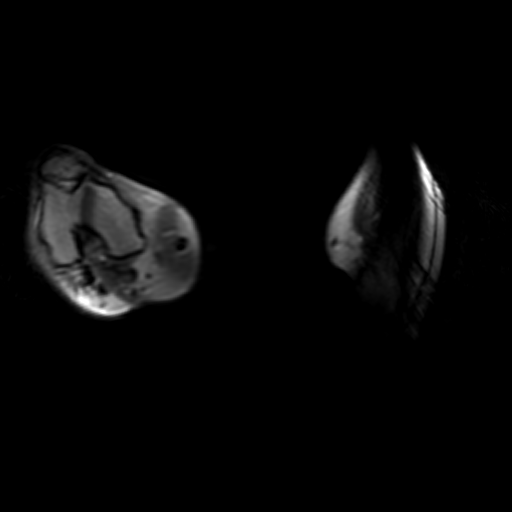

[Series 10: T1 fat-sat · axial · non-contrast · 7.0mm · 0.86mm/px · z∈[-97,+263]mm · 3 of 51 slices shown]
[im 11/51]
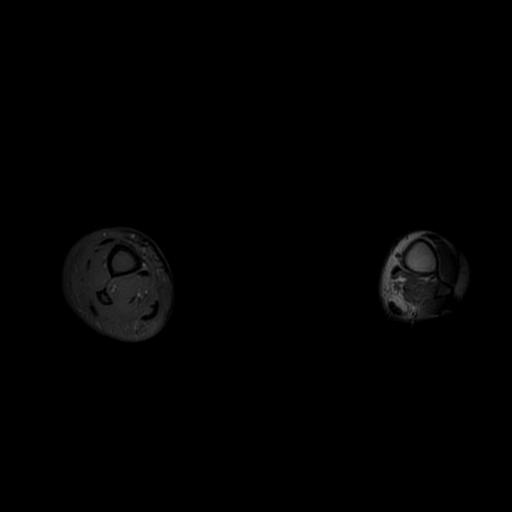
[im 31/51]
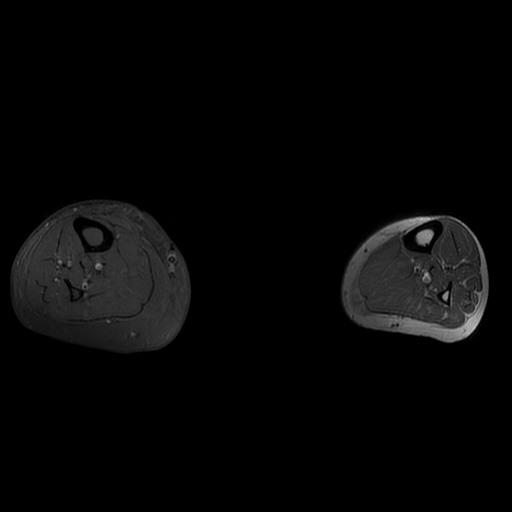
[im 51/51]
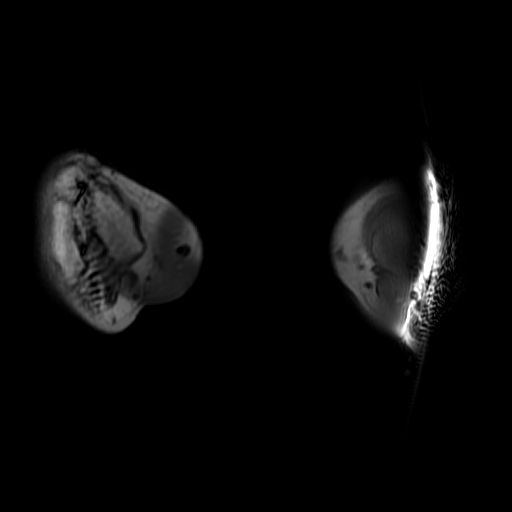

[19 of 40 positions shown; findings below may reference images not displayed]

FINDINGS: Bones/Joint/Cartilage

No findings of osteomyelitis.  No fracture bone destruction.

Ligaments

Negative

Muscles and Tendons

No intramuscular mass, abscess or hematoma.

Soft tissues

Soft tissue edema swelling of the right leg consistent with
cellulitis. Superficial varicose veins of the right leg medially.
IMPRESSION: Cellulitis of the right leg without evidence of osteomyelitis.

## 2018-10-28 ENCOUNTER — Other Ambulatory Visit: Payer: Self-pay

## 2018-10-28 ENCOUNTER — Encounter (HOSPITAL_COMMUNITY): Payer: Self-pay | Admitting: Emergency Medicine

## 2018-10-28 ENCOUNTER — Emergency Department (HOSPITAL_COMMUNITY)
Admission: EM | Admit: 2018-10-28 | Discharge: 2018-10-28 | Disposition: A | Discharge status: Home / Self Care | Payer: 59 | Attending: Emergency Medicine | Admitting: Emergency Medicine

## 2018-10-28 DIAGNOSIS — E119 Type 2 diabetes mellitus without complications: Secondary | ICD-10-CM | POA: Insufficient documentation

## 2018-10-28 DIAGNOSIS — L02412 Cutaneous abscess of left axilla: Secondary | ICD-10-CM

## 2018-10-28 DIAGNOSIS — F17210 Nicotine dependence, cigarettes, uncomplicated: Secondary | ICD-10-CM | POA: Insufficient documentation

## 2018-10-28 MED ORDER — SULFAMETHOXAZOLE-TRIMETHOPRIM 800-160 MG PO TABS: 1.0000 | ORAL_TABLET | Freq: Two times a day (BID) | ORAL | 0 refills | Status: AC

## 2018-10-28 MED ORDER — LIDOCAINE-EPINEPHRINE (PF) 2 %-1:200000 IJ SOLN
10.0000 mL | Freq: Once | INTRAMUSCULAR | Status: AC
  Administered 2018-10-28: 10 mL
  Filled 2018-10-28: qty 20

## 2018-10-28 MED ORDER — SULFAMETHOXAZOLE-TRIMETHOPRIM 800-160 MG PO TABS
2.0000 | ORAL_TABLET | Freq: Once | ORAL | Status: AC
  Administered 2018-10-28: 2 via ORAL
  Filled 2018-10-28: qty 2

## 2018-10-28 NOTE — ED Notes (Signed)
ED Provider at bedside. 

## 2018-10-28 NOTE — ED Triage Notes (Signed)
Pt here for an abscess in his left armpit , hx of same

## 2018-10-28 NOTE — ED Provider Notes (Signed)
[facility] EMERGENCY DEPARTMENT Provider Note   CSN: [id number] Arrival date & time: 10/28/18  1024     History   Chief Complaint Chief Complaint  Patient presents with  . Abscess    HPI Cody Harper is a 53 y.o. male.  53 year old male with prior medical history as detailed below presents for evaluation of abscess in the left armpit.  Patient reports that this is been a problem for the last 4 to 5 days.  The abscesses grown.  He reports prior history of same approximately 15 years ago.  He denies associated fever.  He denies drainage from the area.  The history is provided by the patient and medical records.  Abscess  Abscess location: Left axilla. Size:  3 x 3 Abscess quality: induration, painful and redness   Abscess quality: not draining   Red streaking: no   Duration:  4 days Progression:  Worsening Pain details:    Quality:  Pressure and dull   Severity:  Moderate   Duration:  4 days   Timing:  Constant   Progression:  Worsening Chronicity:  New Context: diabetes   Relieved by:  Nothing Worsened by:  Nothing   Past Medical History:  Diagnosis Date  . Arthritis    "fingers" (10/11/2016)  . Cellulitis of right leg 11/12/2014; 10/11/2016  . Diabetes type 2, uncontrolled ([place]) 03/20/2011  . Diabetic foot ulcer ([place]) 03/20/2011  . Elevated TSH 11/13/2014  . Hypertension   . Obesity, unspecified 05/31/2014  . Other and unspecified hyperlipidemia 05/31/2014  . Pneumonia 2016  . Superficial vein thrombosis 06/01/2014  . Syncope 05/30/2014  . Tendinitis of elbow or forearm 11/13/2014  . Tobacco use disorder 05/31/2014  . 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 Vein, varicose 03/20/2011    Patient Active Problem List   Diagnosis Date Noted  . Special screening for malignant neoplasm of colon 01/08/2017  . Cellulitis 10/10/2016  . Venous stasis of both lower extremities 08/05/2016  . Sepsis ([place])  08/05/2016  . Elevated TSH 11/13/2014  . Tendinitis of elbow or forearm 11/13/2014  . Cellulitis of right leg 11/12/2014  . Right leg pain 05/31/2014  . Other and unspecified hyperlipidemia 05/31/2014  . Obesity 05/31/2014  . Tobacco use disorder 05/31/2014  . Diabetes type 2, uncontrolled ([place]) 03/20/2011  . Vein, varicose 03/20/2011    Past Surgical History:  Procedure Laterality Date  . APPENDECTOMY  1988  . COLONOSCOPY WITH PROPOFOL N/A 01/08/2017   Procedure: COLONOSCOPY WITH PROPOFOL;  Surgeon: Wilford Corner, MD;  Location: [facility];  Service: Endoscopy;  Laterality: N/A;        Home Medications    Prior to Admission medications   Medication Sig Start Date End Date Taking? Authorizing Provider  acetaminophen (TYLENOL) 500 MG tablet Take 2 tablets (1,000 mg total) by mouth every 6 (six) hours as needed. 04/25/17   Charlesetta Shanks, MD  aspirin 325 MG EC tablet Take 325 mg by mouth every 6 (six) hours as needed for pain.     [provider]  aspirin EC 81 MG tablet Take 1 tablet (81 mg total) by mouth daily. Patient not taking: Reported on 04/25/2017 11/04/16   Lottie Mussel T, MD  chlorhexidine (PERIDEX) 0.12 % solution Use as directed 15 mLs in the mouth or throat 2 (two) times daily. 04/25/17   Charlesetta Shanks, MD  insulin aspart protamine- aspart (NOVOLOG MIX 70/30) (70-30) 100  UNIT/ML injection Inject 0.38 mLs (38 Units total) into the skin 2 (two) times daily with a meal. Patient taking differently: Inject 30 Units into the skin 2 (two) times daily.  11/04/16   Maren Reamer, MD  lisinopril-hydrochlorothiazide (PRINZIDE,ZESTORETIC) 10-12.5 MG tablet Take 1 tablet by mouth daily. Patient not taking: Reported on 04/25/2017 11/04/16   Maren Reamer, MD  meclizine (ANTIVERT) 25 MG tablet Take 1 tablet (25 mg total) by mouth 3 (three) times daily as needed for dizziness. 04/25/17   Charlesetta Shanks, MD  metFORMIN (GLUCOPHAGE) 1000 MG tablet Take 1 tablet (1,000  mg total) by mouth 2 (two) times daily with a meal. Patient not taking: Reported on 04/25/2017 11/04/16   Maren Reamer, MD  metroNIDAZOLE (FLAGYL) 500 MG tablet Take 1 tablet (500 mg total) by mouth 2 (two) times daily. One po bid x 7 days 04/25/17   Charlesetta Shanks, MD  penicillin v potassium (VEETID) 500 MG tablet Take 2 tablets (1,000 mg total) by mouth 2 (two) times daily. X 7 days 04/25/17   Charlesetta Shanks, MD  pravastatin (PRAVACHOL) 40 MG tablet Take 1 tablet (40 mg total) by mouth daily. Patient not taking: Reported on 01/06/2017 11/05/16   Maren Reamer, MD  sulfamethoxazole-trimethoprim (BACTRIM DS,SEPTRA DS) 800-160 MG tablet Take 1 tablet by mouth 2 (two) times daily for 7 days. 10/28/18 11/04/18  Valarie Merino, MD    Family History Family History  Problem Relation Age of Onset  . Cancer Mother        breast cancer   . Hypertension Mother   . Hypertension Father     Social History Social History   Tobacco Use  . Smoking status: Current Every Day Smoker    Packs/day: 1.00    Years: 40.00    Pack years: 40.00    Types: Cigarettes  . Smokeless tobacco: Former Systems developer    Types: Chew  . Tobacco comment: "chewed for ~ 1 month in my teens"  Substance Use Topics  . Alcohol use: No    Comment: quit 2000  . Drug use: No     Allergies   Patient has no known allergies.   Review of Systems Review of Systems  All other systems reviewed and are negative.    Physical Exam Updated Vital Signs BP (!) 138/96 (BP Location: Right Arm)   Pulse (!) 50   Temp 98.8 F (37.1 C) (Oral)   Resp 16   Ht 6\' 3"  (1.905 m)   Wt 131.5 kg   SpO2 98%   BMI 36.25 kg/m   Physical Exam Vitals signs and nursing note reviewed.  Constitutional:      General: He is not in acute distress.    Appearance: He is well-developed.  HENT:     Head: Normocephalic and atraumatic.  Eyes:     Conjunctiva/sclera: Conjunctivae normal.     Pupils: Pupils are equal, round, and reactive to  light.  Neck:     Musculoskeletal: Normal range of motion and neck supple.  Cardiovascular:     Rate and Rhythm: Normal rate and regular rhythm.     Heart sounds: Normal heart sounds.  Pulmonary:     Effort: Pulmonary effort is normal. No respiratory distress.     Breath sounds: Normal breath sounds.  Abdominal:     General: There is no distension.     Palpations: Abdomen is soft.     Tenderness: There is no abdominal tenderness.  Musculoskeletal: Normal range of  motion.        General: No deformity.  Skin:    General: Skin is warm and dry.     Comments: Abscess noted to the left axilla.  3 x 3 cm.  Mild surrounding erythema.  Soft fluctuant center of the abscess.  Neurological:     General: No focal deficit present.     Mental Status: He is alert and oriented to person, place, and time. Mental status is at baseline.      ED Treatments / Results  Labs (all labs ordered are listed, but only abnormal results are displayed) Labs Reviewed - No data to display  EKG None  Radiology No results found.  Procedures .Marland KitchenIncision and Drainage Date/Time: 10/28/2018 12:23 PM Performed by: Valarie Merino, MD Authorized by: Valarie Merino, MD   Consent:    Consent obtained:  Verbal   Consent given by:  Patient   Risks discussed:  Bleeding, damage to other organs, incomplete drainage, infection and pain   Alternatives discussed:  No treatment Location:    Type:  Abscess   Size:  3x3   Location:  Upper extremity   Upper extremity location: Left axilla. Pre-procedure details:    Skin preparation:  Chloraprep Anesthesia (see MAR for exact dosages):    Anesthesia method:  Local infiltration   Local anesthetic:  Lidocaine 1% WITH epi Procedure type:    Complexity:  Simple Procedure details:    Needle aspiration: yes     Needle size:  18 G   Incision types:  Stab incision   Incision depth:  Dermal   Scalpel blade:  11   Wound management:  Probed and deloculated   Drainage:   Purulent   Drainage amount:  Moderate   Wound treatment:  Drain placed and wound left open   Packing materials:  1/4 in gauze Post-procedure details:    Patient tolerance of procedure:  Tolerated well, no immediate complications   (including critical care time)  Medications Ordered in ED Medications  sulfamethoxazole-trimethoprim (BACTRIM DS,SEPTRA DS) 800-160 MG per tablet 2 tablet (has no administration in time range)  lidocaine-EPINEPHrine (XYLOCAINE W/EPI) 2 %-1:200000 (PF) injection 10 mL (10 mLs Infiltration Given 10/28/18 1130)     Initial Impression / Assessment and Plan / ED Course  I have reviewed the triage vital signs and the nursing notes.  Pertinent labs & imaging results that were available during my care of the patient were reviewed by me and considered in my medical decision making (see chart for details).     MDM  Screen complete  Patient is presenting for evaluation of left axilla abscess.  This was I&D'd without difficulty.  Patient tolerated procedure well.  Patient given initial dose of Bactrim in the ED.  He will follow-up with his regular care provider as instructed.  Strict return precautions given and understood.  Final Clinical Impressions(s) / ED Diagnoses   Final diagnoses:  Abscess of left axilla    ED Discharge Orders         Ordered    sulfamethoxazole-trimethoprim (BACTRIM DS,SEPTRA DS) 800-160 MG tablet  2 times daily     10/28/18 1214           Valarie Merino, MD 10/28/18 1224

## 2018-10-28 NOTE — ED Notes (Signed)
Patient verbalizes understanding of discharge instructions. Opportunity for questioning and answers were provided. Armband removed by staff, pt discharged from ED. Pt ambulatory to lobby. Dressing chang supplies given.

## 2018-10-28 NOTE — Discharge Instructions (Addendum)
Return for any problem.  Follow-up with your regular care provider as instructed.  Take Bactrim as prescribed.  Your dressing may be removed in 48 hours.  At that time the gauze drain that is in place will likely come out as you removes the dressing.  If it is not a quick tug of the gauze or removed from the skin.  The wound can then be dressed with bacitracin and gauze.  Please return at any time if you feel that you are not improving.

## 2020-12-12 DEATH — deceased
